# Patient Record
Sex: Female | Born: 1935 | Race: White | Hispanic: No | Smoking: Never smoker
Health system: Southern US, Community
[De-identification: ages and names within clinical notes are randomized; demographics above are authoritative.]

## PROBLEM LIST (undated history)

## (undated) DIAGNOSIS — M1990 Unspecified osteoarthritis, unspecified site: Secondary | ICD-10-CM

## (undated) DIAGNOSIS — I10 Essential (primary) hypertension: Secondary | ICD-10-CM

## (undated) HISTORY — PX: ABDOMINAL HYSTERECTOMY: SHX81

---

## 2006-12-25 ENCOUNTER — Inpatient Hospital Stay (HOSPITAL_COMMUNITY): Admission: EM | Admit: 2006-12-25 | Discharge: 2006-12-29 | Payer: Self-pay | Admitting: Emergency Medicine

## 2006-12-25 ENCOUNTER — Encounter: Payer: Self-pay | Admitting: Emergency Medicine

## 2009-10-04 ENCOUNTER — Ambulatory Visit (HOSPITAL_COMMUNITY): Admission: RE | Admit: 2009-10-04 | Discharge: 2009-10-04 | Payer: Self-pay | Admitting: Family Medicine
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
## 2010-12-04 ENCOUNTER — Other Ambulatory Visit (HOSPITAL_COMMUNITY): Payer: Self-pay | Admitting: Internal Medicine

## 2010-12-04 DIAGNOSIS — Z139 Encounter for screening, unspecified: Secondary | ICD-10-CM

## 2010-12-10 ENCOUNTER — Ambulatory Visit (HOSPITAL_COMMUNITY): Payer: Self-pay

## 2011-04-09 LAB — CBC
HCT: 36.3
Hemoglobin: 12.7
MCHC: 33.8
MCV: 91.4
MCV: 91.8
Platelets: 251
Platelets: 290
RBC: 3.97
RBC: 4.07
RBC: 4.16
RDW: 12.4
RDW: 12.6
RDW: 12.8
WBC: 12.1 — ABNORMAL HIGH
WBC: 17 — ABNORMAL HIGH

## 2011-04-09 LAB — BASIC METABOLIC PANEL
BUN: 14
CO2: 22
CO2: 24
Calcium: 8.8
Calcium: 9
Chloride: 104
Chloride: 109
Creatinine, Ser: 0.79
GFR calc Af Amer: >60
GFR calc Af Amer: >60
GFR calc non Af Amer: >60
GFR calc non Af Amer: >60
Glucose, Bld: 283 — ABNORMAL HIGH
Glucose, Bld: 353 — ABNORMAL HIGH
Potassium: 4.5
Potassium: 4.9
Sodium: 134 — ABNORMAL LOW
Sodium: 136

## 2011-04-09 LAB — DIFFERENTIAL
Basophils Absolute: 0.2 — ABNORMAL HIGH
Basophils Relative: 1
Eosinophils Absolute: 0.6
Eosinophils Relative: 0
Eosinophils Relative: 5
Lymphocytes Relative: 17
Lymphs Abs: 0.7
Lymphs Abs: 2
Lymphs Abs: 2.3
Monocytes Absolute: 0.7
Monocytes Relative: 6
Neutrophils Relative %: 74

## 2011-08-15 ENCOUNTER — Encounter (HOSPITAL_COMMUNITY): Payer: Self-pay | Admitting: *Deleted

## 2011-08-15 ENCOUNTER — Emergency Department (HOSPITAL_COMMUNITY): Payer: Medicare Other

## 2011-08-15 ENCOUNTER — Other Ambulatory Visit: Payer: Self-pay

## 2011-08-15 ENCOUNTER — Emergency Department (HOSPITAL_COMMUNITY)
Admission: EM | Admit: 2011-08-15 | Discharge: 2011-08-15 | Disposition: A | Discharge status: Home / Self Care | Payer: Medicare Other | Attending: Emergency Medicine | Admitting: Emergency Medicine

## 2011-08-15 DIAGNOSIS — S20219A Contusion of unspecified front wall of thorax, initial encounter: Secondary | ICD-10-CM | POA: Insufficient documentation

## 2011-08-15 DIAGNOSIS — I10 Essential (primary) hypertension: Secondary | ICD-10-CM | POA: Insufficient documentation

## 2011-08-15 DIAGNOSIS — M129 Arthropathy, unspecified: Secondary | ICD-10-CM | POA: Insufficient documentation

## 2011-08-15 DIAGNOSIS — M542 Cervicalgia: Secondary | ICD-10-CM | POA: Insufficient documentation

## 2011-08-15 DIAGNOSIS — R51 Headache: Secondary | ICD-10-CM | POA: Insufficient documentation

## 2011-08-15 DIAGNOSIS — E119 Type 2 diabetes mellitus without complications: Secondary | ICD-10-CM | POA: Insufficient documentation

## 2011-08-15 HISTORY — DX: Unspecified osteoarthritis, unspecified site: M19.90

## 2011-08-15 HISTORY — DX: Essential (primary) hypertension: I10

## 2011-08-15 LAB — DIFFERENTIAL
Basophils Absolute: 0.1 10*3/uL (ref 0.0–0.1)
Basophils Relative: 0 % (ref 0–1)
Eosinophils Relative: 3 % (ref 0–5)
Lymphocytes Relative: 9 % — ABNORMAL LOW (ref 12–46)
Neutro Abs: 13.2 10*3/uL — ABNORMAL HIGH (ref 1.7–7.7)
Neutrophils Relative %: 82 % — ABNORMAL HIGH (ref 43–77)

## 2011-08-15 LAB — COMPREHENSIVE METABOLIC PANEL
ALT: 20 U/L (ref 0–35)
Albumin: 3.8 g/dL (ref 3.5–5.2)
Alkaline Phosphatase: 75 U/L (ref 39–117)
CO2: 26 mEq/L (ref 19–32)
Creatinine, Ser: 0.75 mg/dL (ref 0.50–1.10)
GFR calc Af Amer: >90 mL/min (ref >90)
GFR calc non Af Amer: 81 mL/min — ABNORMAL LOW (ref >90)
Glucose, Bld: 192 mg/dL — ABNORMAL HIGH (ref 70–99)
Potassium: 3.9 mEq/L (ref 3.5–5.1)
Sodium: 138 mEq/L (ref 135–145)
Total Bilirubin: 0.4 mg/dL (ref 0.3–1.2)

## 2011-08-15 LAB — TROPONIN I: Troponin I: <0.3 ng/mL (ref <0.30)

## 2011-08-15 LAB — CBC
HCT: 36.8 % (ref 36.0–46.0)
Hemoglobin: 12.2 g/dL (ref 12.0–15.0)
Platelets: 256 10*3/uL (ref 150–400)
WBC: 16.2 10*3/uL — ABNORMAL HIGH (ref 4.0–10.5)

## 2011-08-15 MED ORDER — HYDROCODONE-ACETAMINOPHEN 5-325 MG PO TABS: 1.0000 | ORAL_TABLET | Freq: Four times a day (QID) | ORAL | Status: AC | PRN

## 2011-08-15 MED ORDER — MORPHINE SULFATE 2 MG/ML IJ SOLN
2.0000 mg | Freq: Once | INTRAMUSCULAR | Status: AC
  Administered 2011-08-15: 2 mg via INTRAVENOUS
  Filled 2011-08-15: qty 1

## 2011-08-15 MED ORDER — IOHEXOL 300 MG/ML  SOLN
80.0000 mL | Freq: Once | INTRAMUSCULAR | Status: AC | PRN
  Administered 2011-08-15: 80 mL via INTRAVENOUS

## 2011-08-15 NOTE — ED Notes (Signed)
Pt over to x ray.

## 2011-08-15 NOTE — Discharge Instructions (Signed)
Follow up with your md next week for recheck.  Have him follow up on your thyroid gland
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
## 2011-08-15 NOTE — ED Notes (Signed)
Pt stable at discharge.  

## 2011-08-15 NOTE — ED Notes (Signed)
Received report Agree with previous assessment; Pt c/o pain across chest area rating at 10 will inform MD
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
## 2011-08-15 NOTE — ED Notes (Signed)
Family member stated they found the patient's watch stuck under the mattress in the bed. Patient was wearing gold watch upon discharge from ER.

## 2011-08-15 NOTE — ED Notes (Signed)
Patient family member stated they thought she had left her watch in X-Ray. Called xray and they stated they left the watch laying on top of the patient during transport back to room. Searched bed and room with family member, unable to locate watch. Retraced steps back to xray but did not see watch in floor. Advised family member.

## 2011-09-02 ENCOUNTER — Other Ambulatory Visit (HOSPITAL_COMMUNITY): Payer: Self-pay | Admitting: Family Medicine

## 2011-09-02 DIAGNOSIS — E079 Disorder of thyroid, unspecified: Secondary | ICD-10-CM

## 2011-09-10 ENCOUNTER — Ambulatory Visit (HOSPITAL_COMMUNITY)
Admission: RE | Admit: 2011-09-10 | Discharge: 2011-09-10 | Disposition: A | Discharge status: Home / Self Care | Payer: Medicare Other | Source: Ambulatory Visit | Attending: Family Medicine | Admitting: Family Medicine

## 2011-09-10 DIAGNOSIS — E049 Nontoxic goiter, unspecified: Secondary | ICD-10-CM | POA: Insufficient documentation

## 2011-09-10 DIAGNOSIS — E079 Disorder of thyroid, unspecified: Secondary | ICD-10-CM | POA: Insufficient documentation

## 2011-09-26 ENCOUNTER — Other Ambulatory Visit (HOSPITAL_COMMUNITY): Payer: Self-pay | Admitting: "Endocrinology

## 2011-09-26 DIAGNOSIS — E042 Nontoxic multinodular goiter: Secondary | ICD-10-CM

## 2011-09-30 ENCOUNTER — Encounter (HOSPITAL_COMMUNITY): Admission: RE | Admit: 2011-09-30 | Payer: Medicare Other | Source: Ambulatory Visit

## 2011-10-01 ENCOUNTER — Encounter (HOSPITAL_COMMUNITY): Payer: Medicare Other

## 2011-10-03 ENCOUNTER — Encounter (HOSPITAL_COMMUNITY)
Admission: RE | Admit: 2011-10-03 | Discharge: 2011-10-03 | Disposition: A | Discharge status: Home / Self Care | Payer: Medicare Other | Source: Ambulatory Visit | Attending: "Endocrinology | Admitting: "Endocrinology

## 2011-10-03 ENCOUNTER — Encounter (HOSPITAL_COMMUNITY): Payer: Self-pay

## 2011-10-03 DIAGNOSIS — E042 Nontoxic multinodular goiter: Secondary | ICD-10-CM

## 2011-10-03 MED ORDER — SODIUM IODIDE I 131 CAPSULE
17.0000 | Freq: Once | INTRAVENOUS | Status: AC | PRN
  Administered 2011-10-03: 17 via ORAL

## 2011-10-04 ENCOUNTER — Encounter (HOSPITAL_COMMUNITY)
Admission: RE | Admit: 2011-10-04 | Discharge: 2011-10-04 | Disposition: A | Discharge status: Home / Self Care | Payer: Medicare Other | Source: Ambulatory Visit | Attending: "Endocrinology | Admitting: "Endocrinology

## 2011-10-04 MED ORDER — SODIUM PERTECHNETATE TC 99M INJECTION
10.0000 | Freq: Once | INTRAVENOUS | Status: AC | PRN
  Administered 2011-10-04: 10 via INTRAVENOUS

## 2011-10-08 ENCOUNTER — Other Ambulatory Visit (HOSPITAL_COMMUNITY): Payer: Self-pay | Admitting: "Endocrinology

## 2011-10-08 DIAGNOSIS — C540 Malignant neoplasm of isthmus uteri: Secondary | ICD-10-CM

## 2011-10-15 ENCOUNTER — Ambulatory Visit (HOSPITAL_COMMUNITY): Admission: RE | Admit: 2011-10-15 | Payer: Medicare Other | Source: Ambulatory Visit

## 2011-10-18 ENCOUNTER — Other Ambulatory Visit (HOSPITAL_COMMUNITY): Payer: Self-pay | Admitting: "Endocrinology

## 2011-10-18 ENCOUNTER — Ambulatory Visit (HOSPITAL_COMMUNITY)
Admission: RE | Admit: 2011-10-18 | Discharge: 2011-10-18 | Disposition: A | Discharge status: Home / Self Care | Payer: Medicare Other | Source: Ambulatory Visit | Attending: "Endocrinology | Admitting: "Endocrinology

## 2011-10-18 VITALS — BP 190/91 | HR 84 | Temp 97.5°F | Resp 16

## 2011-10-18 DIAGNOSIS — C540 Malignant neoplasm of isthmus uteri: Secondary | ICD-10-CM

## 2011-10-18 DIAGNOSIS — E049 Nontoxic goiter, unspecified: Secondary | ICD-10-CM | POA: Insufficient documentation

## 2011-10-18 NOTE — Progress Notes (Signed)
Lidocaine 2%       3mL injected 

## 2013-03-14 ENCOUNTER — Emergency Department (HOSPITAL_COMMUNITY)
Admission: EM | Admit: 2013-03-14 | Discharge: 2013-03-14 | Disposition: A | Discharge status: Home / Self Care | Payer: Medicare Other | Attending: Emergency Medicine | Admitting: Emergency Medicine

## 2013-03-14 ENCOUNTER — Encounter (HOSPITAL_COMMUNITY): Payer: Self-pay | Admitting: *Deleted

## 2013-03-14 ENCOUNTER — Emergency Department (HOSPITAL_COMMUNITY): Payer: Medicare Other

## 2013-03-14 DIAGNOSIS — Y929 Unspecified place or not applicable: Secondary | ICD-10-CM | POA: Insufficient documentation

## 2013-03-14 DIAGNOSIS — I10 Essential (primary) hypertension: Secondary | ICD-10-CM | POA: Insufficient documentation

## 2013-03-14 DIAGNOSIS — M129 Arthropathy, unspecified: Secondary | ICD-10-CM | POA: Insufficient documentation

## 2013-03-14 DIAGNOSIS — E119 Type 2 diabetes mellitus without complications: Secondary | ICD-10-CM | POA: Insufficient documentation

## 2013-03-14 DIAGNOSIS — Y9301 Activity, walking, marching and hiking: Secondary | ICD-10-CM | POA: Insufficient documentation

## 2013-03-14 DIAGNOSIS — Z79899 Other long term (current) drug therapy: Secondary | ICD-10-CM | POA: Insufficient documentation

## 2013-03-14 DIAGNOSIS — S8002XA Contusion of left knee, initial encounter: Secondary | ICD-10-CM

## 2013-03-14 DIAGNOSIS — S8000XA Contusion of unspecified knee, initial encounter: Secondary | ICD-10-CM | POA: Insufficient documentation

## 2013-03-14 DIAGNOSIS — W010XXA Fall on same level from slipping, tripping and stumbling without subsequent striking against object, initial encounter: Secondary | ICD-10-CM | POA: Insufficient documentation

## 2013-03-14 NOTE — ED Notes (Signed)
Detailed discharge instructions given to the pt and son. Verbalized understanding of all, on scripts given. To car in a wheelchair.

## 2013-03-14 NOTE — ED Notes (Signed)
Pt states that she was walking to the bathroom yesterday evening and fell, pt unsure of what caused her to fall, she had notified family about the fall this am, pt c/o pain to left knee area.

## 2013-03-14 NOTE — ED Provider Notes (Signed)
CSN: [id number]     Arrival date & time 03/14/13  1411 History   This chart was scribed for Hurman Horn, MD by Caryn Bee, ED Scribe. This patient was seen in room APA04/APA04 and the patient's care was started 3:30 PM.    Chief Complaint  Patient presents with  . Fall  . Knee Pain   The history is provided by the patient. No language interpreter was used.   HPI Comments: Yolanda Solis is a 79 y.o. female who presents to the Emergency Department complaining of constant, moderate left knee pain that occurred after a fall yesterday. Pt states that she either tripped or lost her balance while walking to the bathroom, causing the fall. She denies LOC, lightheadedness, dizziness, HA, chest pain, SOB, amnesia, vertigo, focal or lateralizing weakness or numbness pertaining to the fall. Pt lives alone at baseline. She uses a cane at baseline when walking outside of her home, and uses a walker as needed. Pt denies hip pain, back pain, head injury, or neck pain. Pt states she is walking with a limp today. She takes hydrocodone as needed for sciatica pain, but has not taken any medication for pain today.    Past Medical History  Diagnosis Date  . Diabetes mellitus   . Hypertension   . Arthritis    Past Surgical History  Procedure Laterality Date  . Abdominal hysterectomy     No family history on file. History  Substance Use Topics  . Smoking status: Never Smoker   . Smokeless tobacco: Not on file  . Alcohol Use: No   OB History   Grav Para Term Preterm Abortions TAB SAB Ect Mult Living                 Review of Systems 10 Systems reviewed and are negative for acute change except as noted in the HPI.  Allergies  Review of patient's allergies indicates no known allergies.  Home Medications   Current Outpatient Rx  Name  Route  Sig  Dispense  Refill  . glipiZIDE (GLUCOTROL) 10 MG tablet   Oral   Take 10 mg by mouth every morning.         Marland Kitchen HYDROcodone-acetaminophen  (NORCO/VICODIN) 5-325 MG per tablet   Oral   Take 1 tablet by mouth 2 (two) times daily.         Marland Kitchen lisinopril (PRINIVIL,ZESTRIL) 5 MG tablet   Oral   Take 5 mg by mouth daily.         Marland Kitchen loratadine (ALLERGY RELIEF) 10 MG tablet   Oral   Take 10 mg by mouth daily.         . metFORMIN (GLUCOPHAGE) 500 MG tablet   Oral   Take 500 mg by mouth 3 (three) times daily.          Marland Kitchen ibuprofen (ADVIL,MOTRIN) 200 MG tablet   Oral   Take 200 mg by mouth as needed. For pain          Triage Vitals: BP 182/71  Pulse 87  Temp(Src) 98.6 F (37 C) (Oral)  Resp 16  SpO2 100%  Physical Exam  Nursing note and vitals reviewed. Constitutional:  Awake, alert, nontoxic appearance with baseline speech for patient.  HENT:  Head: Atraumatic.  Mouth/Throat: No oropharyngeal exudate.  Eyes: EOM are normal. Pupils are equal, round, and reactive to light. Right eye exhibits no discharge. Left eye exhibits no discharge.  Neck: Neck supple.   Cervical spine non  tender.   Cardiovascular: Normal rate and regular rhythm.   No murmur heard.  Left foot DP pulse intact. CR less than 2 seconds in left toes.   Pulmonary/Chest: Effort normal and breath sounds normal. No stridor. No respiratory distress. She has no wheezes. She has no rales. She exhibits no tenderness.  Abdominal: Soft. Bowel sounds are normal. She exhibits no mass. There is no tenderness. There is no rebound.  Musculoskeletal: She exhibits no tenderness.  Baseline ROM, moves extremities with no obvious new focal weakness.Back non tender. Pelvis and hips non tender. Bilateral arms and right leg non tender. Left hip is non tender with baseline ROM. Left ankle and foot non tender. Left foot normal light touch. Good movement in left foot. Left thigh, left calf non tender. Left knee full extension, thigh and calf non tender. Left knee has bruising, swelling and tenderness anteriorly just inferior to the patella. Decreased flexion due to anterior  knee pain. Left posterior, medial and lateral knee non tender. Left knee stable to varus and valgus stress. Left knee negative Lachman's. Negative McMurray's.   Lymphadenopathy:    She has no cervical adenopathy.  Neurological:  Awake, alert, cooperative and aware of situation; motor strength bilaterally; sensation normal to light touch bilaterally; peripheral visual fields full to confrontation; no facial asymmetry; tongue midline; major cranial nerves appear intact; no pronator drift, normal finger to nose bilaterally, Pt's family reports today has baseline gait without new ataxia.  Skin: No rash noted.  Psychiatric: She has a normal mood and affect.    ED Course  Procedures (including critical care time) DIAGNOSTIC STUDIES: Oxygen Saturation is 100% on room air, normal by my interpretation.    COORDINATION OF CARE: 3:35 PM- Pt advised of plan to receive an X-ray of her left knee. Will apply a knee immobilizer. Pt agrees.  4:25 PM- Pt advised of radiology findings indicating no fractures. Patient / Family / Caregiver informed of clinical course, understand medical decision-making process, and agree with plan. Labs Review Labs Reviewed - No data to display Imaging Review Dg Knee Complete 4 Views Left  03/14/2013   CLINICAL DATA:  Knee pain. Fall  EXAM: LEFT KNEE - COMPLETE 4+ VIEW  COMPARISON:  None  FINDINGS: Prepatellar soft tissue swelling noted.  There is no evidence for acute fracture or subluxation. Tricompartmental osteoarthritis is noted. There is joint space narrowing subchondral sclerosis and marginal spur formation. Vascular calcifications are identified.  IMPRESSION: 1. Osteoarthritis.   Electronically Signed   By: Signa Kell M.D.   On: 03/14/2013 16:10    MDM   1. Knee contusion, left, initial encounter    I doubt any other EMC precluding discharge at this time including, but not necessarily limited to the following:hip injury.  I personally performed the services  described in this documentation, which was scribed in my presence. The recorded information has been reviewed and is accurate.    Hurman Horn, MD 03/15/13 [phone]

## 2013-12-08 IMAGING — US US SOFT TISSUE HEAD/NECK
1 series · 13 of 25 positions shown · non-contrast
Comparison: 08/15/2011 c t neck.  No comparison thyroid
ultrasound.

CLINICAL DATA: Unspecified thyroid disorder.

THYROID ULTRASOUND
TECHNIQUE: Ultrasound examination of the thyroid gland and adjacent
soft tissues was performed.

[Series 1: us soft tissue head/neck · 0.07mm/px · 13 of 49 slices shown]
[im 1/49]
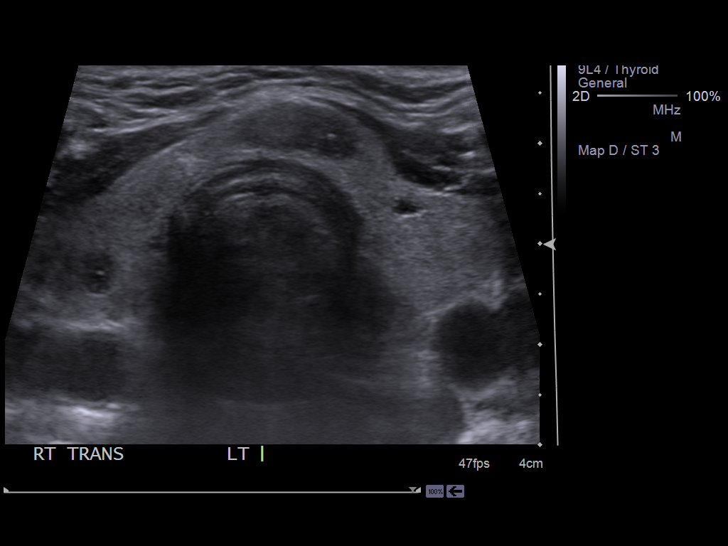
[im 5/49]
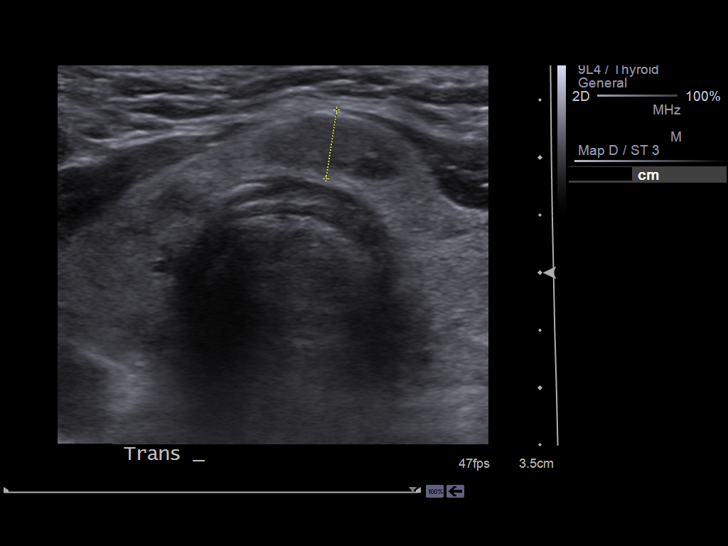
[im 9/49]
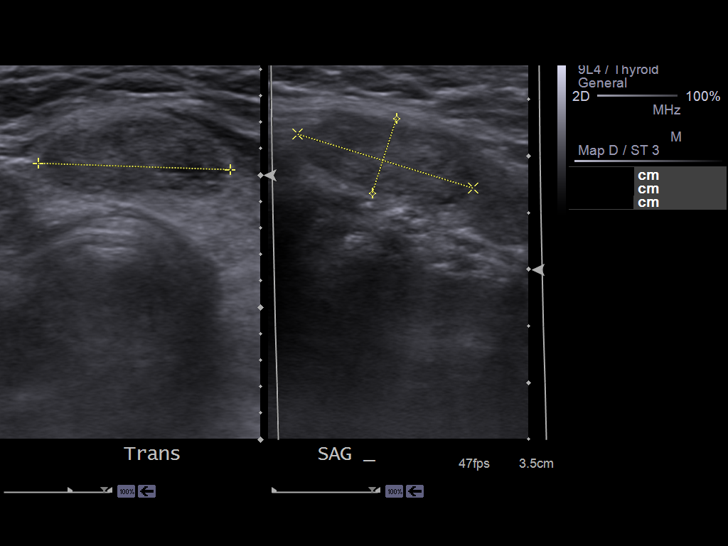
[im 13/49]
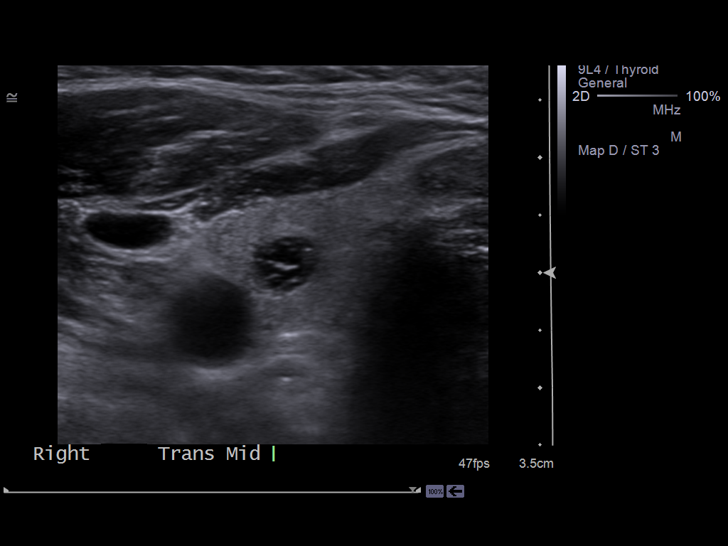
[im 17/49]
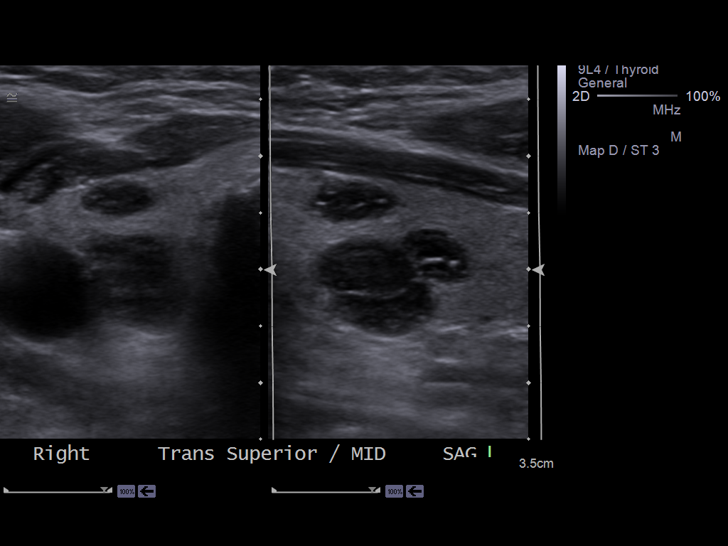
[im 21/49]
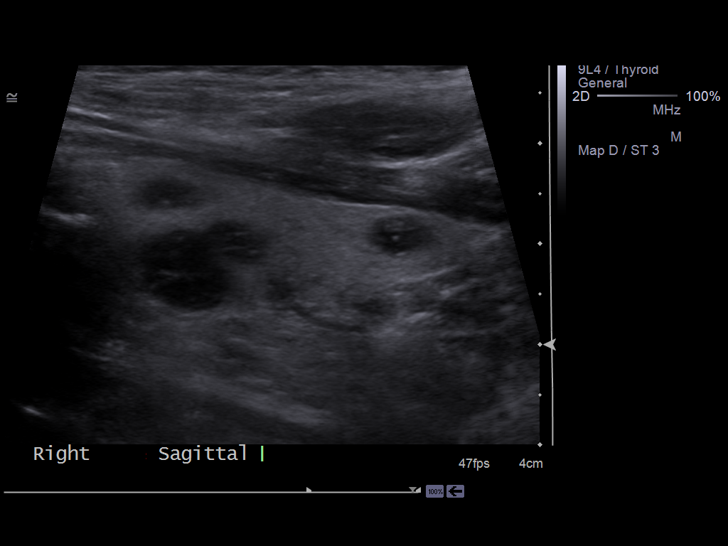
[im 25/49]
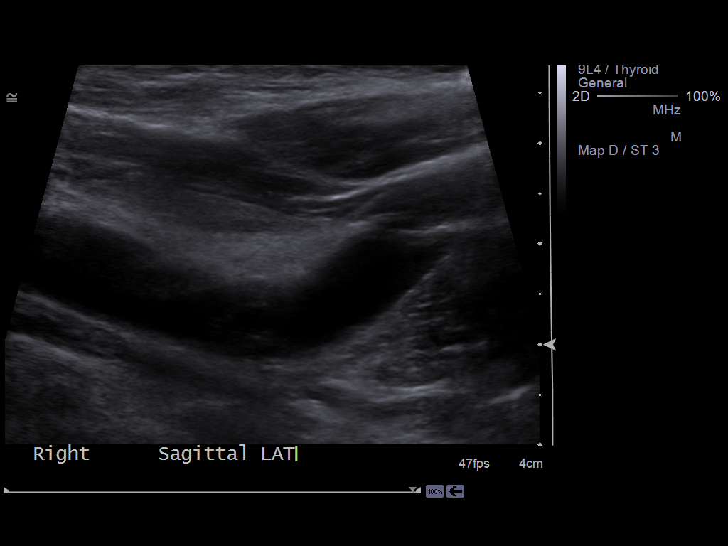
[im 29/49]
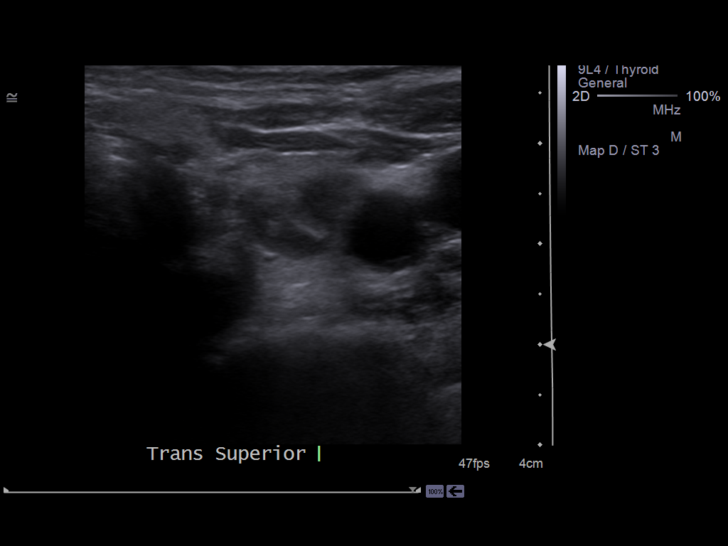
[im 33/49]
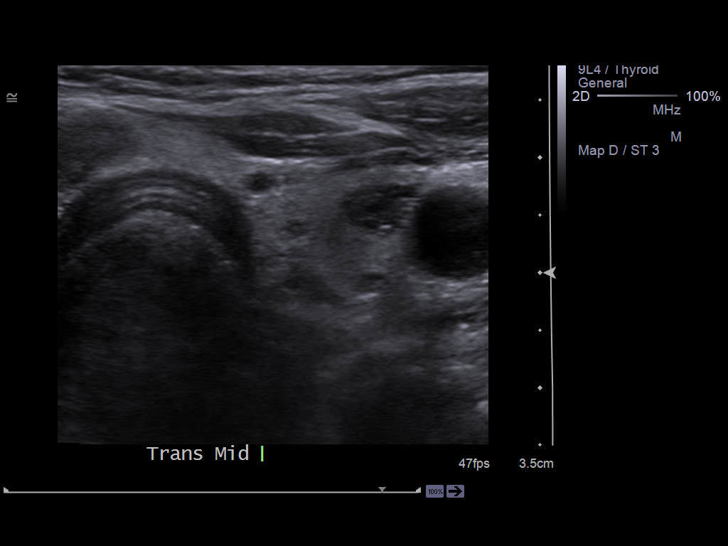
[im 37/49]
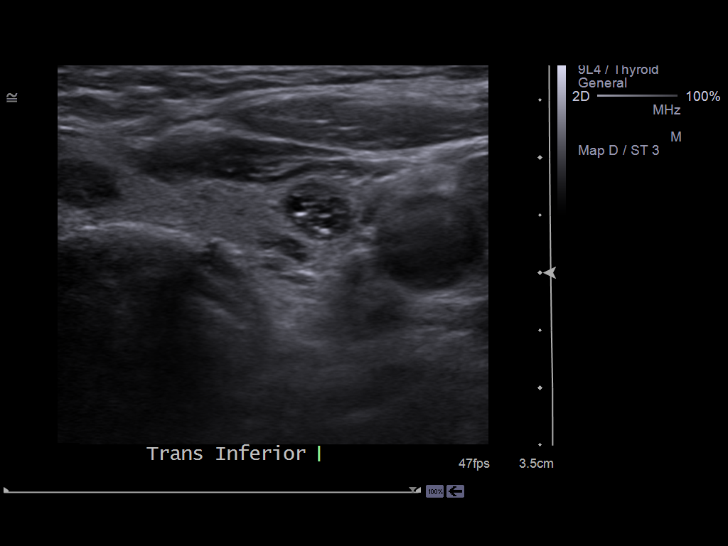
[im 41/49]
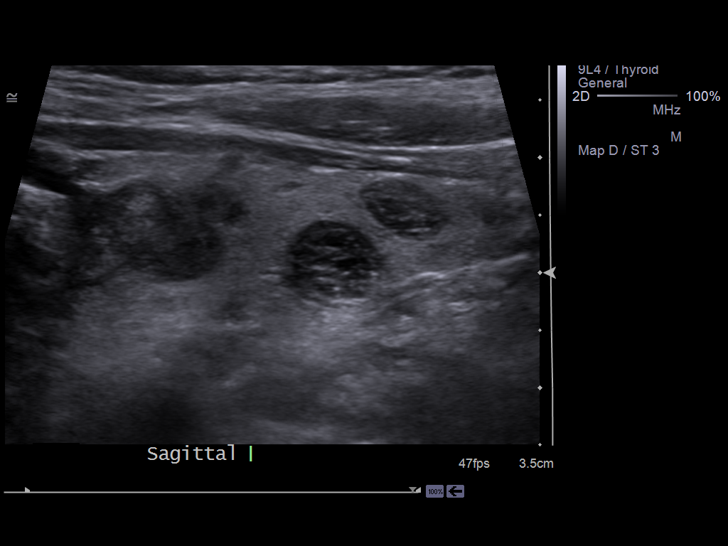
[im 45/49]
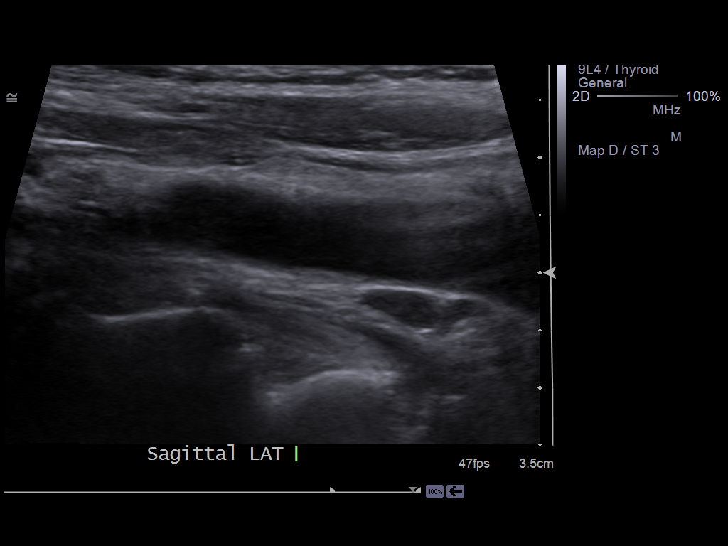
[im 49/49]
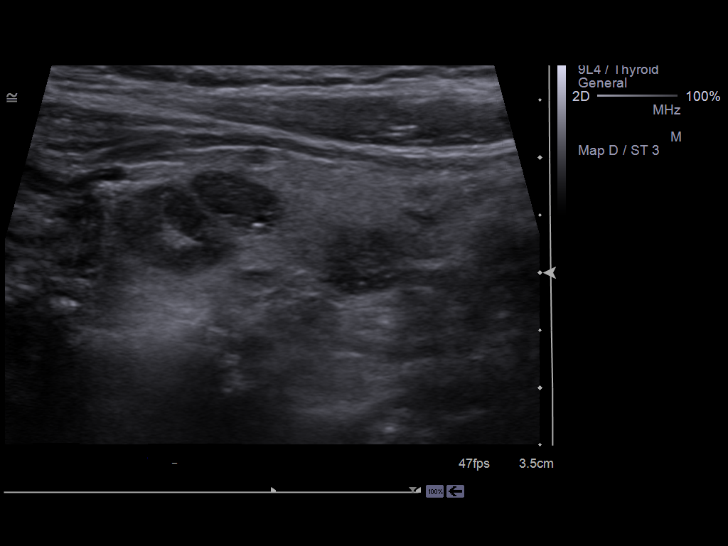

[13 of 25 positions shown; findings below may reference images not displayed]

FINDINGS: Right thyroid lobe:  4.3 x 1.4 x 1.6 cm.
Left thyroid lobe:  3.7 x 1.4 x 1.6 cm.
Isthmus:  0.61 cm.

Focal nodules:  Within the right lobe of the thyroid gland, there
are two lesions.  Upper pole complex partially cystic 0.8 x 0.4 x
0.6 cm lesion.  Below this level is a complex partially septated
and partially cystic appearing 1.1 x 0.9 x 0.9 cm lesion.  Adjacent
to this nodule is a sub centimeter partially cystic complex
structure possibly part of the adjacent nodule.

Within the isthmus, solid masses possibly containing tiny
calcifications.  To the right of midline nodule measures 1.5 x
x 1.4 cm.  To the left of midline, nodule measures 1.6 x 8 and
x 1.5 cm.

Within the left lobe, upper pole complex partially cystic/solid
lesion which may represent two lesions side-by-side versus one
dominant lesion containing small calcifications measuring 1.1 x
x 1.2 cm.  Within the lower pole of the left thyroid complex
cystic/solid structure possibly containing small calcifications
measures 0.9 x 0.7 x 0.6 cm.

Smaller nodules not measured.

Lymphadenopathy:  None visualized.
IMPRESSION: Multiple nodules throughout the thyroid gland with the largest
solid-appearing nodules within the isthmus.  Fine-needle aspirate
may be considered to help exclude malignancy.

## 2014-02-16 ENCOUNTER — Emergency Department (HOSPITAL_COMMUNITY): Payer: Medicare Other

## 2014-02-16 ENCOUNTER — Encounter (HOSPITAL_COMMUNITY): Payer: Self-pay | Admitting: Emergency Medicine

## 2014-02-16 ENCOUNTER — Emergency Department (HOSPITAL_COMMUNITY)
Admission: EM | Admit: 2014-02-16 | Discharge: 2014-02-16 | Disposition: A | Discharge status: Home / Self Care | Payer: Medicare Other | Source: Home / Self Care | Attending: Emergency Medicine | Admitting: Emergency Medicine

## 2014-02-16 DIAGNOSIS — Z791 Long term (current) use of non-steroidal anti-inflammatories (NSAID): Secondary | ICD-10-CM | POA: Insufficient documentation

## 2014-02-16 DIAGNOSIS — S20229A Contusion of unspecified back wall of thorax, initial encounter: Secondary | ICD-10-CM | POA: Insufficient documentation

## 2014-02-16 DIAGNOSIS — Y92009 Unspecified place in unspecified non-institutional (private) residence as the place of occurrence of the external cause: Secondary | ICD-10-CM | POA: Insufficient documentation

## 2014-02-16 DIAGNOSIS — Z79899 Other long term (current) drug therapy: Secondary | ICD-10-CM | POA: Insufficient documentation

## 2014-02-16 DIAGNOSIS — E119 Type 2 diabetes mellitus without complications: Secondary | ICD-10-CM

## 2014-02-16 DIAGNOSIS — W1809XA Striking against other object with subsequent fall, initial encounter: Secondary | ICD-10-CM | POA: Insufficient documentation

## 2014-02-16 DIAGNOSIS — M129 Arthropathy, unspecified: Secondary | ICD-10-CM | POA: Insufficient documentation

## 2014-02-16 DIAGNOSIS — IMO0002 Reserved for concepts with insufficient information to code with codable children: Secondary | ICD-10-CM | POA: Insufficient documentation

## 2014-02-16 DIAGNOSIS — Y9389 Activity, other specified: Secondary | ICD-10-CM | POA: Insufficient documentation

## 2014-02-16 DIAGNOSIS — I10 Essential (primary) hypertension: Secondary | ICD-10-CM

## 2014-02-16 DIAGNOSIS — S300XXA Contusion of lower back and pelvis, initial encounter: Secondary | ICD-10-CM

## 2014-02-16 MED ORDER — FENTANYL CITRATE 0.05 MG/ML IJ SOLN
75.0000 ug | Freq: Once | INTRAMUSCULAR | Status: AC
  Administered 2014-02-16: 75 ug via INTRAMUSCULAR
  Filled 2014-02-16: qty 2

## 2014-02-16 MED ORDER — HYDROMORPHONE HCL PF 1 MG/ML IJ SOLN
1.0000 mg | Freq: Once | INTRAMUSCULAR | Status: AC
  Administered 2014-02-16: 1 mg via INTRAMUSCULAR
  Filled 2014-02-16: qty 1

## 2014-02-16 MED ORDER — OXYCODONE-ACETAMINOPHEN 5-325 MG PO TABS: 1.0000 | ORAL_TABLET | ORAL | Status: DC | PRN

## 2014-02-16 MED ORDER — IBUPROFEN 400 MG PO TABS
400.0000 mg | ORAL_TABLET | Freq: Once | ORAL | Status: AC
  Administered 2014-02-16: 400 mg via ORAL
  Filled 2014-02-16: qty 1

## 2014-02-16 MED ORDER — DIAZEPAM 5 MG PO TABS: 2.5000 mg | ORAL_TABLET | Freq: Three times a day (TID) | ORAL | Status: DC | PRN

## 2014-02-16 MED ORDER — DIAZEPAM 5 MG PO TABS
2.5000 mg | ORAL_TABLET | Freq: Once | ORAL | Status: AC
  Administered 2014-02-16: 2.5 mg via ORAL
  Filled 2014-02-16: qty 1

## 2014-02-16 MED ORDER — DOCUSATE SODIUM 100 MG PO CAPS: 100.0000 mg | ORAL_CAPSULE | Freq: Two times a day (BID) | ORAL | Status: AC
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
## 2014-02-16 NOTE — ED Notes (Signed)
Patient given discharge instruction, verbalized understand.  Patient in wheelchair out of the department.  

## 2014-02-16 NOTE — ED Notes (Signed)
unwitnessed fall at home; a & o x 4 per ems. Pt states lower back hit object in home, no head injury. Pt states pain level "really hurts" NAD noted.

## 2014-02-17 ENCOUNTER — Encounter (HOSPITAL_COMMUNITY)
Admission: EM | Disposition: A | Discharge status: Rehabilitation Facility | Payer: Self-pay | Source: Home / Self Care | Attending: Neurosurgery

## 2014-02-17 ENCOUNTER — Inpatient Hospital Stay (HOSPITAL_COMMUNITY)
Admission: EM | Admit: 2014-02-17 | Discharge: 2014-02-23 | DRG: 028 | Disposition: A | Discharge status: Rehabilitation Facility | Payer: Medicare Other | Attending: Neurosurgery | Admitting: Neurosurgery

## 2014-02-17 ENCOUNTER — Emergency Department (HOSPITAL_COMMUNITY): Payer: Medicare Other

## 2014-02-17 ENCOUNTER — Encounter (HOSPITAL_COMMUNITY): Payer: Self-pay | Admitting: Emergency Medicine

## 2014-02-17 DIAGNOSIS — W19XXXA Unspecified fall, initial encounter: Secondary | ICD-10-CM | POA: Diagnosis present

## 2014-02-17 DIAGNOSIS — G9340 Encephalopathy, unspecified: Secondary | ICD-10-CM | POA: Diagnosis not present

## 2014-02-17 DIAGNOSIS — K592 Neurogenic bowel, not elsewhere classified: Secondary | ICD-10-CM | POA: Diagnosis present

## 2014-02-17 DIAGNOSIS — IMO0002 Reserved for concepts with insufficient information to code with codable children: Secondary | ICD-10-CM | POA: Diagnosis present

## 2014-02-17 DIAGNOSIS — I10 Essential (primary) hypertension: Secondary | ICD-10-CM | POA: Diagnosis present

## 2014-02-17 DIAGNOSIS — Z90710 Acquired absence of both cervix and uterus: Secondary | ICD-10-CM | POA: Diagnosis not present

## 2014-02-17 DIAGNOSIS — D72829 Elevated white blood cell count, unspecified: Secondary | ICD-10-CM | POA: Diagnosis present

## 2014-02-17 DIAGNOSIS — Y92009 Unspecified place in unspecified non-institutional (private) residence as the place of occurrence of the external cause: Secondary | ICD-10-CM

## 2014-02-17 DIAGNOSIS — Z79899 Other long term (current) drug therapy: Secondary | ICD-10-CM | POA: Diagnosis not present

## 2014-02-17 DIAGNOSIS — S22009A Unspecified fracture of unspecified thoracic vertebra, initial encounter for closed fracture: Secondary | ICD-10-CM | POA: Diagnosis present

## 2014-02-17 DIAGNOSIS — M129 Arthropathy, unspecified: Secondary | ICD-10-CM | POA: Diagnosis present

## 2014-02-17 DIAGNOSIS — IMO0001 Reserved for inherently not codable concepts without codable children: Secondary | ICD-10-CM

## 2014-02-17 DIAGNOSIS — R6510 Systemic inflammatory response syndrome (SIRS) of non-infectious origin without acute organ dysfunction: Secondary | ICD-10-CM | POA: Diagnosis present

## 2014-02-17 DIAGNOSIS — N319 Neuromuscular dysfunction of bladder, unspecified: Secondary | ICD-10-CM | POA: Diagnosis present

## 2014-02-17 DIAGNOSIS — E1149 Type 2 diabetes mellitus with other diabetic neurological complication: Secondary | ICD-10-CM | POA: Diagnosis present

## 2014-02-17 DIAGNOSIS — E119 Type 2 diabetes mellitus without complications: Secondary | ICD-10-CM

## 2014-02-17 DIAGNOSIS — S24109A Unspecified injury at unspecified level of thoracic spinal cord, initial encounter: Secondary | ICD-10-CM

## 2014-02-17 DIAGNOSIS — I4891 Unspecified atrial fibrillation: Secondary | ICD-10-CM | POA: Diagnosis present

## 2014-02-17 DIAGNOSIS — E1142 Type 2 diabetes mellitus with diabetic polyneuropathy: Secondary | ICD-10-CM | POA: Diagnosis present

## 2014-02-17 DIAGNOSIS — S22009K Unspecified fracture of unspecified thoracic vertebra, subsequent encounter for fracture with nonunion: Secondary | ICD-10-CM

## 2014-02-17 DIAGNOSIS — E1165 Type 2 diabetes mellitus with hyperglycemia: Secondary | ICD-10-CM

## 2014-02-17 HISTORY — PX: POSTERIOR LUMBAR FUSION 4 LEVEL: SHX6037

## 2014-02-17 LAB — BASIC METABOLIC PANEL
Anion gap: 18 — ABNORMAL HIGH (ref 5–15)
BUN: 19 mg/dL (ref 6–23)
CO2: 24 meq/L (ref 19–32)
CREATININE: 0.82 mg/dL (ref 0.50–1.10)
Calcium: 9.3 mg/dL (ref 8.4–10.5)
Chloride: 95 mEq/L — ABNORMAL LOW (ref 96–112)
GFR calc Af Amer: 78 mL/min — ABNORMAL LOW (ref >90)
GFR calc non Af Amer: 67 mL/min — ABNORMAL LOW (ref >90)
GLUCOSE: 275 mg/dL — AB (ref 70–99)
Potassium: 3.9 mEq/L (ref 3.7–5.3)
Sodium: 137 mEq/L (ref 137–147)

## 2014-02-17 LAB — CBC WITH DIFFERENTIAL/PLATELET
BASOS ABS: 0 10*3/uL (ref 0.0–0.1)
BASOS PCT: 0 % (ref 0–1)
EOS ABS: 0 10*3/uL (ref 0.0–0.7)
Eosinophils Relative: 0 % (ref 0–5)
HEMATOCRIT: 40.1 % (ref 36.0–46.0)
HEMOGLOBIN: 13.4 g/dL (ref 12.0–15.0)
LYMPHS ABS: 1.4 10*3/uL (ref 0.7–4.0)
Lymphocytes Relative: 7 % — ABNORMAL LOW (ref 12–46)
MCH: 30.6 pg (ref 26.0–34.0)
MCHC: 33.4 g/dL (ref 30.0–36.0)
MCV: 91.6 fL (ref 78.0–100.0)
MONOS PCT: 4 % (ref 3–12)
Monocytes Absolute: 0.8 10*3/uL (ref 0.1–1.0)
NEUTROS ABS: 18 10*3/uL — AB (ref 1.7–7.7)
Neutrophils Relative %: 89 % — ABNORMAL HIGH (ref 43–77)
Platelets: 223 10*3/uL (ref 150–400)
RBC: 4.38 MIL/uL (ref 3.87–5.11)
RDW: 13.4 % (ref 11.5–15.5)
WBC: 20.2 10*3/uL — ABNORMAL HIGH (ref 4.0–10.5)

## 2014-02-17 LAB — URINE MICROSCOPIC-ADD ON

## 2014-02-17 LAB — URINALYSIS, ROUTINE W REFLEX MICROSCOPIC
Bilirubin Urine: NEGATIVE
GLUCOSE, UA: 500 mg/dL — AB
Ketones, ur: 15 mg/dL — AB
Leukocytes, UA: NEGATIVE
Nitrite: NEGATIVE
PROTEIN: 100 mg/dL — AB
Specific Gravity, Urine: >1.03 — ABNORMAL HIGH (ref 1.005–1.030)
Urobilinogen, UA: 0.2 mg/dL (ref 0.0–1.0)
pH: 5 (ref 5.0–8.0)

## 2014-02-17 SURGERY — POSTERIOR LUMBAR FUSION 4 LEVEL
Anesthesia: General | Site: Back

## 2014-02-17 MED ORDER — SODIUM CHLORIDE 0.9 % IV SOLN: INTRAVENOUS | Status: DC | PRN

## 2014-02-17 MED ORDER — ONDANSETRON HCL 4 MG/2ML IJ SOLN
INTRAMUSCULAR | Status: AC
  Filled 2014-02-17: qty 2

## 2014-02-17 MED ORDER — HYDROMORPHONE HCL PF 1 MG/ML IJ SOLN
0.5000 mg | Freq: Once | INTRAMUSCULAR | Status: AC
  Administered 2014-02-17: 0.5 mg via INTRAVENOUS
  Filled 2014-02-17: qty 1

## 2014-02-17 MED ORDER — CETYLPYRIDINIUM CHLORIDE 0.05 % MT LIQD
7.0000 mL | Freq: Two times a day (BID) | OROMUCOSAL | Status: DC
  Administered 2014-02-17 – 2014-02-23 (×12): 7 mL via OROMUCOSAL

## 2014-02-17 MED ORDER — ONDANSETRON HCL 4 MG/2ML IJ SOLN
4.0000 mg | Freq: Once | INTRAMUSCULAR | Status: AC
  Administered 2014-02-17: 4 mg via INTRAVENOUS

## 2014-02-17 MED ORDER — DILTIAZEM HCL 100 MG IV SOLR
5.0000 mg/h | INTRAVENOUS | Status: DC
  Administered 2014-02-17: 5 mg/h via INTRAVENOUS
  Filled 2014-02-17: qty 100

## 2014-02-17 MED ORDER — HYDROMORPHONE HCL PF 1 MG/ML IJ SOLN
1.0000 mg | Freq: Once | INTRAMUSCULAR | Status: AC
  Administered 2014-02-17: 1 mg via INTRAVENOUS
  Filled 2014-02-17: qty 1

## 2014-02-17 SURGICAL SUPPLY — 82 items
BAG DECANTER FOR FLEXI CONT (MISCELLANEOUS) ×3 IMPLANT
BENZOIN TINCTURE PRP APPL 2/3 (GAUZE/BANDAGES/DRESSINGS) IMPLANT
BLADE SURG ROTATE 9660 (MISCELLANEOUS) IMPLANT
BUR MATCHSTICK NEURO 3.0 LAGG (BURR) ×3 IMPLANT
BUR PRECISION FLUTE 5.0 (BURR) ×3 IMPLANT
CANISTER SUCT 3000ML (MISCELLANEOUS) ×3 IMPLANT
CLOSURE WOUND 1/2 X4 (GAUZE/BANDAGES/DRESSINGS) ×1
CONT SPEC 4OZ CLIKSEAL STRL BL (MISCELLANEOUS) ×6 IMPLANT
COVER BACK TABLE 24X17X13 BIG (DRAPES) IMPLANT
COVER TABLE BACK 60X90 (DRAPES) ×3 IMPLANT
DERMABOND ADHESIVE PROPEN (GAUZE/BANDAGES/DRESSINGS) ×4
DERMABOND ADVANCED (GAUZE/BANDAGES/DRESSINGS) ×2
DERMABOND ADVANCED .7 DNX12 (GAUZE/BANDAGES/DRESSINGS) ×1 IMPLANT
DERMABOND ADVANCED .7 DNX6 (GAUZE/BANDAGES/DRESSINGS) ×2 IMPLANT
DRAPE C-ARM 42X72 X-RAY (DRAPES) ×6 IMPLANT
DRAPE LAPAROTOMY 100X72X124 (DRAPES) ×3 IMPLANT
DRAPE POUCH INSTRU U-SHP 10X18 (DRAPES) ×3 IMPLANT
DRAPE SURG 17X23 STRL (DRAPES) ×3 IMPLANT
DRSG OPSITE POSTOP 4X8 (GAUZE/BANDAGES/DRESSINGS) ×3 IMPLANT
DRSG TELFA 3X8 NADH (GAUZE/BANDAGES/DRESSINGS) IMPLANT
DURAPREP 26ML APPLICATOR (WOUND CARE) ×3 IMPLANT
ELECT REM PT RETURN 9FT ADLT (ELECTROSURGICAL) ×3
ELECTRODE REM PT RTRN 9FT ADLT (ELECTROSURGICAL) ×1 IMPLANT
EVACUATOR 1/8 PVC DRAIN (DRAIN) IMPLANT
GAUZE SPONGE 4X4 12PLY STRL (GAUZE/BANDAGES/DRESSINGS) ×3 IMPLANT
GAUZE SPONGE 4X4 16PLY XRAY LF (GAUZE/BANDAGES/DRESSINGS) IMPLANT
GLOVE BIO SURGEON STRL SZ8 (GLOVE) ×6 IMPLANT
GLOVE BIOGEL PI IND STRL 7.5 (GLOVE) ×1 IMPLANT
GLOVE BIOGEL PI IND STRL 8 (GLOVE) IMPLANT
GLOVE BIOGEL PI IND STRL 8.5 (GLOVE) ×2 IMPLANT
GLOVE BIOGEL PI INDICATOR 7.5 (GLOVE) ×2
GLOVE BIOGEL PI INDICATOR 8 (GLOVE)
GLOVE BIOGEL PI INDICATOR 8.5 (GLOVE) ×4
GLOVE ECLIPSE 8.0 STRL XLNG CF (GLOVE) IMPLANT
GLOVE EXAM NITRILE LRG STRL (GLOVE) IMPLANT
GLOVE EXAM NITRILE MD LF STRL (GLOVE) IMPLANT
GLOVE EXAM NITRILE XL STR (GLOVE) IMPLANT
GLOVE EXAM NITRILE XS STR PU (GLOVE) IMPLANT
GLOVE SS N UNI LF 7.5 STRL (GLOVE) ×9 IMPLANT
GOWN STRL REUS W/ TWL LRG LVL3 (GOWN DISPOSABLE) IMPLANT
GOWN STRL REUS W/ TWL XL LVL3 (GOWN DISPOSABLE) ×2 IMPLANT
GOWN STRL REUS W/TWL 2XL LVL3 (GOWN DISPOSABLE) IMPLANT
GOWN STRL REUS W/TWL LRG LVL3 (GOWN DISPOSABLE)
GOWN STRL REUS W/TWL XL LVL3 (GOWN DISPOSABLE) ×4
KIT BASIN OR (CUSTOM PROCEDURE TRAY) ×3 IMPLANT
KIT INFUSE MEDIUM (Orthopedic Implant) ×3 IMPLANT
KIT POSITION SURG JACKSON T1 (MISCELLANEOUS) ×3 IMPLANT
KIT ROOM TURNOVER OR (KITS) ×3 IMPLANT
MILL MEDIUM DISP (BLADE) ×3 IMPLANT
NEEDLE HYPO 25X1 1.5 SAFETY (NEEDLE) ×3 IMPLANT
NEEDLE SPNL 18GX3.5 QUINCKE PK (NEEDLE) IMPLANT
NS IRRIG 1000ML POUR BTL (IV SOLUTION) ×3 IMPLANT
PACK FOAM VITOSS 10CC (Orthopedic Implant) ×6 IMPLANT
PACK LAMINECTOMY NEURO (CUSTOM PROCEDURE TRAY) ×3 IMPLANT
PAD ARMBOARD 7.5X6 YLW CONV (MISCELLANEOUS) ×9 IMPLANT
PATTIES SURGICAL .5 X.5 (GAUZE/BANDAGES/DRESSINGS) IMPLANT
PATTIES SURGICAL .5 X1 (DISPOSABLE) IMPLANT
PATTIES SURGICAL 1X1 (DISPOSABLE) IMPLANT
ROD 200MM (Rod) ×6 IMPLANT
ROD SPNL 200X5.5XNS LF TI (Rod) ×3 IMPLANT
SCREW LOCK (Screw) ×16 IMPLANT
SCREW LOCK 100X5.5X OPN (Screw) ×8 IMPLANT
SCREW POLY 45X6.5 (Screw) ×4 IMPLANT
SCREW POLY 5.5X40MM (Screw) ×6 IMPLANT
SCREW POLY 5.5X45MM (Screw) ×6 IMPLANT
SCREW POLY 6.5X45MM (Screw) ×8 IMPLANT
SPONGE LAP 4X18 X RAY DECT (DISPOSABLE) IMPLANT
SPONGE SURGIFOAM ABS GEL 100 (HEMOSTASIS) ×3 IMPLANT
STAPLER SKIN PROX WIDE 3.9 (STAPLE) IMPLANT
STRIP CLOSURE SKIN 1/2X4 (GAUZE/BANDAGES/DRESSINGS) ×2 IMPLANT
SUT VIC AB 1 CT1 18XBRD ANBCTR (SUTURE) ×2 IMPLANT
SUT VIC AB 1 CT1 8-18 (SUTURE) ×4
SUT VIC AB 2-0 CT1 18 (SUTURE) ×6 IMPLANT
SUT VIC AB 3-0 SH 8-18 (SUTURE) ×6 IMPLANT
SYR 20CC LL (SYRINGE) ×3 IMPLANT
SYR 3ML LL SCALE MARK (SYRINGE) ×12 IMPLANT
SYR 5ML LL (SYRINGE) IMPLANT
TOWEL OR 17X24 6PK STRL BLUE (TOWEL DISPOSABLE) ×3 IMPLANT
TOWEL OR 17X26 10 PK STRL BLUE (TOWEL DISPOSABLE) ×3 IMPLANT
TRAP SPECIMEN MUCOUS 40CC (MISCELLANEOUS) ×3 IMPLANT
TRAY FOLEY CATH 14FRSI W/METER (CATHETERS) ×3 IMPLANT
WATER STERILE IRR 1000ML POUR (IV SOLUTION) ×3 IMPLANT

## 2014-02-17 NOTE — ED Notes (Signed)
ems reports pt was evaluated yesterday for a fall.  Today here for c/o pain and numbness in legs.

## 2014-02-17 NOTE — ED Provider Notes (Signed)
CSN: [id number]     Arrival date & time 02/17/14  1827 History   First MD Initiated Contact with Patient 02/17/14 1906     Chief Complaint  Patient presents with  . Numbness  . Leg Pain     Patient is a 80 y.o. female presenting with back pain. The history is provided by the patient and a relative.  Back Pain Location:  Lumbar spine Quality:  Aching Pain severity:  Severe Onset quality:  Sudden Duration:  1 day Timing:  Constant Progression:  Worsening Chronicity:  Recurrent Context: falling   Relieved by:  Bed rest Worsened by:  Movement Associated symptoms: numbness and weakness   Associated symptoms: no bladder incontinence, no bowel incontinence, no chest pain and no fever   Pt was seen in ED yesterday after mechanical fall.  She reports losing balance and fell and landed on her low back (no head or neck injury, no LOC) She was seen in ED, had negative xray and felt well and was ambulatory Today she noted worsening pain in her low back, and pain was so severe she had difficulty walking or even getting up due to pain EMS was called for transport and she then noticed numbness/weakness in her legs. She also reports brief episode of abdominal pain that has since improved No neck pain. No cp.  She reports at rest her only pain is in her low back   Past Medical History  Diagnosis Date  . Diabetes mellitus   . Hypertension   . Arthritis    Past Surgical History  Procedure Laterality Date  . Abdominal hysterectomy     No family history on file. History  Substance Use Topics  . Smoking status: Never Smoker   . Smokeless tobacco: Not on file  . Alcohol Use: No   OB History   Grav Para Term Preterm Abortions TAB SAB Ect Mult Living                 Review of Systems  Constitutional: Negative for fever.  Cardiovascular: Negative for chest pain.  Gastrointestinal: Negative for vomiting and bowel incontinence.  Genitourinary: Negative for bladder incontinence.   Musculoskeletal: Positive for back pain. Negative for neck pain.  Neurological: Positive for weakness and numbness.  All other systems reviewed and are negative.     Allergies  Review of patient's allergies indicates no known allergies.  Home Medications   Prior to Admission medications   Medication Sig Start Date End Date Taking? Authorizing Provider  diazepam (VALIUM) 5 MG tablet Take 0.5 tablets (2.5 mg total) by mouth every 8 (eight) hours as needed for anxiety. 02/16/14  Yes Virgel Manifold, MD  lisinopril (PRINIVIL,ZESTRIL) 5 MG tablet Take 5 mg by mouth daily.   Yes Historical Provider, MD  loratadine (ALLERGY RELIEF) 10 MG tablet Take 10 mg by mouth daily.   Yes Historical Provider, MD  metFORMIN (GLUCOPHAGE) 500 MG tablet Take 500 mg by mouth 2 (two) times daily with a meal.    Yes Historical Provider, MD  oxyCODONE-acetaminophen (PERCOCET/ROXICET) 5-325 MG per tablet Take 1-2 tablets by mouth every 4 (four) hours as needed for severe pain. 02/16/14  Yes Virgel Manifold, MD  docusate sodium (COLACE) 100 MG capsule Take 1 capsule (100 mg total) by mouth every 12 (twelve) hours. 02/16/14   Virgel Manifold, MD  ibuprofen (ADVIL,MOTRIN) 200 MG tablet Take 400 mg by mouth at bedtime.     Historical Provider, MD   BP 156/102  Pulse 118  Temp(Src) 98.2 F (36.8 C) (Oral)  Resp 20  Ht 5\' 4"  (1.626 m)  Wt 200 lb (90.719 kg)  BMI 34.31 kg/m2  SpO2 88% Physical Exam CONSTITUTIONAL: elderly HEAD: Normocephalic/atraumatic EYES: EOMI/PERRL ENMT: Mucous membranes moist NECK: supple no meningeal signs SPINE: no cervical or thoracic tenderness. Lumbar tenderness noted Patient maintained in spinal precautions/logroll utilized Exam limited due to body habitus CV: irregular, tachycardic LUNGS: Lungs are clear to auscultation bilaterally, no apparent distress ABDOMEN: soft, obese, no focal tenderness or bruising noted NEURO: Pt is awake/alert, no weakness is noted in either upper  extremity She has paralysis of bilateral LE (She is unable to resist gravity when leg is lifted on either side) She has complete sensory deficit to painful stimuli in lower extremities which terminates at suprapubic region She has no saddle anesthesia and +rectal tone EXTREMITIES: pulses normal/equal in femoral/DP pulses no deformity to lower extremities SKIN: warm, color normal PSYCH: anxious  ED Course  Procedures  CRITICAL CARE Performed by: Sharyon Cable Total critical care time: 33 Critical care time was exclusive of separately billable procedures and treating other patients. Critical care was necessary to treat or prevent imminent or life-threatening deterioration. Critical care was time spent personally by me on the following activities: development of treatment plan with patient and/or surrogate as well as nursing, discussions with consultants, evaluation of patient's response to treatment, examination of patient, obtaining history from patient or surrogate, ordering and performing treatments and interventions, ordering and review of laboratory studies, ordering and review of radiographic studies, pulse oximetry and re-evaluation of patient's condition.   9:02 PM Concern for spinal injury since pt with back pain and paralysis of both legs She has gone for emergent MRI of thoracic spine (lumbar spine negative yesterday) 9:48 PM D/w dr Vertell Limber with neurosurgery He has reviewed MRI He will accept in [facility] Requests CT imaging of T/L spine if possible prior to transport No meds requested prior to transport Will keep in TL precautions Pt denies CP.  She denies neck/cervical pain She has no deformity to legs (she had been ambulatory) No other signs of traumatic injury  9:50 PM Pt/family updated on plan Will start cardizem due to afib with RVR (pt was unaware she had this condition) BP 162/98  Pulse 125  Temp(Src) 98.2 F (36.8 C) (Oral)  Resp 18  Ht 5'  4" (1.626 m)  Wt 200 lb (90.719 kg)  BMI 34.31 kg/m2  SpO2 95%   Labs Review Labs Reviewed  BASIC METABOLIC PANEL - Abnormal; Notable for the following:    Chloride 95 (*)    Glucose, Bld 275 (*)    GFR calc non Af Amer 67 (*)    GFR calc Af Amer 78 (*)    Anion gap 18 (*)    All other components within normal limits  CBC WITH DIFFERENTIAL - Abnormal; Notable for the following:    WBC 20.2 (*)    Neutrophils Relative % 89 (*)    Lymphocytes Relative 7 (*)    Neutro Abs 18.0 (*)    All other components within normal limits  URINE CULTURE  URINALYSIS, ROUTINE W REFLEX MICROSCOPIC    Imaging Review Dg Lumbar Spine Complete  02/16/2014   CLINICAL DATA:  80 year old female with low back pain after a fall. Initial encounter.  EXAM: LUMBAR SPINE - COMPLETE 4+ VIEW  COMPARISON:  CT Abdomen and Pelvis 12/25/2006.  FINDINGS: Progressed and bulky diffuse endplate spurring throughout the lumbar spine. Normal  lumbar segmentation. Chronic vacuum disc phenomena at L4-L5. No lumbar compression fracture identified. Grossly stable and intact visualized lower thoracic levels. Moderate lower lumbar facet hypertrophy. No pars fracture identified. Extensive Aortoiliac calcified atherosclerosis noted. Grossly intact sacral ala and SI joints.  IMPRESSION: 1.  No acute fracture or listhesis identified in the lumbar spine. 2. Diffuse idiopathic skeletal hyperostosis 3. Chronically advanced L4-L5 disc degeneration.   Electronically Signed   By: Lars Pinks M.D.   On: 02/16/2014 14:46     EKG Interpretation   Date/Time:  Thursday February 17 2014 21:26:17 EDT Ventricular Rate:  111 PR Interval:    QRS Duration: 80 QT Interval:  366 QTC Calculation: 497 R Axis:   55 Text Interpretation:  Atrial fibrillation Borderline T abnormalities,  inferior leads Borderline prolonged QT interval pvc noted changed from  prior Confirmed by [place] ([id number]) on 02/17/2014 9:47:19 PM      MDM   Final  diagnoses:  Fracture of thoracic vertebra with spinal cord injury, initial encounter  Atrial fibrillation, unspecified    Nursing notes including past medical history and social history reviewed and considered in documentation Labs/vital reviewed and considered Previous records reviewed and considered     Sharyon Cable, MD 02/17/14 2152

## 2014-02-17 NOTE — Anesthesia Preprocedure Evaluation (Addendum)
Anesthesia Evaluation  Patient identified by MRN, date of birth, ID band Patient awake    Reviewed: Allergy & Precautions, H&P , NPO status , Patient's Chart, lab work & pertinent test results  History of Anesthesia Complications Negative for: history of anesthetic complications  Airway Mallampati: I TM Distance: >3 FB Neck ROM: Full    Dental  (+) Edentulous Upper, Edentulous Lower   Pulmonary neg pulmonary ROS,    Pulmonary exam normal       Cardiovascular hypertension, Pt. on medications + dysrhythmias Atrial Fibrillation Rhythm:Irregular Rate:Tachycardia     Neuro/Psych negative psych ROS   GI/Hepatic negative GI ROS, Neg liver ROS,   Endo/Other  diabetes  Renal/GU negative Renal ROS     Musculoskeletal   Abdominal   Peds  Hematology   Anesthesia Other Findings   Reproductive/Obstetrics                          Anesthesia Physical Anesthesia Plan  ASA: III  Anesthesia Plan: General   Post-op Pain Management:    Induction: Intravenous  Airway Management Planned: Oral ETT  Additional Equipment: Arterial line  Intra-op Plan:   Post-operative Plan: Possible Post-op intubation/ventilation  Informed Consent: I have reviewed the patients History and Physical, chart, labs and discussed the procedure including the risks, benefits and alternatives for the proposed anesthesia with the patient or authorized representative who has indicated his/her understanding and acceptance.   Dental advisory given  Plan Discussed with: CRNA and Anesthesiologist  Anesthesia Plan Comments:        Anesthesia Quick Evaluation

## 2014-02-18 ENCOUNTER — Encounter (HOSPITAL_COMMUNITY): Payer: Medicare Other | Admitting: Anesthesiology

## 2014-02-18 ENCOUNTER — Encounter (HOSPITAL_COMMUNITY): Payer: Self-pay | Admitting: Certified Registered"

## 2014-02-18 ENCOUNTER — Inpatient Hospital Stay (HOSPITAL_COMMUNITY): Payer: Medicare Other

## 2014-02-18 ENCOUNTER — Inpatient Hospital Stay (HOSPITAL_COMMUNITY): Payer: Medicare Other | Admitting: Anesthesiology

## 2014-02-18 DIAGNOSIS — IMO0001 Reserved for inherently not codable concepts without codable children: Secondary | ICD-10-CM

## 2014-02-18 DIAGNOSIS — I4891 Unspecified atrial fibrillation: Secondary | ICD-10-CM

## 2014-02-18 DIAGNOSIS — E1165 Type 2 diabetes mellitus with hyperglycemia: Secondary | ICD-10-CM

## 2014-02-18 DIAGNOSIS — I10 Essential (primary) hypertension: Secondary | ICD-10-CM

## 2014-02-18 DIAGNOSIS — IMO0002 Reserved for concepts with insufficient information to code with codable children: Principal | ICD-10-CM

## 2014-02-18 DIAGNOSIS — G8220 Paraplegia, unspecified: Secondary | ICD-10-CM

## 2014-02-18 DIAGNOSIS — E119 Type 2 diabetes mellitus without complications: Secondary | ICD-10-CM

## 2014-02-18 DIAGNOSIS — W19XXXA Unspecified fall, initial encounter: Secondary | ICD-10-CM

## 2014-02-18 DIAGNOSIS — S22009A Unspecified fracture of unspecified thoracic vertebra, initial encounter for closed fracture: Secondary | ICD-10-CM | POA: Diagnosis present

## 2014-02-18 LAB — GLUCOSE, CAPILLARY
GLUCOSE-CAPILLARY: 290 mg/dL — AB (ref 70–99)
GLUCOSE-CAPILLARY: 250 mg/dL — AB (ref 70–99)
GLUCOSE-CAPILLARY: 165 mg/dL — AB (ref 70–99)
GLUCOSE-CAPILLARY: 142 mg/dL — AB (ref 70–99)
GLUCOSE-CAPILLARY: 121 mg/dL — AB (ref 70–99)
Glucose-Capillary: 263 mg/dL — ABNORMAL HIGH (ref 70–99)
Glucose-Capillary: 293 mg/dL — ABNORMAL HIGH (ref 70–99)
Glucose-Capillary: 300 mg/dL — ABNORMAL HIGH (ref 70–99)
Glucose-Capillary: 292 mg/dL — ABNORMAL HIGH (ref 70–99)
Glucose-Capillary: 286 mg/dL — ABNORMAL HIGH (ref 70–99)
Glucose-Capillary: 152 mg/dL — ABNORMAL HIGH (ref 70–99)
Glucose-Capillary: 154 mg/dL — ABNORMAL HIGH (ref 70–99)
Glucose-Capillary: 173 mg/dL — ABNORMAL HIGH (ref 70–99)
Glucose-Capillary: 147 mg/dL — ABNORMAL HIGH (ref 70–99)

## 2014-02-18 LAB — CBC
HCT: 35.8 % — ABNORMAL LOW (ref 36.0–46.0)
Hemoglobin: 11.5 g/dL — ABNORMAL LOW (ref 12.0–15.0)
MCH: 29.8 pg (ref 26.0–34.0)
MCHC: 32.1 g/dL (ref 30.0–36.0)
MCV: 92.7 fL (ref 78.0–100.0)
PLATELETS: 191 10*3/uL (ref 150–400)
RBC: 3.86 MIL/uL — AB (ref 3.87–5.11)
RDW: 13.5 % (ref 11.5–15.5)
WBC: 16.2 10*3/uL — ABNORMAL HIGH (ref 4.0–10.5)

## 2014-02-18 LAB — PROCALCITONIN: Procalcitonin: 0.51 ng/mL

## 2014-02-18 LAB — BASIC METABOLIC PANEL
ANION GAP: 15 (ref 5–15)
BUN: 21 mg/dL (ref 6–23)
CALCIUM: 8.2 mg/dL — AB (ref 8.4–10.5)
CO2: 23 mEq/L (ref 19–32)
CREATININE: 0.79 mg/dL (ref 0.50–1.10)
Chloride: 100 mEq/L (ref 96–112)
GFR calc Af Amer: >90 mL/min (ref >90)
GFR calc non Af Amer: 78 mL/min — ABNORMAL LOW (ref >90)
Glucose, Bld: 334 mg/dL — ABNORMAL HIGH (ref 70–99)
Potassium: 4.3 mEq/L (ref 3.7–5.3)
SODIUM: 138 meq/L (ref 137–147)

## 2014-02-18 LAB — MRSA PCR SCREENING: MRSA by PCR: NEGATIVE

## 2014-02-18 MED ORDER — LISINOPRIL 5 MG PO TABS
5.0000 mg | ORAL_TABLET | Freq: Every day | ORAL | Status: DC
  Administered 2014-02-18 – 2014-02-21 (×4): 5 mg via ORAL
  Filled 2014-02-18 (×4): qty 1

## 2014-02-18 MED ORDER — SUFENTANIL CITRATE 50 MCG/ML IV SOLN
INTRAVENOUS | Status: DC | PRN
  Administered 2014-02-18 (×2): 10 ug via INTRAVENOUS

## 2014-02-18 MED ORDER — SODIUM CHLORIDE 0.9 % IV SOLN
INTRAVENOUS | Status: DC | PRN
Start: 2014-02-18 — End: 2014-02-18
  Administered 2014-02-18: 02:00:00 via INTRAVENOUS

## 2014-02-18 MED ORDER — FLEET ENEMA 7-19 GM/118ML RE ENEM
1.0000 | ENEMA | Freq: Once | RECTAL | Status: AC | PRN
  Filled 2014-02-18: qty 1

## 2014-02-18 MED ORDER — ALUM & MAG HYDROXIDE-SIMETH 200-200-20 MG/5ML PO SUSP: 30.0000 mL | Freq: Four times a day (QID) | ORAL | Status: DC | PRN

## 2014-02-18 MED ORDER — KCL IN DEXTROSE-NACL 20-5-0.45 MEQ/L-%-% IV SOLN
INTRAVENOUS | Status: DC
  Administered 2014-02-18: 75 mL/h via INTRAVENOUS
  Filled 2014-02-18 (×2): qty 1000

## 2014-02-18 MED ORDER — BUPIVACAINE HCL (PF) 0.5 % IJ SOLN
INTRAMUSCULAR | Status: DC | PRN
  Administered 2014-02-18: 10 mL

## 2014-02-18 MED ORDER — PHENOL 1.4 % MT LIQD: 1.0000 | OROMUCOSAL | Status: DC | PRN

## 2014-02-18 MED ORDER — MORPHINE SULFATE 2 MG/ML IJ SOLN
1.0000 mg | INTRAMUSCULAR | Status: DC | PRN
  Administered 2014-02-18: 2 mg via INTRAVENOUS
  Filled 2014-02-18: qty 1

## 2014-02-18 MED ORDER — INSULIN ASPART 100 UNIT/ML ~~LOC~~ SOLN: 2.0000 [IU] | SUBCUTANEOUS | Status: DC

## 2014-02-18 MED ORDER — INSULIN ASPART 100 UNIT/ML ~~LOC~~ SOLN
0.0000 [IU] | SUBCUTANEOUS | Status: DC
  Administered 2014-02-18: 8 [IU] via SUBCUTANEOUS

## 2014-02-18 MED ORDER — DIAZEPAM 5 MG PO TABS: 2.5000 mg | ORAL_TABLET | Freq: Three times a day (TID) | ORAL | Status: DC | PRN

## 2014-02-18 MED ORDER — ACETAMINOPHEN 325 MG PO TABS: 650.0000 mg | ORAL_TABLET | ORAL | Status: DC | PRN

## 2014-02-18 MED ORDER — OXYCODONE-ACETAMINOPHEN 5-325 MG PO TABS
1.0000 | ORAL_TABLET | ORAL | Status: DC | PRN
  Administered 2014-02-21 – 2014-02-22 (×2): 2 via ORAL
  Administered 2014-02-22: 1 via ORAL
  Administered 2014-02-22: 2 via ORAL
  Administered 2014-02-22: 1 via ORAL
  Administered 2014-02-23 (×2): 2 via ORAL
  Filled 2014-02-18 (×4): qty 2

## 2014-02-18 MED ORDER — PROMETHAZINE HCL 25 MG/ML IJ SOLN: 6.2500 mg | INTRAMUSCULAR | Status: DC | PRN

## 2014-02-18 MED ORDER — BISACODYL 10 MG RE SUPP
10.0000 mg | Freq: Every day | RECTAL | Status: DC | PRN
  Administered 2014-02-23: 10 mg via RECTAL
  Filled 2014-02-18 (×2): qty 1

## 2014-02-18 MED ORDER — DEXAMETHASONE SODIUM PHOSPHATE 4 MG/ML IJ SOLN
INTRAMUSCULAR | Status: DC | PRN
  Administered 2014-02-18: 10 mg via INTRAVENOUS

## 2014-02-18 MED ORDER — METFORMIN HCL 500 MG PO TABS
500.0000 mg | ORAL_TABLET | Freq: Two times a day (BID) | ORAL | Status: DC
  Filled 2014-02-18 (×3): qty 1

## 2014-02-18 MED ORDER — PROPOFOL 10 MG/ML IV BOLUS
INTRAVENOUS | Status: DC | PRN
  Administered 2014-02-18: 200 mg via INTRAVENOUS

## 2014-02-18 MED ORDER — PANTOPRAZOLE SODIUM 40 MG IV SOLR
40.0000 mg | Freq: Every day | INTRAVENOUS | Status: DC
  Administered 2014-02-18: 40 mg via INTRAVENOUS
  Filled 2014-02-18 (×2): qty 40

## 2014-02-18 MED ORDER — GLYCOPYRROLATE 0.2 MG/ML IJ SOLN
INTRAMUSCULAR | Status: DC | PRN
  Administered 2014-02-18: 0.4 mg via INTRAVENOUS

## 2014-02-18 MED ORDER — MENTHOL 3 MG MT LOZG: 1.0000 | LOZENGE | OROMUCOSAL | Status: DC | PRN

## 2014-02-18 MED ORDER — THROMBIN 20000 UNITS EX SOLR
CUTANEOUS | Status: DC | PRN
  Administered 2014-02-18: 01:00:00 via TOPICAL

## 2014-02-18 MED ORDER — ALBUMIN HUMAN 5 % IV SOLN
INTRAVENOUS | Status: DC | PRN
  Administered 2014-02-18: 01:00:00 via INTRAVENOUS

## 2014-02-18 MED ORDER — SODIUM CHLORIDE 0.9 % IJ SOLN
3.0000 mL | Freq: Two times a day (BID) | INTRAMUSCULAR | Status: DC
  Administered 2014-02-18 – 2014-02-23 (×8): 3 mL via INTRAVENOUS

## 2014-02-18 MED ORDER — CEFAZOLIN SODIUM-DEXTROSE 2-3 GM-% IV SOLR
INTRAVENOUS | Status: DC | PRN
  Administered 2014-02-18: 2 g via INTRAVENOUS

## 2014-02-18 MED ORDER — PHENYLEPHRINE HCL 10 MG/ML IJ SOLN
INTRAMUSCULAR | Status: DC | PRN
  Administered 2014-02-18: 80 ug via INTRAVENOUS

## 2014-02-18 MED ORDER — DOCUSATE SODIUM 100 MG PO CAPS
100.0000 mg | ORAL_CAPSULE | Freq: Two times a day (BID) | ORAL | Status: DC
  Administered 2014-02-18 – 2014-02-23 (×11): 100 mg via ORAL
  Filled 2014-02-18 (×12): qty 1

## 2014-02-18 MED ORDER — SODIUM CHLORIDE 0.9 % IV SOLN
INTRAVENOUS | Status: DC
  Administered 2014-02-18: 2.3 [IU]/h via INTRAVENOUS
  Filled 2014-02-18: qty 2.5

## 2014-02-18 MED ORDER — DIAZEPAM 5 MG PO TABS: 5.0000 mg | ORAL_TABLET | Freq: Four times a day (QID) | ORAL | Status: DC | PRN

## 2014-02-18 MED ORDER — CEFAZOLIN SODIUM-DEXTROSE 2-3 GM-% IV SOLR
2.0000 g | Freq: Three times a day (TID) | INTRAVENOUS | Status: AC
  Administered 2014-02-18 (×2): 2 g via INTRAVENOUS
  Filled 2014-02-18 (×2): qty 50

## 2014-02-18 MED ORDER — ROCURONIUM BROMIDE 100 MG/10ML IV SOLN
INTRAVENOUS | Status: DC | PRN
  Administered 2014-02-18: 50 mg via INTRAVENOUS

## 2014-02-18 MED ORDER — ONDANSETRON HCL 4 MG/2ML IJ SOLN
INTRAMUSCULAR | Status: DC | PRN
  Administered 2014-02-18: 4 mg via INTRAVENOUS

## 2014-02-18 MED ORDER — INSULIN GLARGINE 100 UNIT/ML ~~LOC~~ SOLN
15.0000 [IU] | Freq: Every day | SUBCUTANEOUS | Status: AC
  Administered 2014-02-18: 15 [IU] via SUBCUTANEOUS
  Filled 2014-02-18: qty 0.15

## 2014-02-18 MED ORDER — DIAZEPAM 2 MG PO TABS: 1.0000 mg | ORAL_TABLET | Freq: Three times a day (TID) | ORAL | Status: DC | PRN

## 2014-02-18 MED ORDER — LACTATED RINGERS IV SOLN
INTRAVENOUS | Status: DC | PRN
  Administered 2014-02-18: via INTRAVENOUS

## 2014-02-18 MED ORDER — 0.9 % SODIUM CHLORIDE (POUR BTL) OPTIME
TOPICAL | Status: DC | PRN
  Administered 2014-02-18: 1000 mL

## 2014-02-18 MED ORDER — POLYETHYLENE GLYCOL 3350 17 G PO PACK
17.0000 g | PACK | Freq: Every day | ORAL | Status: DC | PRN
  Administered 2014-02-22 – 2014-02-23 (×3): 17 g via ORAL
  Filled 2014-02-18 (×4): qty 1

## 2014-02-18 MED ORDER — ACETAMINOPHEN 650 MG RE SUPP: 650.0000 mg | RECTAL | Status: DC | PRN

## 2014-02-18 MED ORDER — ONDANSETRON HCL 4 MG/2ML IJ SOLN: 4.0000 mg | INTRAMUSCULAR | Status: DC | PRN

## 2014-02-18 MED ORDER — LORATADINE 10 MG PO TABS
10.0000 mg | ORAL_TABLET | Freq: Every day | ORAL | Status: DC
  Administered 2014-02-18 – 2014-02-23 (×6): 10 mg via ORAL
  Filled 2014-02-18 (×6): qty 1

## 2014-02-18 MED ORDER — SODIUM CHLORIDE 0.9 % IV SOLN: 250.0000 mL | INTRAVENOUS | Status: DC

## 2014-02-18 MED ORDER — DILTIAZEM HCL 30 MG PO TABS
30.0000 mg | ORAL_TABLET | Freq: Four times a day (QID) | ORAL | Status: DC
  Administered 2014-02-18 – 2014-02-19 (×4): 30 mg via ORAL
  Filled 2014-02-18 (×8): qty 1

## 2014-02-18 MED ORDER — HYDROCODONE-ACETAMINOPHEN 5-325 MG PO TABS
1.0000 | ORAL_TABLET | ORAL | Status: DC | PRN
  Administered 2014-02-18 – 2014-02-19 (×3): 1 via ORAL
  Administered 2014-02-19 – 2014-02-20 (×3): 2 via ORAL
  Administered 2014-02-21: 1 via ORAL
  Administered 2014-02-21 (×3): 2 via ORAL
  Administered 2014-02-22: 1 via ORAL
  Filled 2014-02-18: qty 1
  Filled 2014-02-18: qty 2
  Filled 2014-02-18: qty 1
  Filled 2014-02-18 (×4): qty 2
  Filled 2014-02-18: qty 1
  Filled 2014-02-18: qty 2
  Filled 2014-02-18 (×2): qty 1

## 2014-02-18 MED ORDER — HYDROMORPHONE HCL PF 1 MG/ML IJ SOLN
0.2500 mg | INTRAMUSCULAR | Status: DC | PRN
  Administered 2014-02-20: 0.5 mg via INTRAVENOUS
  Filled 2014-02-18: qty 1

## 2014-02-18 MED ORDER — OXYCODONE-ACETAMINOPHEN 5-325 MG PO TABS
1.0000 | ORAL_TABLET | ORAL | Status: DC | PRN
  Filled 2014-02-18: qty 2
  Filled 2014-02-18 (×2): qty 1

## 2014-02-18 MED ORDER — SODIUM CHLORIDE 0.9 % IJ SOLN: 3.0000 mL | INTRAMUSCULAR | Status: DC | PRN

## 2014-02-18 MED ORDER — LIDOCAINE-EPINEPHRINE 1 %-1:100000 IJ SOLN
INTRAMUSCULAR | Status: DC | PRN
Start: 2014-02-18 — End: 2014-02-18
  Administered 2014-02-18: 10 mL

## 2014-02-18 MED ORDER — NEOSTIGMINE METHYLSULFATE 10 MG/10ML IV SOLN
INTRAVENOUS | Status: DC | PRN
  Administered 2014-02-18: 3 mg via INTRAVENOUS

## 2014-02-18 MED ORDER — INSULIN ASPART 100 UNIT/ML ~~LOC~~ SOLN
2.0000 [IU] | SUBCUTANEOUS | Status: DC
  Administered 2014-02-19 (×6): 4 [IU] via SUBCUTANEOUS
  Administered 2014-02-19: 6 [IU] via SUBCUTANEOUS
  Administered 2014-02-20: 2 [IU] via SUBCUTANEOUS
  Administered 2014-02-20 (×3): 4 [IU] via SUBCUTANEOUS
  Administered 2014-02-20: 2 [IU] via SUBCUTANEOUS
  Administered 2014-02-21: 4 [IU] via SUBCUTANEOUS
  Administered 2014-02-21: 2 [IU] via SUBCUTANEOUS
  Administered 2014-02-21: 6 [IU] via SUBCUTANEOUS
  Administered 2014-02-21: 4 [IU] via SUBCUTANEOUS
  Administered 2014-02-21: 6 [IU] via SUBCUTANEOUS
  Administered 2014-02-21 – 2014-02-22 (×2): 4 [IU] via SUBCUTANEOUS
  Administered 2014-02-22: 6 [IU] via SUBCUTANEOUS
  Administered 2014-02-22: 4 [IU] via SUBCUTANEOUS
  Administered 2014-02-22 – 2014-02-23 (×7): 6 [IU] via SUBCUTANEOUS
  Administered 2014-02-23: 4 [IU] via SUBCUTANEOUS

## 2014-02-18 MED ORDER — ZOLPIDEM TARTRATE 5 MG PO TABS: 5.0000 mg | ORAL_TABLET | Freq: Every evening | ORAL | Status: DC | PRN

## 2014-02-18 MED ORDER — DOCUSATE SODIUM 100 MG PO CAPS
100.0000 mg | ORAL_CAPSULE | Freq: Two times a day (BID) | ORAL | Status: DC
  Filled 2014-02-18 (×2): qty 1

## 2014-02-18 MED ORDER — SENNA 8.6 MG PO TABS
1.0000 | ORAL_TABLET | Freq: Two times a day (BID) | ORAL | Status: DC
  Administered 2014-02-18 – 2014-02-23 (×11): 8.6 mg via ORAL
  Filled 2014-02-18 (×12): qty 1

## 2014-02-18 NOTE — Progress Notes (Signed)
I await therapy evaluations to begin discussions with pt and family concerning rehab venue options and insurance authorizations. [phone]

## 2014-02-18 NOTE — Transfer of Care (Signed)
Immediate Anesthesia Transfer of Care Note  Patient: Yolanda Solis  Procedure(s) Performed: Procedure(s) with comments: Thoracolumbar Repair (N/A) - Thoracolumbar Repair T8 - T12  Patient Location: NICU  Anesthesia Type:General  Level of Consciousness: sedated, patient cooperative and responds to stimulation  Airway & Oxygen Therapy: Patient Spontanous Breathing and Patient connected to nasal cannula oxygen  Post-op Assessment: Post -op Vital signs reviewed and stable and Report given to ICU RN  Post vital signs: Reviewed and stable  Complications: No apparent anesthesia complications

## 2014-02-18 NOTE — Progress Notes (Addendum)
Inpatient Diabetes Program Recommendations  AACE/ADA: New Consensus Statement on Inpatient Glycemic Control (2013)  Target Ranges:  Prepandial:   less than 140 mg/dL      Peak postprandial:   less than 180 mg/dL (1-2 hours)      Critically ill patients:  140 - 180 mg/dL   Reason for Assessment:  Referral received.  Note that patient received Decadron 10 mg at 0105 this morning which has likely increased CBG's.  Diabetes history: Type 2 Diabetes Outpatient Diabetes medications: Metformin 500 mg bid Current orders for Inpatient glycemic control:  Patient is currently on insulin drip.  Please check A1C to determine prehospitalization glycemic control.  Will need to have CHO coverage while on the insulin drip to cover CHO intake. Once CBG's are within range for 6 hours on  insulin drip, may consider Lantus 15 units 2 hours prior to D/C of insulin drip.  Unclear whether patient will need insulin long term however A1C would be helpful to determine.    Will follow.  Thanks,  Adah Perl, RN, BC-ADM Inpatient Diabetes Coordinator Pager [phone]

## 2014-02-18 NOTE — Consult Note (Signed)
PULMONARY / CRITICAL CARE MEDICINE   Name: Yolanda Solis MRN: [id number] DOB: [birth year]    ADMISSION DATE:  02/17/2014 CONSULTATION DATE:  02/18/2014  REFERRING MD :  Dr. Vertell Limber  CHIEF COMPLAINT:  BLE weakness  INITIAL PRESENTATION: 80 year old female presented to [facility] 8/27 s/p fall on 8/26. She was complaining of weakness to bilateral lower extremities. In ED found to have unstable T10 fracture. She was transferred to [facility] for emergent stabilization of fracture and decompression of spinal cord. To ICU post-operatively.   STUDIES:  8/27 MRI T-spine > Displaced chance fracture through the T10 vertebral body. There is spinal stenosis and mild cord compression and mild cord edema. This fracture is unstable.  SIGNIFICANT EVENTS: 8/26 fall, to [facility]. Discharged after Xrays negative 8/27 Returned to ED, Fx found. Transfer to [facility] for emergent surgery.   HISTORY OF PRESENT ILLNESS:  80 year old female with PMH as below, which includes DM and HTN. She fell at home 8/26 with resultant back pain. Presented to ED same day. Lumbar spine CXR was negative, she was sent home. 8/27 she developed progressive weakness to BLE and returned to ED. Evaluation included an MRI of t-spine, on which an unstable chance fracture to T10 was found with resultant spinal cord compression. She was transferred to [facility] for neurosurgical evaluation and was taken emergently to OR for repair. She is in the ICU post operatively. PCCM asked to see for medical management.   PAST MEDICAL HISTORY :  Past Medical History  Diagnosis Date  . Diabetes mellitus   . Hypertension   . Arthritis    Past Surgical History  Procedure Laterality Date  . Abdominal hysterectomy     Prior to Admission medications   Medication Sig Start Date End Date Taking? Authorizing Provider  diazepam (VALIUM) 5 MG tablet Take 0.5 tablets (2.5 mg total) by mouth every 8 (eight) hours as needed for anxiety. 02/16/14  Yes Virgel Manifold, MD  lisinopril  (PRINIVIL,ZESTRIL) 5 MG tablet Take 5 mg by mouth daily.   Yes Historical Provider, MD  loratadine (ALLERGY RELIEF) 10 MG tablet Take 10 mg by mouth daily.   Yes Historical Provider, MD  metFORMIN (GLUCOPHAGE) 500 MG tablet Take 500 mg by mouth 2 (two) times daily with a meal.    Yes Historical Provider, MD  oxyCODONE-acetaminophen (PERCOCET/ROXICET) 5-325 MG per tablet Take 1-2 tablets by mouth every 4 (four) hours as needed for severe pain. 02/16/14  Yes Virgel Manifold, MD  docusate sodium (COLACE) 100 MG capsule Take 1 capsule (100 mg total) by mouth every 12 (twelve) hours. 02/16/14   Virgel Manifold, MD  ibuprofen (ADVIL,MOTRIN) 200 MG tablet Take 400 mg by mouth at bedtime.     Historical Provider, MD   No Known Allergies  FAMILY HISTORY:  No family history on file. SOCIAL HISTORY:  reports that she has never smoked. She does not have any smokeless tobacco history on file. She reports that she does not drink alcohol or use illicit drugs.  REVIEW OF SYSTEMS:   Bolds are positive  Constitutional: weight loss, gain, night sweats, Fevers, chills, fatigue .  HEENT: headaches, Sore throat, sneezing, nasal congestion, post nasal drip, Difficulty swallowing, Tooth/dental problems, visual complaints visual changes, ear ache CV:  chest pain, radiates: ,Orthopnea, PND, swelling in lower extremities, dizziness, palpitations, syncope.  GI  heartburn, indigestion, abdominal pain, nausea, vomiting, diarrhea, change in bowel habits, loss of appetite, bloody stools.  Resp: cough, non productive: , hemoptysis, dyspnea, chest pain, pleuritic.  Skin: rash or itching or icterus GU: dysuria, change in color of urine, urgency or frequency. flank pain, hematuria  MS: joint pain or swelling. decreased range of motion  Psych: change in mood or affect. depression or anxiety.  Neuro: difficulty with speech, weakness, numbness, ataxia   SUBJECTIVE: Remains paralyzed post op, no other events overnight.  VITAL  SIGNS: Temp:  [97.3 F (36.3 C)-98.2 F (36.8 C)] 97.7 F (36.5 C) (08/28 0800) Pulse Rate:  [71-125] 98 (08/28 0900) Resp:  [11-22] 22 (08/28 0900) BP: (105-174)/(26-115) 143/67 mmHg (08/28 0900) SpO2:  [88 %-100 %] 99 % (08/28 0900) Arterial Line BP: (145-162)/(63-71) 156/68 mmHg (08/28 0900) Weight:  [200 lb (90.719 kg)-208 lb 12.4 oz (94.7 kg)] 208 lb 12.4 oz (94.7 kg) (08/27 2320) HEMODYNAMICS:   VENTILATOR SETTINGS:   INTAKE / OUTPUT:  Intake/Output Summary (Last 24 hours) at 02/18/14 1024 Last data filed at 02/18/14 7062  Gross per 24 hour  Intake 2003.42 ml  Output    590 ml  Net 1413.42 ml    PHYSICAL EXAMINATION: General:  Elderly female in no acute distress Neuro:  Spontaneously awake, alert, oriented, mild confusion HEENT:  [place]/AT, PERRL, no JVD noted Cardiovascular:  Irreg Irreg, rate controlled.  Lungs:  Clear anteriorly  Abdomen:  Soft, non-tender, non-distended Musculoskeletal:  No acute deformity. No movement of BLE Skin:  Intact, no sensation to BLE  LABS:  CBC  Recent Labs Lab 02/17/14 2008 02/18/14 0620  WBC 20.2* 16.2*  HGB 13.4 11.5*  HCT 40.1 35.8*  PLT 223 191   Coag's No results found for this basename: APTT, INR,  in the last 168 hours  BMET  Recent Labs Lab 02/17/14 2008 02/18/14 0620  NA 137 138  K 3.9 4.3  CL 95* 100  CO2 24 23  BUN 19 21  CREATININE 0.82 0.79  GLUCOSE 275* 334*   Electrolytes  Recent Labs Lab 02/17/14 2008 02/18/14 0620  CALCIUM 9.3 8.2*   Sepsis Markers  Recent Labs Lab 02/18/14 0620  PROCALCITON 0.51   ABG No results found for this basename: PHART, PCO2ART, PO2ART,  in the last 168 hours Liver Enzymes No results found for this basename: AST, ALT, ALKPHOS, BILITOT, ALBUMIN,  in the last 168 hours Cardiac Enzymes No results found for this basename: TROPONINI, PROBNP,  in the last 168 hours Glucose  Recent Labs Lab 02/18/14 0320 02/18/14 0536 02/18/14 0800 02/18/14 0920  GLUCAP  263* 293* 300* 292*    Imaging Mr Thoracic Spine Wo Contrast  02/17/2014   ADDENDUM REPORT: 02/17/2014 21:41  ADDENDUM: Critical Value/emergent results were called by telephone at the time of interpretation on 02/17/2014 at 9:40 pm to Dr. Ripley Fraise , who verbally acknowledged these results.   Electronically Signed   By: Franchot Gallo M.D.   On: 02/17/2014 21:41   02/17/2014   CLINICAL DATA:  [place] yesterday.  Back pain.  Bilateral leg weakness  EXAM: MRI THORACIC SPINE WITHOUT CONTRAST  TECHNIQUE: Multiplanar, multisequence MR imaging of the thoracic spine was performed. No intravenous contrast was administered.  COMPARISON:  None.  FINDINGS: Displaced chance fracture of T10. Horizontal fracture through the T10 vertebral body with hematoma. There is anterior displacement of the upper half of T10 on the lower half by approximately 6 mm. Fracture extends into the pedicles bilaterally. There is probable epidural hematoma. There is cord compression and mild cord signal abnormality. This fracture is unstable.  No other fracture.  No disc protrusion identified.  IMPRESSION: Displaced chance  fracture through the T10 vertebral body. There is spinal stenosis and mild cord compression and mild cord edema. This fracture is unstable.  Electronically Signed: By: Franchot Gallo M.D. On: 02/17/2014 21:31   ASSESSMENT / PLAN:  PULMONARY A: No acute issues  P:   IS per RT protocol Attempt to get off O2 today for sat of 88-92%.  CARDIOVASCULAR A:  Atrial Fibrillation - appears to be new onset, had prior to surgery at [facility]. Currently rate controlled.  H/o HTN  P:  Continuous cardiac monitoring Would need anticoagulation if deemed candidate per NS. Continue preadmission antihypertensives BP goals per neurosurgery Start PO cardizem for HR control.  RENAL A:   No acute issues  P:   Follow Bmet Replace electrolytes as indicated. KVO IVF.  GASTROINTESTINAL A:   No acute issues  P:   SUP: IV  protonix Carb modified diet  HEMATOLOGIC A:   No acute issues  P:  Follow CBC Hold VTE chemoprophylaxis SCD's  INFECTIOUS A:   SIRS, no obvious source Leukocytosis  P:   Urine Cx 8/27 >>> PCT 0.51, hold off abx for now. Low threshold to initiate antibiotic therapy.   ENDOCRINE A:  DM  P:   CBG monitoring. SSI. Hold preadmission metformin. Insulin drip. Diabetic coordinator to assist with management.  NEUROLOGIC A:   S/p surgical repair of T10 fracture with spinal cord compression Paraplegia BLE Acute encephalopahty  P:   Per neurosurgery Pain management per neurosurgery Monitor clinically PT/OT and OOB to chair. Adjust valium to home dose.  Georgann Housekeeper, ACNP [facility] Pager [phone] or [phone]  Above note edited in full, above reflects my findings.  I have personally obtained a history, examined the patient, evaluated laboratory and imaging results, formulated the assessment and plan and placed orders.  Rush Farmer, M.D. [facility] Pulmonary/Critical Care Medicine. Pager: [phone]. After hours pager: [phone].

## 2014-02-18 NOTE — Anesthesia Procedure Notes (Signed)
Procedure Name: Intubation Date/Time: 02/18/2014 12:22 AM Performed by: Claris Che Pre-anesthesia Checklist: Patient identified, Emergency Drugs available, Suction available, Patient being monitored and Timeout performed Patient Re-evaluated:Patient Re-evaluated prior to inductionOxygen Delivery Method: Circle system utilized Preoxygenation: Pre-oxygenation with 100% oxygen Intubation Type: IV induction Ventilation: Mask ventilation without difficulty Laryngoscope Size: Mac and 3 Grade View: Grade I Tube type: Oral Tube size: 8.0 mm Number of attempts: 1 Airway Equipment and Method: Stylet Placement Confirmation: ETT inserted through vocal cords under direct vision,  positive ETCO2 and breath sounds checked- equal and bilateral Secured at: 23 cm Tube secured with: Tape Dental Injury: Teeth and Oropharynx as per pre-operative assessment

## 2014-02-18 NOTE — Brief Op Note (Signed)
02/17/2014 - 02/18/2014  2:55 AM  PATIENT:  Yolanda Solis  80 y.o. female  PRE-OPERATIVE DIAGNOSIS:  Thoracic Fracture T 10 fracture dislocation (Chance Fracture) with acute paraplegia  POST-OPERATIVE DIAGNOSIS:  Thoracic Fracture T 10 fracture dislocation (Chance Fracture) with acute paraplegia  PROCEDURE:  Procedure(s) with comments: Thoracolumbar Repair (N/A) - Thoracolumbar Repair T8 - T12 fusion with laminectomy and decompression of spinal cord with posterolateral arthrodesis  SURGEON:  Surgeon(s) and Role:    * Erline Levine, MD - Primary  PHYSICIAN ASSISTANT:   ASSISTANTS: none   ANESTHESIA:   general  EBL:  Total I/O In: 1259.1 [I.V.:1009.1; IV Piggyback:250] Out: 325 [Urine:75; Blood:250]  BLOOD ADMINISTERED:none  DRAINS: (Medium) Hemovact drain(s) in the epidural space with  Suction Open   LOCAL MEDICATIONS USED:  LIDOCAINE   SPECIMEN:  No Specimen  DISPOSITION OF SPECIMEN:  N/A  COUNTS:  YES  TOURNIQUET:  * No tourniquets in log *  DICTATION: Patient is 80 year old woman with T 10 Chance fracture and osteoporosis with acute onset paraplegia. It was elected to take her to surgery for decompression and fusion at this level on an emergent basis.   Procedure: Patient was placed in a prone position on the [place] table after smooth and uncomplicated induction of general endotracheal anesthesia. Her low back was prepped and draped in usual sterile fashion with betadine scrub and DuraPrep after AP localizing X ray was obtained with spinal needles taped to the skin. . Area of incision was infiltrated with local lidocaine. Incision was made to the lumbodorsal fascia was incised and exposure was performed of the T8-T12 spinous processes laminae facet joint and transverse processes. Intraoperative x-ray was obtained which confirmed correct orientation. A total laminectomy of T10 was performed decompression of the spinal cord at this level and thorough decompression was  performed of  nerve roots along with the common dural tube.  The posterolateral region was extensively decorticated and pedicle probes were placed at T8, T9, T11, T 12  bilaterally. Intraoperative fluoroscopy confirmed correct orientationin the AP and lateral plane. 40 x 5.5 mm pedicle screws were placed at T8, 45 x 5.5 at T9, 6.5 x 45 at T11 and  T12 bilaterally.  Intraoperative fluroscopy was used during this portion of the procedure. Final x-rays demonstrated well-positioned pedicle screw fixation.  A 140 mm rod was placed on the right and a 140 mm rod was placed on the left locked down in situ and the posterolateral region was packed with the medium BMP and autograft and bone graft extender bilaterally. A medium Hemovac drain was placed through a separate incision.  Fascia was closed with 1 Vicryl sutures skin edges were reapproximated 2 and 3-0 Vicryl sutures. The wound is dressed with Dermabond and an occlusive dressing.  the patient was extubated in the operating room and taken to recovery in stable satisfactory condition he tolerated traction well counts were correct at the end of the case.  PLAN OF CARE: Admit to inpatient   PATIENT DISPOSITION:  PACU - hemodynamically stable.   Delay start of Pharmacological VTE agent (>24hrs) due to surgical blood loss or risk of bleeding: yes

## 2014-02-18 NOTE — Anesthesia Postprocedure Evaluation (Signed)
Anesthesia Post Note  Patient: Yolanda Solis  Procedure(s) Performed: Procedure(s) (LRB): Thoracolumbar Repair (N/A)  Anesthesia type: General  Patient location: ICU  Post pain: Pain level controlled  Post assessment: Post-op Vital signs reviewed  Last Vitals:  Filed Vitals:   02/18/14 0315  BP: 133/109  Pulse:   Temp: 36.3 C  Resp:     Post vital signs: stable  Level of consciousness: sedated  Complications: No apparent anesthesia complications

## 2014-02-18 NOTE — Progress Notes (Signed)
Subjective: Patient reports OK.  No improvement in legs.  Objective: Vital signs in last 24 hours: Temp:  [97.3 F (36.3 C)-98.9 F (37.2 C)] 98.9 F (37.2 C) (08/28 1600) Pulse Rate:  [59-125] 89 (08/28 1700) Resp:  [11-22] 18 (08/28 1700) BP: (105-174)/(26-115) 122/58 mmHg (08/28 1400) SpO2:  [88 %-100 %] 90 % (08/28 1700) Arterial Line BP: (116-162)/(54-74) 134/54 mmHg (08/28 1700) Weight:  [94.7 kg (208 lb 12.4 oz)] 94.7 kg (208 lb 12.4 oz) (08/27 2320)  Intake/Output from previous day: 08/27 0701 - 08/28 0700 In: 1841.3 [I.V.:1541.3; IV Piggyback:300] Out: 7 [Urine:250; Drains:90; Blood:250] Intake/Output this shift: Total I/O In: 263.3 [I.V.:213.3; IV Piggyback:50] Out: 375 [Urine:375]  Physical Exam: Paraplegia unchanged along with sensory level.  Dressing CDI.  Lab Results:  Recent Labs  02/17/14 2008 02/18/14 0620  WBC 20.2* 16.2*  HGB 13.4 11.5*  HCT 40.1 35.8*  PLT 223 191   BMET  Recent Labs  02/17/14 2008 02/18/14 0620  NA 137 138  K 3.9 4.3  CL 95* 100  CO2 24 23  GLUCOSE 275* 334*  BUN 19 21  CREATININE 0.82 0.79  CALCIUM 9.3 8.2*    Studies/Results: Dg Thoracic Spine 4v  02/18/2014   CLINICAL DATA:  T8-T12 fusion.  EXAM: THORACIC SPINE - 4+ VIEW; DG C-ARM 61-120 MIN  COMPARISON:  MRI thoracic spine 02/17/2014  FINDINGS: Intraoperative fluoroscopy is obtained for surgical control purposes. Fluoroscopy time is not recorded. AP and lateral spot compression views over the lower thoracic spine obtained. Specific localization of vertebral levels is limited due to the field of view. The previous MR examination demonstrated a compression fracture at T10 and presumably the examination is centered on the T10 vertebra. Given this assumption, posterior pedicular screws are demonstrated at T8, T9, T11, and T12 levels.  IMPRESSION: Intraoperative fluoroscopy obtained for surgical localization purposes.   Electronically Signed   By: Lucienne Capers M.D.    On: 02/18/2014 02:32   Dg Thoracolumabar Spine  02/18/2014   CLINICAL DATA:  TA to T12 fusion.  EXAM: THORACOLUMBAR SPINE - 2 VIEW  COMPARISON:  MRI thoracic spine 02/17/2014.  Chest 08/15/2011  FINDINGS: Single AP portable view of the mid thoracic and upper lumbar region is obtained for surgical control purposes. Examination is technically limited due to underpenetration in the lumbar region. Determination of specific vertebral levels is not possible with a degree of certainty. Multiple metallic localization markers are projected over the spine at multiple levels.  IMPRESSION: Portable view of the thoracolumbar spine is obtained for surgical localization purposes. Technically limited study.   Electronically Signed   By: Lucienne Capers M.D.   On: 02/18/2014 02:29   Mr Thoracic Spine Wo Contrast  02/17/2014   ADDENDUM REPORT: 02/17/2014 21:41  ADDENDUM: Critical Value/emergent results were called by telephone at the time of interpretation on 02/17/2014 at 9:40 pm to Dr. Ripley Fraise , who verbally acknowledged these results.   Electronically Signed   By: Franchot Gallo M.D.   On: 02/17/2014 21:41   02/17/2014   CLINICAL DATA:  [place] yesterday.  Back pain.  Bilateral leg weakness  EXAM: MRI THORACIC SPINE WITHOUT CONTRAST  TECHNIQUE: Multiplanar, multisequence MR imaging of the thoracic spine was performed. No intravenous contrast was administered.  COMPARISON:  None.  FINDINGS: Displaced chance fracture of T10. Horizontal fracture through the T10 vertebral body with hematoma. There is anterior displacement of the upper half of T10 on the lower half by approximately 6 mm. Fracture extends into the  pedicles bilaterally. There is probable epidural hematoma. There is cord compression and mild cord signal abnormality. This fracture is unstable.  No other fracture.  No disc protrusion identified.  IMPRESSION: Displaced chance fracture through the T10 vertebral body. There is spinal stenosis and mild cord  compression and mild cord edema. This fracture is unstable.  Electronically Signed: By: Franchot Gallo M.D. On: 02/17/2014 21:31   Dg C-arm 61-120 Min  02/18/2014   CLINICAL DATA:  T8-T12 fusion.  EXAM: THORACIC SPINE - 4+ VIEW; DG C-ARM 61-120 MIN  COMPARISON:  MRI thoracic spine 02/17/2014  FINDINGS: Intraoperative fluoroscopy is obtained for surgical control purposes. Fluoroscopy time is not recorded. AP and lateral spot compression views over the lower thoracic spine obtained. Specific localization of vertebral levels is limited due to the field of view. The previous MR examination demonstrated a compression fracture at T10 and presumably the examination is centered on the T10 vertebra. Given this assumption, posterior pedicular screws are demonstrated at T8, T9, T11, and T12 levels.  IMPRESSION: Intraoperative fluoroscopy obtained for surgical localization purposes.   Electronically Signed   By: Lucienne Capers M.D.   On: 02/18/2014 02:32    Assessment/Plan: PT/Rehab, mobilize in brace. Start lovenox in AM.    LOS: 1 day    Peggyann Shoals, MD 02/18/2014, 6:37 PM

## 2014-02-18 NOTE — Evaluation (Signed)
Physical Therapy Evaluation Patient Details Name: Yolanda Solis MRN: [id number] DOB: Nov 22, 1933 Today's Date: 02/18/2014   History of Present Illness  80 year old female presented to [facility] 8/27 s/p fall on 8/26. She was complaining of weakness to bilateral lower extremities. In ED found to have unstable T10 fracture. She was transferred to [facility] for emergent stabilization of fracture and decompression of spinal cord. Patient s/p Thoracolumbar Repair T8 - T12 fusion with laminectomy and decompression of spinal cord with posterolateral arthrodesis.  Clinical Impression  Patient demonstrated deficits in mobility as indicated below. Patient will need continued skilled PT to address deficits and decreased burden of care. Patient currently tolerating EOB activity well with assist. Bilateral LE compression wraps applied for BP control during mobility and spinal brace applied in supine. At this time recommend CIR for spinal cord rehabilitation. Will see as indicated to facilitate discharge plan.    Follow Up Recommendations CIR    Equipment Recommendations  Wheelchair (measurements PT);Wheelchair cushion (measurements PT);Hospital bed (TBD)    Recommendations for Other Services Rehab consult     Precautions / Restrictions Precautions Precautions: Back Required Braces or Orthoses: Spinal Brace Spinal Brace: Thoracolumbosacral orthotic;Applied in supine position      Mobility  Bed Mobility Overal bed mobility: Needs Assistance;+2 for physical assistance Bed Mobility: Rolling;Sidelying to Sit;Sit to Sidelying Rolling: Max assist;+2 for physical assistance Sidelying to sit: Total assist;+2 for physical assistance     Sit to sidelying: Total assist;+2 for physical assistance General bed mobility comments: patient attempting to assist with bilateral UEs  Transfers                    Ambulation/Gait                Stairs            Wheelchair Mobility    Modified  Rankin (Stroke Patients Only)       Balance                                             Pertinent Vitals/Pain Pain Assessment: Faces Faces Pain Scale: Hurts whole lot Pain Location: lower back, buttock region    Home Living Family/patient expects to be discharged to:: Inpatient rehab                      Prior Function Level of Independence: Independent               Hand Dominance   Dominant Hand: Right    Extremity/Trunk Assessment   Upper Extremity Assessment: Defer to OT evaluation           Lower Extremity Assessment: RLE deficits/detail;LLE deficits/detail RLE Deficits / Details: no active movement noted paraplegia LLE Deficits / Details: no active movement noted paraplegia     Communication   Communication: No difficulties  Cognition Arousal/Alertness: Awake/alert Behavior During Therapy: WFL for tasks assessed/performed Overall Cognitive Status: Within Functional Limits for tasks assessed                      General Comments General comments (skin integrity, edema, etc.): Compression wraps and spinal brace applied in supine, Sat EOB for 10 minutes, tolerated well, VS monitored, A line in place nsg in room during activity    Exercises        Assessment/Plan  PT Assessment Patient needs continued PT services  PT Diagnosis Other (comment) (SCI paraplegia)   PT Problem List Pain;Obesity;Impaired sensation;Impaired tone;Decreased strength;Decreased range of motion;Decreased activity tolerance;Decreased balance;Decreased mobility  PT Treatment Interventions DME instruction;Functional mobility training;Therapeutic activities;Therapeutic exercise;Balance training;Neuromuscular re-education;Patient/family education;Wheelchair mobility training;Manual techniques;Modalities   PT Goals (Current goals can be found in the Care Plan section) Acute Rehab PT Goals Patient Stated Goal: none stated PT Goal Formulation:  With patient Time For Goal Achievement: 03/04/14 Potential to Achieve Goals: Fair    Frequency Min 3X/week   Barriers to discharge        Co-evaluation               End of Session Equipment Utilized During Treatment: Back brace Activity Tolerance: Patient limited by fatigue;Patient limited by pain Patient left: in bed;with call bell/phone within reach;with family/visitor present Nurse Communication: Mobility status;Precautions         Time: 4259-5638 PT Time Calculation (min): 31 min   Charges:   PT Evaluation $Initial PT Evaluation Tier I: 1 Procedure PT Treatments $Therapeutic Activity: 8-22 mins $Self Care/Home Management: 8-22   PT G CodesDuncan Dull 02/18/2014, 2:56 PM Alben Deeds, [place] DPT  [phone]

## 2014-02-18 NOTE — H&P (Signed)
Reason for Consult:T 10 Chance fracture with paraplegia Referring Physician: [name] is an 80 y.o. female.  HPI: Patient fell yesterday and had back pain.  Initial evaluation negative.  Patient was unable to move legs today and was taken by EMS to [facility].  MRI T spine reveals displaced T 1o Chance fracture.  Patient with complete paraplegia.  Brought to [facility] for treatment.  H/o DM and HTN.  Past Medical History  Diagnosis Date  . Diabetes mellitus   . Hypertension   . Arthritis     Past Surgical History  Procedure Laterality Date  . Abdominal hysterectomy      No family history on file.  Social History:  reports that she has never smoked. She does not have any smokeless tobacco history on file. She reports that she does not drink alcohol or use illicit drugs.  Allergies: No Known Allergies  Medications: I have reviewed the patient's current medications.  Results for orders placed during the hospital encounter of 02/17/14 (from the past 48 hour(s))  BASIC METABOLIC PANEL     Status: Abnormal   Collection Time    02/17/14  8:08 PM      Result Value Ref Range   Sodium 137  137 - 147 mEq/L   Potassium 3.9  3.7 - 5.3 mEq/L   Chloride 95 (*) 96 - 112 mEq/L   CO2 24  19 - 32 mEq/L   Glucose, Bld 275 (*) 70 - 99 mg/dL   BUN 19  6 - 23 mg/dL   Creatinine, Ser 0.82  0.50 - 1.10 mg/dL   Calcium 9.3  8.4 - 10.5 mg/dL   GFR calc non Af Amer 67 (*) >90 mL/min   GFR calc Af Amer 78 (*) >90 mL/min   Comment: (NOTE)     The eGFR has been calculated using the CKD EPI equation.     This calculation has not been validated in all clinical situations.     eGFR's persistently <90 mL/min signify possible Chronic Kidney     Disease.   Anion gap 18 (*) 5 - 15  CBC WITH DIFFERENTIAL     Status: Abnormal   Collection Time    02/17/14  8:08 PM      Result Value Ref Range   WBC 20.2 (*) 4.0 - 10.5 K/uL   RBC 4.38  3.87 - 5.11 MIL/uL   Hemoglobin 13.4  12.0 - 15.0  g/dL   HCT 40.1  36.0 - 46.0 %   MCV 91.6  78.0 - 100.0 fL   MCH 30.6  26.0 - 34.0 pg   MCHC 33.4  30.0 - 36.0 g/dL   RDW 13.4  11.5 - 15.5 %   Platelets 223  150 - 400 K/uL   Neutrophils Relative % 89 (*) 43 - 77 %   Lymphocytes Relative 7 (*) 12 - 46 %   Monocytes Relative 4  3 - 12 %   Eosinophils Relative 0  0 - 5 %   Basophils Relative 0  0 - 1 %   Neutro Abs 18.0 (*) 1.7 - 7.7 K/uL   Lymphs Abs 1.4  0.7 - 4.0 K/uL   Monocytes Absolute 0.8  0.1 - 1.0 K/uL   Eosinophils Absolute 0.0  0.0 - 0.7 K/uL   Basophils Absolute 0.0  0.0 - 0.1 K/uL   RBC Morphology STOMATOCYTES     WBC Morphology ATYPICAL LYMPHOCYTES     Comment: WHITE COUNT CONFIRMED  ON SMEAR  URINALYSIS, ROUTINE W REFLEX MICROSCOPIC     Status: Abnormal   Collection Time    02/17/14  9:55 PM      Result Value Ref Range   Color, Urine YELLOW  YELLOW   APPearance CLEAR  CLEAR   Specific Gravity, Urine >1.030 (*) 1.005 - 1.030   pH 5.0  5.0 - 8.0   Glucose, UA 500 (*) NEGATIVE mg/dL   Hgb urine dipstick SMALL (*) NEGATIVE   Bilirubin Urine NEGATIVE  NEGATIVE   Ketones, ur 15 (*) NEGATIVE mg/dL   Protein, ur 100 (*) NEGATIVE mg/dL   Urobilinogen, UA 0.2  0.0 - 1.0 mg/dL   Nitrite NEGATIVE  NEGATIVE   Leukocytes, UA NEGATIVE  NEGATIVE  URINE MICROSCOPIC-ADD ON     Status: None   Collection Time    02/17/14  9:55 PM      Result Value Ref Range   Squamous Epithelial / LPF RARE  RARE   WBC, UA 0-2  <3 WBC/hpf   RBC / HPF 0-2  <3 RBC/hpf   Bacteria, UA RARE  RARE    Dg Lumbar Spine Complete  02/16/2014   CLINICAL DATA:  80 year old female with low back pain after a fall. Initial encounter.  EXAM: LUMBAR SPINE - COMPLETE 4+ VIEW  COMPARISON:  CT Abdomen and Pelvis 12/25/2006.  FINDINGS: Progressed and bulky diffuse endplate spurring throughout the lumbar spine. Normal lumbar segmentation. Chronic vacuum disc phenomena at L4-L5. No lumbar compression fracture identified. Grossly stable and intact visualized lower  thoracic levels. Moderate lower lumbar facet hypertrophy. No pars fracture identified. Extensive Aortoiliac calcified atherosclerosis noted. Grossly intact sacral ala and SI joints.  IMPRESSION: 1.  No acute fracture or listhesis identified in the lumbar spine. 2. Diffuse idiopathic skeletal hyperostosis 3. Chronically advanced L4-L5 disc degeneration.   Electronically Signed   By: Lars Pinks M.D.   On: 02/16/2014 14:46   Mr Thoracic Spine Wo Contrast  02/17/2014   ADDENDUM REPORT: 02/17/2014 21:41  ADDENDUM: Critical Value/emergent results were called by telephone at the time of interpretation on 02/17/2014 at 9:40 pm to Dr. Ripley Fraise , who verbally acknowledged these results.   Electronically Signed   By: Franchot Gallo M.D.   On: 02/17/2014 21:41   02/17/2014   CLINICAL DATA:  [place] yesterday.  Back pain.  Bilateral leg weakness  EXAM: MRI THORACIC SPINE WITHOUT CONTRAST  TECHNIQUE: Multiplanar, multisequence MR imaging of the thoracic spine was performed. No intravenous contrast was administered.  COMPARISON:  None.  FINDINGS: Displaced chance fracture of T10. Horizontal fracture through the T10 vertebral body with hematoma. There is anterior displacement of the upper half of T10 on the lower half by approximately 6 mm. Fracture extends into the pedicles bilaterally. There is probable epidural hematoma. There is cord compression and mild cord signal abnormality. This fracture is unstable.  No other fracture.  No disc protrusion identified.  IMPRESSION: Displaced chance fracture through the T10 vertebral body. There is spinal stenosis and mild cord compression and mild cord edema. This fracture is unstable.  Electronically Signed: By: Franchot Gallo M.D. On: 02/17/2014 21:31    Review of Systems - Negative except As above    Blood pressure 149/89, pulse 110, temperature 98 F (36.7 C), temperature source Oral, resp. rate 15, height _0  (1.626 m), weight 94.7 kg (208 lb 12.4 oz), SpO2  95.00%. Physical Exam Awake, alert, conversant.  Full strength both upper extremities.  Unable to move or feel legs  at all.  T 11 sensory level.  Assessment/Plan: Acute paraplegia with T 10 Chance fracture.  To OR on emergent basis for decompression of spinal cord and stabilization of this unstable spinal fracture.  Prognosis guarded to poor for recovery of neurologic function.  Peggyann Shoals, MD 02/18/2014, 12:05 AM

## 2014-02-18 NOTE — Clinical Social Work Note (Signed)
Clinical Social Worker received referral for possible ST-SNF placement.  Chart reviewed.  PT/OT recommending inpatient rehab at this time.  CSW to follow along closely with inpatient rehab admissions coordinator to determine patient plans at discharge.     Barbette Or, [place]

## 2014-02-18 NOTE — Op Note (Signed)
02/17/2014 - 02/18/2014  2:55 AM  PATIENT:  Yolanda Solis  80 y.o. female  PRE-OPERATIVE DIAGNOSIS:  Thoracic Fracture T 10 fracture dislocation (Chance Fracture) with acute paraplegia  POST-OPERATIVE DIAGNOSIS:  Thoracic Fracture T 10 fracture dislocation (Chance Fracture) with acute paraplegia  PROCEDURE:  Procedure(s) with comments: Thoracolumbar Repair (N/A) - Thoracolumbar Repair T8 - T12 fusion with laminectomy and decompression of spinal cord with posterolateral arthrodesis  SURGEON:  Surgeon(s) and Role:    * Erline Levine, MD - Primary  PHYSICIAN ASSISTANT:   ASSISTANTS: none   ANESTHESIA:   general  EBL:  Total I/O In: 1259.1 [I.V.:1009.1; IV Piggyback:250] Out: 325 [Urine:75; Blood:250]  BLOOD ADMINISTERED:none  DRAINS: (Medium) Hemovact drain(s) in the epidural space with  Suction Open   LOCAL MEDICATIONS USED:  LIDOCAINE   SPECIMEN:  No Specimen  DISPOSITION OF SPECIMEN:  N/A  COUNTS:  YES  TOURNIQUET:  * No tourniquets in log *  DICTATION: Patient is 80 year old woman with T 10 Chance fracture and osteoporosis with acute onset paraplegia. It was elected to take her to surgery for decompression and fusion at this level on an emergent basis.   Procedure: Patient was placed in a prone position on the [place] table after smooth and uncomplicated induction of general endotracheal anesthesia. Her low back was prepped and draped in usual sterile fashion with betadine scrub and DuraPrep after AP localizing X ray was obtained with spinal needles taped to the skin. . Area of incision was infiltrated with local lidocaine. Incision was made to the lumbodorsal fascia was incised and exposure was performed of the T8-T12 spinous processes laminae facet joint and transverse processes. Intraoperative x-ray was obtained which confirmed correct orientation. A total laminectomy of T10 was performed decompression of the spinal cord at this level and thorough decompression was  performed of  nerve roots along with the common dural tube.  The posterolateral region was extensively decorticated and pedicle probes were placed at T8, T9, T11, T 12  bilaterally. Intraoperative fluoroscopy confirmed correct orientationin the AP and lateral plane. 40 x 5.5 mm pedicle screws were placed at T8, 45 x 5.5 at T9, 6.5 x 45 at T11 and  T12 bilaterally.  Intraoperative fluroscopy was used during this portion of the procedure. Final x-rays demonstrated well-positioned pedicle screw fixation.  A 140 mm rod was placed on the right and a 140 mm rod was placed on the left locked down in situ and the posterolateral region was packed with the medium BMP and autograft and bone graft extender bilaterally. A medium Hemovac drain was placed through a separate incision.  Fascia was closed with 1 Vicryl sutures skin edges were reapproximated 2 and 3-0 Vicryl sutures. The wound is dressed with Dermabond and an occlusive dressing.  the patient was extubated in the operating room and taken to recovery in stable satisfactory condition he tolerated traction well counts were correct at the end of the case.  PLAN OF CARE: Admit to inpatient   PATIENT DISPOSITION:  PACU - hemodynamically stable.   Delay start of Pharmacological VTE agent (>24hrs) due to surgical blood loss or risk of bleeding: yes

## 2014-02-18 NOTE — Consult Note (Signed)
Physical Medicine and Rehabilitation Consult Reason for Consult: Thoracic fracture T10 fracture dislocation(Chance fracture) with acute paraplegia Referring Physician: Dr. Vertell Limber   HPI: Yolanda Solis is a 80 y.o. right-handed female with history of hypertension as well as diabetes mellitus. Patient independent prior to admission living alone. She does not drive. Admitted 02/18/2014 after unwitnessed fall at home complaining of weakness to bilateral lower extremities. She denied loss of consciousness. X-rays and imaging revealed thoracic fracture T10 fracture dislocation Chance fracture with mild cord compression as well as cord edema. Underwent thoracolumbar repair T8-T12 fusion with laminectomy and decompression of spinal cord with posterior lateral arthrodesis 02/18/2014 per Dr. Vertell Limber. Hospital course EKG new onset atrial fibrillation maintained on Cardizem. Aspirin lumbar fusion brace in place. Physical and occupational therapy evaluations pending. M.D. has requested physical medicine rehabilitation consult.   Review of Systems  Gastrointestinal: Positive for constipation.  Musculoskeletal: Positive for falls and myalgias.  All other systems reviewed and are negative.  Past Medical History  Diagnosis Date  . Diabetes mellitus   . Hypertension   . Arthritis    Past Surgical History  Procedure Laterality Date  . Abdominal hysterectomy     No family history on file. Social History:  reports that she has never smoked. She does not have any smokeless tobacco history on file. She reports that she does not drink alcohol or use illicit drugs. Allergies: No Known Allergies Medications Prior to Admission  Medication Sig Dispense Refill  . diazepam (VALIUM) 5 MG tablet Take 0.5 tablets (2.5 mg total) by mouth every 8 (eight) hours as needed for anxiety.  10 tablet  0  . lisinopril (PRINIVIL,ZESTRIL) 5 MG tablet Take 5 mg by mouth daily.      Marland Kitchen loratadine (ALLERGY RELIEF) 10 MG  tablet Take 10 mg by mouth daily.      . metFORMIN (GLUCOPHAGE) 500 MG tablet Take 500 mg by mouth 2 (two) times daily with a meal.       . oxyCODONE-acetaminophen (PERCOCET/ROXICET) 5-325 MG per tablet Take 1-2 tablets by mouth every 4 (four) hours as needed for severe pain.  20 tablet  0  . docusate sodium (COLACE) 100 MG capsule Take 1 capsule (100 mg total) by mouth every 12 (twelve) hours.  30 capsule  0  . ibuprofen (ADVIL,MOTRIN) 200 MG tablet Take 400 mg by mouth at bedtime.         Home:    Functional History:   Functional Status:  Mobility:          ADL:    Cognition: Cognition Orientation Level: Oriented to person;Oriented to situation    Blood pressure 133/109, pulse 119, temperature 97.3 F (36.3 C), temperature source Axillary, resp. rate 19, height 5\' 4"  (1.626 m), weight 94.7 kg (208 lb 12.4 oz), SpO2 93.00%. Physical Exam  Constitutional: She is oriented to person, place, and time. She appears well-developed.  HENT:  Head: Normocephalic.  Eyes: EOM are normal.  Neck: Normal range of motion. Neck supple. No thyromegaly present.  Cardiovascular: Normal rate and regular rhythm.   Respiratory: Effort normal and breath sounds normal. No respiratory distress.  GI: Soft. Bowel sounds are normal. She exhibits no distension.  Neurological: She is alert and oriented to person, place, and time.  Skin:  Back incision was not examined.    Results for orders placed during the hospital encounter of 02/17/14 (from the past 24 hour(s))  BASIC METABOLIC PANEL     Status: Abnormal  Collection Time    02/17/14  8:08 PM      Result Value Ref Range   Sodium 137  137 - 147 mEq/L   Potassium 3.9  3.7 - 5.3 mEq/L   Chloride 95 (*) 96 - 112 mEq/L   CO2 24  19 - 32 mEq/L   Glucose, Bld 275 (*) 70 - 99 mg/dL   BUN 19  6 - 23 mg/dL   Creatinine, Ser 0.82  0.50 - 1.10 mg/dL   Calcium 9.3  8.4 - 10.5 mg/dL   GFR calc non Af Amer 67 (*) >90 mL/min   GFR calc Af Amer 78 (*)  >90 mL/min   Anion gap 18 (*) 5 - 15  CBC WITH DIFFERENTIAL     Status: Abnormal   Collection Time    02/17/14  8:08 PM      Result Value Ref Range   WBC 20.2 (*) 4.0 - 10.5 K/uL   RBC 4.38  3.87 - 5.11 MIL/uL   Hemoglobin 13.4  12.0 - 15.0 g/dL   HCT 40.1  36.0 - 46.0 %   MCV 91.6  78.0 - 100.0 fL   MCH 30.6  26.0 - 34.0 pg   MCHC 33.4  30.0 - 36.0 g/dL   RDW 13.4  11.5 - 15.5 %   Platelets 223  150 - 400 K/uL   Neutrophils Relative % 89 (*) 43 - 77 %   Lymphocytes Relative 7 (*) 12 - 46 %   Monocytes Relative 4  3 - 12 %   Eosinophils Relative 0  0 - 5 %   Basophils Relative 0  0 - 1 %   Neutro Abs 18.0 (*) 1.7 - 7.7 K/uL   Lymphs Abs 1.4  0.7 - 4.0 K/uL   Monocytes Absolute 0.8  0.1 - 1.0 K/uL   Eosinophils Absolute 0.0  0.0 - 0.7 K/uL   Basophils Absolute 0.0  0.0 - 0.1 K/uL   RBC Morphology STOMATOCYTES     WBC Morphology ATYPICAL LYMPHOCYTES    URINALYSIS, ROUTINE W REFLEX MICROSCOPIC     Status: Abnormal   Collection Time    02/17/14  9:55 PM      Result Value Ref Range   Color, Urine YELLOW  YELLOW   APPearance CLEAR  CLEAR   Specific Gravity, Urine >1.030 (*) 1.005 - 1.030   pH 5.0  5.0 - 8.0   Glucose, UA 500 (*) NEGATIVE mg/dL   Hgb urine dipstick SMALL (*) NEGATIVE   Bilirubin Urine NEGATIVE  NEGATIVE   Ketones, ur 15 (*) NEGATIVE mg/dL   Protein, ur 100 (*) NEGATIVE mg/dL   Urobilinogen, UA 0.2  0.0 - 1.0 mg/dL   Nitrite NEGATIVE  NEGATIVE   Leukocytes, UA NEGATIVE  NEGATIVE  URINE MICROSCOPIC-ADD ON     Status: None   Collection Time    02/17/14  9:55 PM      Result Value Ref Range   Squamous Epithelial / LPF RARE  RARE   WBC, UA 0-2  <3 WBC/hpf   RBC / HPF 0-2  <3 RBC/hpf   Bacteria, UA RARE  RARE  MRSA PCR SCREENING     Status: None   Collection Time    02/18/14 12:45 AM      Result Value Ref Range   MRSA by PCR NEGATIVE  NEGATIVE  GLUCOSE, CAPILLARY     Status: Abnormal   Collection Time    02/18/14  3:20 AM  Result Value Ref Range    Glucose-Capillary 263 (*) 70 - 99 mg/dL  GLUCOSE, CAPILLARY     Status: Abnormal   Collection Time    02/18/14  5:36 AM      Result Value Ref Range   Glucose-Capillary 293 (*) 70 - 99 mg/dL   Dg Thoracic Spine 4v  02/18/2014   CLINICAL DATA:  T8-T12 fusion.  EXAM: THORACIC SPINE - 4+ VIEW; DG C-ARM 61-120 MIN  COMPARISON:  MRI thoracic spine 02/17/2014  FINDINGS: Intraoperative fluoroscopy is obtained for surgical control purposes. Fluoroscopy time is not recorded. AP and lateral spot compression views over the lower thoracic spine obtained. Specific localization of vertebral levels is limited due to the field of view. The previous MR examination demonstrated a compression fracture at T10 and presumably the examination is centered on the T10 vertebra. Given this assumption, posterior pedicular screws are demonstrated at T8, T9, T11, and T12 levels.  IMPRESSION: Intraoperative fluoroscopy obtained for surgical localization purposes.   Electronically Signed   By: Lucienne Capers M.D.   On: 02/18/2014 02:32   Dg Thoracolumabar Spine  02/18/2014   CLINICAL DATA:  TA to T12 fusion.  EXAM: THORACOLUMBAR SPINE - 2 VIEW  COMPARISON:  MRI thoracic spine 02/17/2014.  Chest 08/15/2011  FINDINGS: Single AP portable view of the mid thoracic and upper lumbar region is obtained for surgical control purposes. Examination is technically limited due to underpenetration in the lumbar region. Determination of specific vertebral levels is not possible with a degree of certainty. Multiple metallic localization markers are projected over the spine at multiple levels.  IMPRESSION: Portable view of the thoracolumbar spine is obtained for surgical localization purposes. Technically limited study.   Electronically Signed   By: Lucienne Capers M.D.   On: 02/18/2014 02:29   Dg Lumbar Spine Complete  02/16/2014   CLINICAL DATA:  80 year old female with low back pain after a fall. Initial encounter.  EXAM: LUMBAR SPINE - COMPLETE  4+ VIEW  COMPARISON:  CT Abdomen and Pelvis 12/25/2006.  FINDINGS: Progressed and bulky diffuse endplate spurring throughout the lumbar spine. Normal lumbar segmentation. Chronic vacuum disc phenomena at L4-L5. No lumbar compression fracture identified. Grossly stable and intact visualized lower thoracic levels. Moderate lower lumbar facet hypertrophy. No pars fracture identified. Extensive Aortoiliac calcified atherosclerosis noted. Grossly intact sacral ala and SI joints.  IMPRESSION: 1.  No acute fracture or listhesis identified in the lumbar spine. 2. Diffuse idiopathic skeletal hyperostosis 3. Chronically advanced L4-L5 disc degeneration.   Electronically Signed   By: Lars Pinks M.D.   On: 02/16/2014 14:46   Mr Thoracic Spine Wo Contrast  02/17/2014   ADDENDUM REPORT: 02/17/2014 21:41  ADDENDUM: Critical Value/emergent results were called by telephone at the time of interpretation on 02/17/2014 at 9:40 pm to Dr. Ripley Fraise , who verbally acknowledged these results.   Electronically Signed   By: Franchot Gallo M.D.   On: 02/17/2014 21:41   02/17/2014   CLINICAL DATA:  [place] yesterday.  Back pain.  Bilateral leg weakness  EXAM: MRI THORACIC SPINE WITHOUT CONTRAST  TECHNIQUE: Multiplanar, multisequence MR imaging of the thoracic spine was performed. No intravenous contrast was administered.  COMPARISON:  None.  FINDINGS: Displaced chance fracture of T10. Horizontal fracture through the T10 vertebral body with hematoma. There is anterior displacement of the upper half of T10 on the lower half by approximately 6 mm. Fracture extends into the pedicles bilaterally. There is probable epidural hematoma. There is cord compression and mild  cord signal abnormality. This fracture is unstable.  No other fracture.  No disc protrusion identified.  IMPRESSION: Displaced chance fracture through the T10 vertebral body. There is spinal stenosis and mild cord compression and mild cord edema. This fracture is unstable.   Electronically Signed: By: Franchot Gallo M.D. On: 02/17/2014 21:31   Dg C-arm 61-120 Min  02/18/2014   CLINICAL DATA:  T8-T12 fusion.  EXAM: THORACIC SPINE - 4+ VIEW; DG C-ARM 61-120 MIN  COMPARISON:  MRI thoracic spine 02/17/2014  FINDINGS: Intraoperative fluoroscopy is obtained for surgical control purposes. Fluoroscopy time is not recorded. AP and lateral spot compression views over the lower thoracic spine obtained. Specific localization of vertebral levels is limited due to the field of view. The previous MR examination demonstrated a compression fracture at T10 and presumably the examination is centered on the T10 vertebra. Given this assumption, posterior pedicular screws are demonstrated at T8, T9, T11, and T12 levels.  IMPRESSION: Intraoperative fluoroscopy obtained for surgical localization purposes.   Electronically Signed   By: Lucienne Capers M.D.   On: 02/18/2014 02:32    Assessment/Plan: Diagnosis: T10 fx with complete paraplegia 1. Does the need for close, 24 hr/day medical supervision in concert with the patient's rehab needs make it unreasonable for this patient to be served in a less intensive setting? Yes 2. Co-Morbidities requiring supervision/potential complications: neurogenic bowel,bladder 3. Due to bladder management, bowel management, safety, skin/wound care, disease management, medication administration, pain management and patient education, does the patient require 24 hr/day rehab nursing? Yes 4. Does the patient require coordinated care of a physician, rehab nurse, PT (1-2 hrs/day, 5 days/week) and OT (1-2 hrs/day, 5 days/week) to address physical and functional deficits in the context of the above medical diagnosis(es)? Yes Addressing deficits in the following areas: balance, endurance, locomotion, strength, transferring, bowel/bladder control, bathing, dressing, feeding, grooming, toileting and psychosocial support 5. Can the patient actively participate in an intensive  therapy program of at least 3 hrs of therapy per day at least 5 days per week? Yes and Potentially 6. The potential for patient to make measurable gains while on inpatient rehab is good 7. Anticipated functional outcomes upon discharge from inpatient rehab are min assist  with PT, supervision, min assist and mod assist with OT, n/a with SLP. 8. Estimated rehab length of stay to reach the above functional goals is: 20-30 days 9. Does the patient have adequate social supports to accommodate these discharge functional goals? Yes and Potentially 10. Anticipated D/C setting: Home 11. Anticipated post D/C treatments: HH therapy and Outpatient therapy 12. Overall Rehab/Functional Prognosis: good  RECOMMENDATIONS: This patient's condition is appropriate for continued rehabilitative care in the following setting: CIR Patient has agreed to participate in recommended program. Yes Note that insurance prior authorization may be required for reimbursement for recommended care.  Comment: Will follow along for now.  Meredith Staggers, MD, [facility]     02/18/2014

## 2014-02-18 NOTE — Progress Notes (Signed)
UR completed.  Tamim Skog, RN BSN M[facility] CCM Trauma/Neuro ICU Case Manager [phone]  

## 2014-02-18 NOTE — Progress Notes (Signed)
eLink Physician-Brief Progress Note Patient Name: Yolanda Solis DOB: 01/17/1934 MRN: [id number]   Date of Service  02/18/2014  HPI/Events of Note   80 yo s/p fall 8/27.  Unable to move legs 7/28.  Bought to [facility] and found to have displaced T10 fracture and paraplegia.  Transferred to [facility] and underwent T8-T12 fusion.  Extubated in OR.  Brought to 35M hemodynamically stable.  PCCM consulted for medical management (DM, AF, HTN)   eICU Interventions   No emergent interventions are necessary. Bedside provider to see.      Intervention Category Evaluation Type: New Patient Evaluation  Yolanda Solis 02/18/2014, 3:25 AM

## 2014-02-19 LAB — GLUCOSE, CAPILLARY
GLUCOSE-CAPILLARY: 157 mg/dL — AB (ref 70–99)
GLUCOSE-CAPILLARY: 176 mg/dL — AB (ref 70–99)
Glucose-Capillary: 126 mg/dL — ABNORMAL HIGH (ref 70–99)
Glucose-Capillary: 143 mg/dL — ABNORMAL HIGH (ref 70–99)
Glucose-Capillary: 176 mg/dL — ABNORMAL HIGH (ref 70–99)
Glucose-Capillary: 153 mg/dL — ABNORMAL HIGH (ref 70–99)
Glucose-Capillary: 193 mg/dL — ABNORMAL HIGH (ref 70–99)
Glucose-Capillary: 251 mg/dL — ABNORMAL HIGH (ref 70–99)
Glucose-Capillary: 167 mg/dL — ABNORMAL HIGH (ref 70–99)
Glucose-Capillary: 164 mg/dL — ABNORMAL HIGH (ref 70–99)

## 2014-02-19 LAB — BASIC METABOLIC PANEL
ANION GAP: 12 (ref 5–15)
BUN: 25 mg/dL — ABNORMAL HIGH (ref 6–23)
CHLORIDE: 103 meq/L (ref 96–112)
CO2: 23 meq/L (ref 19–32)
Calcium: 8.3 mg/dL — ABNORMAL LOW (ref 8.4–10.5)
Creatinine, Ser: 0.95 mg/dL (ref 0.50–1.10)
GFR calc non Af Amer: 56 mL/min — ABNORMAL LOW (ref >90)
GFR, EST AFRICAN AMERICAN: 65 mL/min — AB (ref >90)
Glucose, Bld: 153 mg/dL — ABNORMAL HIGH (ref 70–99)
POTASSIUM: 4.1 meq/L (ref 3.7–5.3)
SODIUM: 138 meq/L (ref 137–147)

## 2014-02-19 LAB — URINE CULTURE
Colony Count: NO GROWTH
Culture: NO GROWTH

## 2014-02-19 LAB — CBC
HCT: 32.7 % — ABNORMAL LOW (ref 36.0–46.0)
Hemoglobin: 10.6 g/dL — ABNORMAL LOW (ref 12.0–15.0)
MCH: 29.7 pg (ref 26.0–34.0)
MCHC: 32.4 g/dL (ref 30.0–36.0)
MCV: 91.6 fL (ref 78.0–100.0)
PLATELETS: 200 10*3/uL (ref 150–400)
RBC: 3.57 MIL/uL — ABNORMAL LOW (ref 3.87–5.11)
RDW: 13.5 % (ref 11.5–15.5)
WBC: 18.3 10*3/uL — AB (ref 4.0–10.5)

## 2014-02-19 LAB — HEMOGLOBIN A1C
HEMOGLOBIN A1C: 7.9 % — AB (ref <5.7)
MEAN PLASMA GLUCOSE: 180 mg/dL — AB (ref <117)

## 2014-02-19 LAB — PHOSPHORUS: PHOSPHORUS: 2.1 mg/dL — AB (ref 2.3–4.6)

## 2014-02-19 LAB — PROCALCITONIN: PROCALCITONIN: 0.66 ng/mL

## 2014-02-19 LAB — MAGNESIUM: MAGNESIUM: 1.7 mg/dL (ref 1.5–2.5)

## 2014-02-19 MED ORDER — HEPARIN (PORCINE) IN NACL 100-0.45 UNIT/ML-% IJ SOLN
1100.0000 [IU]/h | INTRAMUSCULAR | Status: DC
  Administered 2014-02-19: 1100 [IU]/h via INTRAVENOUS
  Filled 2014-02-19: qty 250

## 2014-02-19 MED ORDER — HEPARIN BOLUS VIA INFUSION
2000.0000 [IU] | Freq: Once | INTRAVENOUS | Status: AC
  Administered 2014-02-19: 2000 [IU] via INTRAVENOUS
  Filled 2014-02-19: qty 2000

## 2014-02-19 MED ORDER — MAGNESIUM SULFATE 40 MG/ML IJ SOLN
2.0000 g | Freq: Once | INTRAMUSCULAR | Status: AC
  Administered 2014-02-19: 2 g via INTRAVENOUS
  Filled 2014-02-19: qty 50

## 2014-02-19 MED ORDER — HEPARIN SODIUM (PORCINE) 5000 UNIT/ML IJ SOLN
5000.0000 [IU] | Freq: Three times a day (TID) | INTRAMUSCULAR | Status: DC
  Administered 2014-02-19 – 2014-02-23 (×11): 5000 [IU] via SUBCUTANEOUS
  Filled 2014-02-19 (×14): qty 1

## 2014-02-19 MED ORDER — SODIUM PHOSPHATE 3 MMOLE/ML IV SOLN
30.0000 mmol | Freq: Once | INTRAVENOUS | Status: AC
  Administered 2014-02-19: 30 mmol via INTRAVENOUS
  Filled 2014-02-19: qty 10

## 2014-02-19 MED ORDER — DILTIAZEM HCL 30 MG PO TABS
60.0000 mg | ORAL_TABLET | Freq: Four times a day (QID) | ORAL | Status: DC
  Administered 2014-02-19 – 2014-02-23 (×18): 60 mg via ORAL
  Filled 2014-02-19 (×2): qty 2
  Filled 2014-02-19 (×10): qty 1
  Filled 2014-02-19: qty 2
  Filled 2014-02-19: qty 1
  Filled 2014-02-19 (×2): qty 2
  Filled 2014-02-19: qty 1
  Filled 2014-02-19: qty 2

## 2014-02-19 MED ORDER — PANTOPRAZOLE SODIUM 40 MG PO TBEC
40.0000 mg | DELAYED_RELEASE_TABLET | Freq: Every day | ORAL | Status: DC
  Administered 2014-02-19 – 2014-02-23 (×5): 40 mg via ORAL
  Filled 2014-02-19 (×5): qty 1

## 2014-02-19 NOTE — Progress Notes (Addendum)
PULMONARY / CRITICAL CARE MEDICINE   Name: Yolanda Solis MRN: [id number] DOB: 10-May-1934    ADMISSION DATE:  02/17/2014 CONSULTATION DATE:  02/18/2014  REFERRING MD :  Dr. Vertell Limber  CHIEF COMPLAINT:  BLE weakness  INITIAL PRESENTATION: 80 year old female presented to [facility] 8/27 s/p fall on 8/26. She was complaining of weakness to bilateral lower extremities. In ED found to have unstable T10 fracture. She was transferred to [facility] for emergent stabilization of fracture and decompression of spinal cord. To ICU post-operatively.   STUDIES:  8/27 MRI T-spine > Displaced chance fracture through the T10 vertebral body. There is spinal stenosis and mild cord compression and mild cord edema. This fracture is unstable.  SIGNIFICANT EVENTS: 8/26 fall, to [facility]. Discharged after Xrays negative 8/27 Returned to ED, Fx found. Transfer to [facility] for emergent surgery.   SUBJECTIVE: No other events overnight.  VITAL SIGNS: Temp:  [97.7 F (36.5 C)-98.9 F (37.2 C)] 98.3 F (36.8 C) (08/29 0300) Pulse Rate:  [59-110] 110 (08/29 0700) Resp:  [12-32] 13 (08/29 0700) BP: (102-149)/(43-75) 119/60 mmHg (08/29 0700) SpO2:  [90 %-99 %] 97 % (08/29 0700) Arterial Line BP: (83-157)/(54-80) 83/80 mmHg (08/28 1900) HEMODYNAMICS:   VENTILATOR SETTINGS:   INTAKE / OUTPUT:  Intake/Output Summary (Last 24 hours) at 02/19/14 0751 Last data filed at 02/19/14 0600  Gross per 24 hour  Intake 1024.61 ml  Output   1195 ml  Net -170.39 ml    PHYSICAL EXAMINATION: General:  Elderly female in no acute distress Neuro:  Spontaneously awake, alert, oriented, mild confusion HEENT:  [place]/AT, PERRL, no JVD noted Cardiovascular:  Irreg Irreg, rate controlled.  Lungs:  Clear anteriorly  Abdomen:  Soft, non-tender, non-distended Musculoskeletal:  No acute deformity. No movement of BLE Skin:  Intact, no sensation to BLE  LABS:  CBC  Recent Labs Lab 02/17/14 2008 02/18/14 0620 02/19/14 0230  WBC 20.2* 16.2*  18.3*  HGB 13.4 11.5* 10.6*  HCT 40.1 35.8* 32.7*  PLT 223 191 200   Coag's No results found for this basename: APTT, INR,  in the last 168 hours  BMET  Recent Labs Lab 02/17/14 2008 02/18/14 0620 02/19/14 0230  NA 137 138 138  K 3.9 4.3 4.1  CL 95* 100 103  CO2 24 23 23   BUN 19 21 25*  CREATININE 0.82 0.79 0.95  GLUCOSE 275* 334* 153*   Electrolytes  Recent Labs Lab 02/17/14 2008 02/18/14 0620 02/19/14 0230  CALCIUM 9.3 8.2* 8.3*  MG  --   --  1.7  PHOS  --   --  2.1*   Sepsis Markers  Recent Labs Lab 02/18/14 0620 02/19/14 0230  PROCALCITON 0.51 0.66   ABG No results found for this basename: PHART, PCO2ART, PO2ART,  in the last 168 hours Liver Enzymes No results found for this basename: AST, ALT, ALKPHOS, BILITOT, ALBUMIN,  in the last 168 hours Cardiac Enzymes No results found for this basename: TROPONINI, PROBNP,  in the last 168 hours Glucose  Recent Labs Lab 02/18/14 2012 02/18/14 2123 02/18/14 2200 02/18/14 2259 02/19/14 0005 02/19/14 0340  GLUCAP 121* 126* 143* 176* 157* 153*    Imaging Dg Thoracic Spine 4v  02/18/2014   CLINICAL DATA:  T8-T12 fusion.  EXAM: THORACIC SPINE - 4+ VIEW; DG C-ARM 61-120 MIN  COMPARISON:  MRI thoracic spine 02/17/2014  FINDINGS: Intraoperative fluoroscopy is obtained for surgical control purposes. Fluoroscopy time is not recorded. AP and lateral spot compression views over the lower thoracic spine  obtained. Specific localization of vertebral levels is limited due to the field of view. The previous MR examination demonstrated a compression fracture at T10 and presumably the examination is centered on the T10 vertebra. Given this assumption, posterior pedicular screws are demonstrated at T8, T9, T11, and T12 levels.  IMPRESSION: Intraoperative fluoroscopy obtained for surgical localization purposes.   Electronically Signed   By: Lucienne Capers M.D.   On: 02/18/2014 02:32   Dg Thoracolumabar Spine  02/18/2014    CLINICAL DATA:  TA to T12 fusion.  EXAM: THORACOLUMBAR SPINE - 2 VIEW  COMPARISON:  MRI thoracic spine 02/17/2014.  Chest 08/15/2011  FINDINGS: Single AP portable view of the mid thoracic and upper lumbar region is obtained for surgical control purposes. Examination is technically limited due to underpenetration in the lumbar region. Determination of specific vertebral levels is not possible with a degree of certainty. Multiple metallic localization markers are projected over the spine at multiple levels.  IMPRESSION: Portable view of the thoracolumbar spine is obtained for surgical localization purposes. Technically limited study.   Electronically Signed   By: Lucienne Capers M.D.   On: 02/18/2014 02:29   Dg C-arm 61-120 Min  02/18/2014   CLINICAL DATA:  T8-T12 fusion.  EXAM: THORACIC SPINE - 4+ VIEW; DG C-ARM 61-120 MIN  COMPARISON:  MRI thoracic spine 02/17/2014  FINDINGS: Intraoperative fluoroscopy is obtained for surgical control purposes. Fluoroscopy time is not recorded. AP and lateral spot compression views over the lower thoracic spine obtained. Specific localization of vertebral levels is limited due to the field of view. The previous MR examination demonstrated a compression fracture at T10 and presumably the examination is centered on the T10 vertebra. Given this assumption, posterior pedicular screws are demonstrated at T8, T9, T11, and T12 levels.  IMPRESSION: Intraoperative fluoroscopy obtained for surgical localization purposes.   Electronically Signed   By: Lucienne Capers M.D.   On: 02/18/2014 02:32   ASSESSMENT / PLAN:  PULMONARY A: No acute issues  P:   IS per RT protocol Titrate O2 for sat of 88-92%.  CARDIOVASCULAR A:  Atrial Fibrillation - appears to be new onset, had prior to surgery at [facility]. Currently rate controlled.  H/o HTN  P:  Continuous cardiac monitoring Start SQ heparin but no full anti-coag for a-fib until 5-7 days out from surgery. Continue preadmission  antihypertensives BP goals per neurosurgery D/C dilt drip and increase PO to 60 mg PO q6.  RENAL A:   Hypo Mag  P:   Follow Bmet Replace electrolytes as indicated. KVO IVF.  GASTROINTESTINAL A:   No acute issues  P:   SUP: IV protonix Carb modified diet  HEMATOLOGIC A:   No acute issues  P:  Follow CBC SQ heparin, no full anticoag until 5-7 days post surgery. SCD's  INFECTIOUS A:   SIRS, no obvious source Leukocytosis  P:   Urine Cx 8/27 >>>NTD Will hold off abx for now.  ENDOCRINE A:  DM  P:   CBG monitoring. SSI. Hold preadmission metformin. Insulin drip. Diabetic coordinator to assist with management.  NEUROLOGIC A:   S/p surgical repair of T10 fracture with spinal cord compression Paraplegia BLE Acute encephalopahty  P:   Per neurosurgery Pain management per neurosurgery Monitor clinically PT/OT and OOB to chair. Continue valium to home dose.  I have personally obtained a history, examined the patient, evaluated laboratory and imaging results, formulated the assessment and plan and placed orders.  Rush Farmer, M.D. [facility] Pulmonary/Critical Care Medicine.  Pager: [phone]. After hours pager: [phone].

## 2014-02-19 NOTE — Progress Notes (Signed)
Pt seen and examined. No issues overnight.   EXAM: Temp:  [97.9 F (36.6 C)-98.9 F (37.2 C)] 97.9 F (36.6 C) (08/29 0820) Pulse Rate:  [59-110] 110 (08/29 0700) Resp:  [12-32] 13 (08/29 0700) BP: (102-135)/(43-67) 119/60 mmHg (08/29 0700) SpO2:  [90 %-98 %] 97 % (08/29 0700) Arterial Line BP: (83-157)/(54-80) 83/80 mmHg (08/28 1900) Intake/Output     08/28 0701 - 08/29 0700 08/29 0701 - 08/30 0700   P.O. 480    I.V. (mL/kg) 234.6 (2.5)    IV Piggyback 310    Total Intake(mL/kg) 1024.6 (10.8)    Urine (mL/kg/hr) 1000 (0.4)    Drains 195 (0.1)    Blood     Total Output 1195     Net -170.4           Awake, alert, confused Moving BUE with good strength No movement/sensation BLE Drain in place, ~200cc overnight  LABS: Lab Results  Component Value Date   CREATININE 0.95 02/19/2014   BUN 25* 02/19/2014   NA 138 02/19/2014   K 4.1 02/19/2014   CL 103 02/19/2014   CO2 23 02/19/2014   Lab Results  Component Value Date   WBC 18.3* 02/19/2014   HGB 10.6* 02/19/2014   HCT 32.7* 02/19/2014   MCV 91.6 02/19/2014   PLT 200 02/19/2014    IMPRESSION: - 80 y.o. female POD#2 s/p decompression/stabilization of T10 chance fx - Stable T10 ASIA A - New onset A-fib  PLAN: - Cont observation in ICU - Ok for prophylactic dose of heparin/lovenox but would not start full dose anticoagulation for a-fib immediately postop.

## 2014-02-19 NOTE — Progress Notes (Signed)
[place] Progress Note Patient Name: Yolanda Solis DOB: [birth year] MRN: [id number]   Date of Service  02/19/2014  HPI/Events of Note  Hypophosphtemia  eICU Interventions  Phos replaced     Intervention Category Intermediate Interventions: Electrolyte abnormality - evaluation and management  [name] 02/19/2014, 4:14 AM

## 2014-02-19 NOTE — Progress Notes (Signed)
ANTICOAGULATION CONSULT NOTE - Initial Consult  Pharmacy Consult for heparin Indication: atrial fibrillation  No Known Allergies  Patient Measurements: Height: 5\' 4"  (162.6 cm) Weight: 208 lb 12.4 oz (94.7 kg) IBW/kg (Calculated) : 54.7 Heparin Dosing Weight: 78 kg  Vital Signs: Temp: 98.3 F (36.8 C) (08/29 0300) Temp src: Oral (08/29 0300) BP: 119/60 mmHg (08/29 0700) Pulse Rate: 110 (08/29 0700)  Labs:  Recent Labs  02/17/14 2008 02/18/14 0620 02/19/14 0230  HGB 13.4 11.5* 10.6*  HCT 40.1 35.8* 32.7*  PLT 223 191 200  CREATININE 0.82 0.79 0.95    Estimated Creatinine Clearance: 55.4 ml/min (by C-G formula based on Cr of 0.95).   Medical History: Past Medical History  Diagnosis Date  . Diabetes mellitus   . Hypertension   . Arthritis     Medications:  Scheduled:  . antiseptic oral rinse  7 mL Mouth Rinse BID  . diltiazem  60 mg Oral 4 times per day  . docusate sodium  100 mg Oral BID  . insulin aspart  2-6 Units Subcutaneous 6 times per day  . lisinopril  5 mg Oral Daily  . loratadine  10 mg Oral Daily  . magnesium sulfate 1 - 4 g bolus IVPB  2 g Intravenous Once  . pantoprazole (PROTONIX) IV  40 mg Intravenous QHS  . senna  1 tablet Oral BID  . sodium chloride  3 mL Intravenous Q12H  . sodium phosphate  Dextrose 5% IVPB  30 mmol Intravenous Once   Infusions:  . sodium chloride    . insulin (NOVOLIN-R) infusion Stopped (02/18/14 2330)    Assessment: 80 year old woman s/p surgery on 8/28 for T10 fracture with acute paraplegia.  Patient found to be in AFib.  Heparin drip to start today.  Anticipate transition to warfarin Goal of Therapy:  Heparin level 0.3-0.7 units/ml Monitor platelets by anticoagulation protocol: Yes   Plan:  Give 2000 units bolus x 1 Start heparin infusion at 1100 units/hr Check anti-Xa level in 8 hours and daily while on heparin Continue to monitor H&H and platelets  Candie Mile 02/19/2014,8:07 AM

## 2014-02-20 LAB — CBC
HCT: 33.8 % — ABNORMAL LOW (ref 36.0–46.0)
Hemoglobin: 10.7 g/dL — ABNORMAL LOW (ref 12.0–15.0)
MCH: 29.7 pg (ref 26.0–34.0)
MCHC: 31.7 g/dL (ref 30.0–36.0)
MCV: 93.9 fL (ref 78.0–100.0)
PLATELETS: 175 10*3/uL (ref 150–400)
RBC: 3.6 MIL/uL — ABNORMAL LOW (ref 3.87–5.11)
RDW: 13.5 % (ref 11.5–15.5)
WBC: 12.6 10*3/uL — AB (ref 4.0–10.5)

## 2014-02-20 LAB — GLUCOSE, CAPILLARY
Glucose-Capillary: 143 mg/dL — ABNORMAL HIGH (ref 70–99)
Glucose-Capillary: 145 mg/dL — ABNORMAL HIGH (ref 70–99)
Glucose-Capillary: 154 mg/dL — ABNORMAL HIGH (ref 70–99)
Glucose-Capillary: 155 mg/dL — ABNORMAL HIGH (ref 70–99)
Glucose-Capillary: 164 mg/dL — ABNORMAL HIGH (ref 70–99)
Glucose-Capillary: 180 mg/dL — ABNORMAL HIGH (ref 70–99)

## 2014-02-20 LAB — BASIC METABOLIC PANEL
Anion gap: 12 (ref 5–15)
BUN: 27 mg/dL — ABNORMAL HIGH (ref 6–23)
CHLORIDE: 100 meq/L (ref 96–112)
CO2: 27 mEq/L (ref 19–32)
CREATININE: 1.11 mg/dL — AB (ref 0.50–1.10)
Calcium: 8.3 mg/dL — ABNORMAL LOW (ref 8.4–10.5)
GFR, EST AFRICAN AMERICAN: 54 mL/min — AB (ref >90)
GFR, EST NON AFRICAN AMERICAN: 47 mL/min — AB (ref >90)
Glucose, Bld: 134 mg/dL — ABNORMAL HIGH (ref 70–99)
Potassium: 3.9 mEq/L (ref 3.7–5.3)
Sodium: 139 mEq/L (ref 137–147)

## 2014-02-20 LAB — PROCALCITONIN: PROCALCITONIN: 0.47 ng/mL

## 2014-02-20 LAB — HEPARIN LEVEL (UNFRACTIONATED): Heparin Unfractionated: <0.1 IU/mL — ABNORMAL LOW (ref 0.30–0.70)

## 2014-02-20 LAB — MAGNESIUM: Magnesium: 2.2 mg/dL (ref 1.5–2.5)

## 2014-02-20 LAB — PHOSPHORUS: Phosphorus: 3.5 mg/dL (ref 2.3–4.6)

## 2014-02-20 NOTE — Progress Notes (Signed)
Pt seen and examined. Pt had sustained run of Vtach last night without hypotension or change in MS. Currently doing well, with back soreness. No CP/SOB  EXAM: Temp:  [97.4 F (36.3 C)-98.5 F (36.9 C)] 97.4 F (36.3 C) (08/30 0757) Pulse Rate:  [53-84] 69 (08/30 0800) Resp:  [13-19] 16 (08/30 0800) BP: (92-140)/(38-72) 137/56 mmHg (08/30 0800) SpO2:  [94 %-99 %] 99 % (08/30 0800) Intake/Output     08/29 0701 - 08/30 0700 08/30 0701 - 08/31 0700   P.O. 360 120   I.V. (mL/kg) 26.7 (0.3)    IV Piggyback     Total Intake(mL/kg) 386.7 (4.1) 120 (1.3)   Urine (mL/kg/hr) 620 (0.3)    Drains 60 (0)    Total Output 680     Net -293.4 +120         Awake, alert Speech fluent Moving BUE well No movement BLE, no sensation  LABS: Lab Results  Component Value Date   CREATININE 1.11* 02/20/2014   BUN 27* 02/20/2014   NA 139 02/20/2014   K 3.9 02/20/2014   CL 100 02/20/2014   CO2 27 02/20/2014   Lab Results  Component Value Date   WBC 12.6* 02/20/2014   HGB 10.7* 02/20/2014   HCT 33.8* 02/20/2014   MCV 93.9 02/20/2014   PLT 175 02/20/2014    IMPRESSION: - 80 y.o. female s/p T10 decompression/stabilization for chance fracture - T10 ASIA A - Afib  PLAN: - Cont continuous cardiac monitoring in ICU - D/C drain - On cardizem PO for rate control. Will consider if/when to start anticoagulation for Afib

## 2014-02-20 NOTE — Progress Notes (Signed)
Pt had 21 beat run of vtach. Pt asymptomatic. MD notified of patient condition. Will continue to monitor pt cardiac rhythm.

## 2014-02-20 NOTE — Progress Notes (Signed)
PULMONARY / CRITICAL CARE MEDICINE   Name: Yolanda Solis MRN: [id number] DOB: 1933/08/30    ADMISSION DATE:  02/17/2014 CONSULTATION DATE:  02/18/2014  REFERRING MD :  Dr. Vertell Limber  CHIEF COMPLAINT:  BLE weakness  INITIAL PRESENTATION: 80 year old female presented to [facility] 8/27 s/p fall on 8/26. She was complaining of weakness to bilateral lower extremities. In ED found to have unstable T10 fracture. She was transferred to [facility] for emergent stabilization of fracture and decompression of spinal cord. To ICU post-operatively.   STUDIES:  8/27 MRI T-spine > Displaced chance fracture through the T10 vertebral body. There is spinal stenosis and mild cord compression and mild cord edema. This fracture is unstable.  SIGNIFICANT EVENTS: 8/26 fall, to [facility]. Discharged after Xrays negative 8/27 Returned to ED, Fx found. Transfer to [facility] for emergent surgery.   SUBJECTIVE: No other events overnight.  VITAL SIGNS: Temp:  [97.4 F (36.3 C)-98.5 F (36.9 C)] 97.4 F (36.3 C) (08/30 0757) Pulse Rate:  [53-84] 69 (08/30 0800) Resp:  [13-19] 16 (08/30 0800) BP: (92-140)/(38-72) 137/56 mmHg (08/30 0800) SpO2:  [94 %-99 %] 99 % (08/30 0800) HEMODYNAMICS:   VENTILATOR SETTINGS:   INTAKE / OUTPUT:  Intake/Output Summary (Last 24 hours) at 02/20/14 0850 Last data filed at 02/20/14 0800  Gross per 24 hour  Intake 506.65 ml  Output    680 ml  Net -173.35 ml    PHYSICAL EXAMINATION: General:  Elderly female in no acute distress Neuro:  Spontaneously awake, alert, oriented, mild confusion HEENT:  [place]/AT, PERRL, no JVD noted Cardiovascular:  Irreg Irreg, rate controlled.  Lungs:  Clear anteriorly  Abdomen:  Soft, non-tender, non-distended Musculoskeletal:  No acute deformity. No movement of BLE Skin:  Intact, no sensation to BLE  LABS:  CBC  Recent Labs Lab 02/18/14 0620 02/19/14 0230 02/20/14 0225  WBC 16.2* 18.3* 12.6*  HGB 11.5* 10.6* 10.7*  HCT 35.8* 32.7* 33.8*  PLT 191 200  175   Coag's No results found for this basename: APTT, INR,  in the last 168 hours  BMET  Recent Labs Lab 02/18/14 0620 02/19/14 0230 02/20/14 0225  NA 138 138 139  K 4.3 4.1 3.9  CL 100 103 100  CO2 23 23 27   BUN 21 25* 27*  CREATININE 0.79 0.95 1.11*  GLUCOSE 334* 153* 134*   Electrolytes  Recent Labs Lab 02/18/14 0620 02/19/14 0230 02/20/14 0225  CALCIUM 8.2* 8.3* 8.3*  MG  --  1.7 2.2  PHOS  --  2.1* 3.5   Sepsis Markers  Recent Labs Lab 02/18/14 0620 02/19/14 0230 02/20/14 0225  PROCALCITON 0.51 0.66 0.47   ABG No results found for this basename: PHART, PCO2ART, PO2ART,  in the last 168 hours Liver Enzymes No results found for this basename: AST, ALT, ALKPHOS, BILITOT, ALBUMIN,  in the last 168 hours Cardiac Enzymes No results found for this basename: TROPONINI, PROBNP,  in the last 168 hours Glucose  Recent Labs Lab 02/19/14 1215 02/19/14 1626 02/19/14 1943 02/19/14 2307 02/20/14 0334 02/20/14 0755  GLUCAP 251* 176* 167* 164* 154* 164*    Imaging No results found. ASSESSMENT / PLAN:  PULMONARY A: No acute issues  P:   IS per RT protocol Titrate O2 for sat of 88-92%.  CARDIOVASCULAR A:  Atrial Fibrillation - appears to be new onset, had prior to surgery at [facility]. Currently rate controlled.  H/o HTN  P:  Continuous cardiac monitoring Start SQ heparin but no full anti-coag for a-fib  until 5-7 days out from surgery. Continue preadmission antihypertensives BP goals per neurosurgery D/C dilt drip and increase PO to 60 mg PO q6.  RENAL A:   Hypo Mag, resolved  P:   Follow Bmet Replace electrolytes as indicated. KVO IVF.  GASTROINTESTINAL A:   No acute issues  P:   SUP: IV protonix Carb modified diet  HEMATOLOGIC A:   No acute issues  P:  Follow CBC SQ heparin, no full anticoag until 5-7 days post surgery. SCD's  INFECTIOUS A:   SIRS, no obvious source Leukocytosis  P:   Urine Cx 8/27 >>>NTD Will hold off  abx for now.  ENDOCRINE A:  DM  P:   CBG monitoring. SSI. Hold preadmission metformin. Insulin drip. Diabetic coordinator to assist with management.  NEUROLOGIC A:   S/p surgical repair of T10 fracture with spinal cord compression Paraplegia BLE Acute encephalopahty  P:   Per neurosurgery Pain management per neurosurgery Monitor clinically PT/OT and OOB to chair. Continue valium to home dose.  I have personally obtained a history, examined the patient, evaluated laboratory and imaging results, formulated the assessment and plan and placed orders.  Rush Farmer, M.D. [facility] Pulmonary/Critical Care Medicine. Pager: [phone]. After hours pager: [phone].

## 2014-02-21 DIAGNOSIS — IMO0002 Reserved for concepts with insufficient information to code with codable children: Secondary | ICD-10-CM

## 2014-02-21 LAB — BASIC METABOLIC PANEL
Anion gap: 11 (ref 5–15)
BUN: 22 mg/dL (ref 6–23)
CO2: 27 mEq/L (ref 19–32)
Calcium: 8.4 mg/dL (ref 8.4–10.5)
Chloride: 100 mEq/L (ref 96–112)
Creatinine, Ser: 0.83 mg/dL (ref 0.50–1.10)
GFR calc Af Amer: 77 mL/min — ABNORMAL LOW (ref >90)
GFR calc non Af Amer: 66 mL/min — ABNORMAL LOW (ref >90)
GLUCOSE: 148 mg/dL — AB (ref 70–99)
POTASSIUM: 3.9 meq/L (ref 3.7–5.3)
Sodium: 138 mEq/L (ref 137–147)

## 2014-02-21 LAB — GLUCOSE, CAPILLARY
GLUCOSE-CAPILLARY: 162 mg/dL — AB (ref 70–99)
GLUCOSE-CAPILLARY: 162 mg/dL — AB (ref 70–99)
GLUCOSE-CAPILLARY: 259 mg/dL — AB (ref 70–99)
GLUCOSE-CAPILLARY: 215 mg/dL — AB (ref 70–99)
Glucose-Capillary: 166 mg/dL — ABNORMAL HIGH (ref 70–99)

## 2014-02-21 LAB — CBC
HCT: 35.7 % — ABNORMAL LOW (ref 36.0–46.0)
HEMOGLOBIN: 11.5 g/dL — AB (ref 12.0–15.0)
MCH: 29.6 pg (ref 26.0–34.0)
MCHC: 32.2 g/dL (ref 30.0–36.0)
MCV: 91.8 fL (ref 78.0–100.0)
Platelets: 206 10*3/uL (ref 150–400)
RBC: 3.89 MIL/uL (ref 3.87–5.11)
RDW: 13.4 % (ref 11.5–15.5)
WBC: 12.2 10*3/uL — ABNORMAL HIGH (ref 4.0–10.5)

## 2014-02-21 LAB — MAGNESIUM: MAGNESIUM: 1.8 mg/dL (ref 1.5–2.5)

## 2014-02-21 LAB — PHOSPHORUS: PHOSPHORUS: 3.4 mg/dL (ref 2.3–4.6)

## 2014-02-21 NOTE — Progress Notes (Signed)
Patient arrived to 4N via stretcher in stable condition. Patient assessed and oriented to the room and unit. Telemetry placed. Patient laying in bed and call light is in reach. Will continue to monitor patient closely. Burnell Blanks, RN

## 2014-02-21 NOTE — Progress Notes (Signed)
Patient has not had any rvr throughout night. Will continue to monitor.

## 2014-02-21 NOTE — Progress Notes (Signed)
I met with pt at bedside and then contacted her son, Jori Moll, by phone to discuss pt's rehab needs and planning. He needs to discuss with family what assistance they can provide. I will begin insurance approval for a possible inpt rehab admission when medically ready. I will follow up tomorrow. [phone]

## 2014-02-21 NOTE — Progress Notes (Signed)
Subjective: Patient reports the same.  No change in leg strength  Objective: Vital signs in last 24 hours: Temp:  [97.2 F (36.2 C)-98 F (36.7 C)] 98 F (36.7 C) (08/31 0400) Pulse Rate:  [56-88] 56 (08/31 0726) Resp:  [10-20] 12 (08/31 0726) BP: (98-146)/(41-129) 107/52 mmHg (08/31 0726) SpO2:  [93 %-99 %] 97 % (08/31 0726)  Intake/Output from previous day: 08/30 0701 - 08/31 0700 In: 360 [P.O.:360] Out: 1215 [Urine:1215] Intake/Output this shift:    Physical Exam: Still complete paraplegia with T11 sensory level.  A Fib persists.  Lab Results:  Recent Labs  02/20/14 0225 02/21/14 0231  WBC 12.6* 12.2*  HGB 10.7* 11.5*  HCT 33.8* 35.7*  PLT 175 206   BMET  Recent Labs  02/20/14 0225 02/21/14 0231  NA 139 138  K 3.9 3.9  CL 100 100  CO2 27 27  GLUCOSE 134* 148*  BUN 27* 22  CREATININE 1.11* 0.83  CALCIUM 8.3* 8.4    Studies/Results: No results found.  Assessment/Plan: Mobilize with PT in brace.  May consider low dose anticoagulation (not full) because of recent surgery on spinal cord.  Rehab to evaluate.  Prognosis for meaningful recovery of neurologic function is poor.  OK for stepdown, depending on CCM.    LOS: 4 days    Peggyann Shoals, MD 02/21/2014, 7:31 AM

## 2014-02-21 NOTE — Progress Notes (Signed)
Occupational Therapy Evaluation Patient Details Name: Yolanda Solis MRN: [id number] DOB: [birth year] Today's Date: 02/21/2014    History of Present Illness 80 year old female presented to [facility] 8/26 s/p fall. D/C home. Returned to [facility] ED 8/27 with c/o BLE numbness. Transferred to [facility] 8/27 - in ED found to have unstable T10 fracture. Underwent emergent stabilization of fracture and decompression of spinal cord -T8 - T12 fusion with laminectomy and decompression of spinal cord with posterolateral arthrodesis.   Clinical Impression   PTA, pt lived alone and was independent with all ADL and mobility. Pt currently +2 total A with transfers and total A with ADL. Will benefit from rehab at [facility]. Pt will benefit from skilled OT services to facilitate D/C to next venue due to below deficits.    Follow Up Recommendations  CIR;Supervision/Assistance - 24 hour    Equipment Recommendations  3 in 1 bedside comode;Tub/shower bench;Wheelchair (measurements OT);Wheelchair cushion (measurements OT);Hospital bed    Recommendations for Other Services Rehab consult     Precautions / Restrictions Precautions Precautions: Back Required Braces or Orthoses: Spinal Brace Spinal Brace: Thoracolumbosacral orthotic;Applied in supine position Restrictions Weight Bearing Restrictions: No      Mobility Bed Mobility Overal bed mobility: Needs Assistance Bed Mobility: Rolling Rolling: Max assist;+2 for physical assistance         General bed mobility comments: patient attempting to assist with bilateral UEs  Transfers Overall transfer level: Needs assistance               General transfer comment: Required use of lift to chair; will need slide board    Balance Overall balance assessment: Needs assistance Sitting-balance support: Feet supported;Bilateral upper extremity supported Sitting balance-Leahy Scale: Poor                                      ADL Overall ADL's : Needs  assistance/impaired Eating/Feeding: Set up;Sitting   Grooming: Set up;Sitting   Upper Body Bathing: Minimal assitance;Sitting   Lower Body Bathing: Total assistance;Bed level   Upper Body Dressing : Moderate assistance;Sitting Upper Body Dressing Details (indicate cue type and reason): total A to donn/doff TLSO in supine Lower Body Dressing: Total assistance;Bed level   Toilet Transfer: +2 for safety/equipment;Total assistance (sky lift used)   Toileting- Clothing Manipulation and Hygiene: Total assistance Toileting - Clothing Manipulation Details (indicate cue type and reason): foley     Functional mobility during ADLs: +2 for physical assistance;Total assistance       Vision    wears glasses for reading                 Perception     Praxis      Pertinent Vitals/Pain Pain Assessment: Faces Faces Pain Scale: Hurts even more Pain Location: back Pain Descriptors / Indicators: Aching Pain Intervention(s): Limited activity within patient's tolerance;Monitored during session;RN gave pain meds during session     Hand Dominance Left   Extremity/Trunk Assessment Upper Extremity Assessment Upper Extremity Assessment: Overall WFL for tasks assessed   Lower Extremity Assessment Lower Extremity Assessment: Defer to PT evaluation BLE numbness. No movement observed BLE. Increased spasticity  BLE, R>L   Cervical / Trunk Assessment Cervical / Trunk Assessment: Other exceptions (kyphotic. s/p back surgery)   Communication Communication Communication: HOH   Cognition Arousal/Alertness: Awake/alert Behavior During Therapy: WFL for tasks assessed/performed Overall Cognitive Status: Within Functional Limits for tasks assessed  General Comments       Exercises Exercises: Other exercises Other Exercises Other Exercises: encouraged use of incentive spirometer   Shoulder Instructions      Home Living Family/patient expects to be  discharged to:: Inpatient rehab                                        Prior Functioning/Environment Level of Independence: Independent             OT Diagnosis: Generalized weakness;Paresis;Acute pain   OT Problem List: Decreased strength;Decreased range of motion;Decreased activity tolerance;Impaired balance (sitting and/or standing);Decreased knowledge of use of DME or AE;Decreased knowledge of precautions;Impaired sensation;Impaired tone;Obesity;Pain   OT Treatment/Interventions: Self-care/ADL training;Therapeutic exercise;Energy conservation;DME and/or AE instruction;Therapeutic activities;Patient/family education;Balance training    OT Goals(Current goals can be found in the care plan section) Acute Rehab OT Goals Patient Stated Goal: to be independent again  OT Goal Formulation: With patient Time For Goal Achievement: 03/07/14 Potential to Achieve Goals: Good  OT Frequency: Min 2X/week   Barriers to D/C:            Co-evaluation PT/OT/SLP Co-Evaluation/Treatment: Yes Reason for Co-Treatment: Complexity of the patient's impairments (multi-system involvement);For patient/therapist safety PT goals addressed during session: Mobility/safety with mobility OT goals addressed during session: ADL's and self-care      End of Session Equipment Utilized During Treatment: Back brace;Oxygen Nurse Communication: Mobility status;Other (comment) (need for TEDs in future when OOB)  Activity Tolerance: Patient tolerated treatment well Patient left: in chair;with call bell/phone within reach;Other (comment) (geomat in chair)   Time: [phone] OT Time Calculation (min): 38 min Charges:  OT General Charges $OT Visit: 1 Procedure OT Evaluation $Initial OT Evaluation Tier I: 1 Procedure OT Treatments $Self Care/Home Management : 8-22 mins G-Codes:    Reighlynn Swiney,HILLARY 03/21/14, 10:23 AM   Maurie Boettcher, OTR/L  [phone] 2014-03-21

## 2014-02-21 NOTE — Progress Notes (Signed)
PULMONARY / CRITICAL CARE MEDICINE   Name: Yolanda Solis MRN: [id number] DOB: [birth year]    ADMISSION DATE:  02/17/2014 CONSULTATION DATE:  02/18/2014  REFERRING MD :  Dr. Vertell Limber  CHIEF COMPLAINT:  BLE weakness  INITIAL PRESENTATION: 80 year old female presented to [facility] 8/27 s/p fall on 8/26. She was complaining of weakness to bilateral lower extremities. In ED found to have unstable T10 fracture. She was transferred to [facility] for emergent stabilization of fracture and decompression of spinal cord. To ICU post-operatively.   STUDIES:  8/27 MRI T-spine > Displaced chance fracture through the T10 vertebral body. There is spinal stenosis and mild cord compression and mild cord edema. This fracture is unstable.  SIGNIFICANT EVENTS: 8/26 fall, to [facility]. Discharged after Xrays negative 8/27 Returned to ED, Fx found. Transfer to [facility] for emergent surgery.  8/31: sensory .motor remains absent lowers  SUBJECTIVE: In chair  VITAL SIGNS: Temp:  [97.2 F (36.2 C)-98 F (36.7 C)] 97.7 F (36.5 C) (08/31 0800) Pulse Rate:  [54-88] 54 (08/31 0800) Resp:  [10-20] 13 (08/31 0800) BP: (98-146)/(38-129) 109/51 mmHg (08/31 0800) SpO2:  [93 %-99 %] 96 % (08/31 0800) HEMODYNAMICS:   VENTILATOR SETTINGS:   INTAKE / OUTPUT:  Intake/Output Summary (Last 24 hours) at 02/21/14 3220 Last data filed at 02/21/14 0600  Gross per 24 hour  Intake    240 ml  Output   1215 ml  Net   -975 ml    PHYSICAL EXAMINATION: General:  Elderly female in no acute distress inchair Neuro:  Spontaneously awake, alert, orientedx4 HEENT:  [place]/AT, PERRL, no JVD noted Cardiovascular:  s1 s2 Irreg Irreg, rate controlled.  Lungs:  Clear anteriorly  Abdomen:  Soft, non-tender, non-distended Musculoskeletal:  No acute deformity. No movement of BLE Skin:  Intact, no sensation to BLE  LABS:  CBC  Recent Labs Lab 02/19/14 0230 02/20/14 0225 02/21/14 0231  WBC 18.3* 12.6* 12.2*  HGB 10.6* 10.7* 11.5*  HCT 32.7*  33.8* 35.7*  PLT 200 175 206   Coag's No results found for this basename: APTT, INR,  in the last 168 hours  BMET  Recent Labs Lab 02/19/14 0230 02/20/14 0225 02/21/14 0231  NA 138 139 138  K 4.1 3.9 3.9  CL 103 100 100  CO2 23 27 27   BUN 25* 27* 22  CREATININE 0.95 1.11* 0.83  GLUCOSE 153* 134* 148*   Electrolytes  Recent Labs Lab 02/19/14 0230 02/20/14 0225 02/21/14 0231  CALCIUM 8.3* 8.3* 8.4  MG 1.7 2.2 1.8  PHOS 2.1* 3.5 3.4   Sepsis Markers  Recent Labs Lab 02/18/14 0620 02/19/14 0230 02/20/14 0225  PROCALCITON 0.51 0.66 0.47   ABG No results found for this basename: PHART, PCO2ART, PO2ART,  in the last 168 hours Liver Enzymes No results found for this basename: AST, ALT, ALKPHOS, BILITOT, ALBUMIN,  in the last 168 hours Cardiac Enzymes No results found for this basename: TROPONINI, PROBNP,  in the last 168 hours Glucose  Recent Labs Lab 02/20/14 0755 02/20/14 1217 02/20/14 1634 02/20/14 1949 02/20/14 2350 02/21/14 0421  GLUCAP 164* 180* 145* 143* 155* 162*    Imaging No results found. ASSESSMENT / PLAN:  PULMONARY A: 1L [place], sats 96%  P:   IS per RT protocol Titrate O2 for sat of 88-92% O2 remains, assess pcxr for atx if not able to dc  CARDIOVASCULAR A:  Atrial Fibrillation - appears to be new onset, had prior to surgery at [facility]. Currently rate controlled.  H/o HTN  P:  Continuous cardiac monitoring Start SQ heparin (5,000units q8) but no full anti-coag for a-fib until 5-7 days out from surgery and further d/w NS Continue preadmission antihypertensives - and not restart ACEi at all, given crt rise Continue dilt PO to 60 mg PO q6, tolerated well Consider MAP goals 70 Obtain tsh, echo assessment for clot  RENAL A:   Hypo Mag, resolved  P:   Follow Bmet  and ensure normal, k , mag with fib KVO IVF.  GASTROINTESTINAL A:   Last BM 8/26. Active BS.   P:   SUP: IV protonix Carb modified diet.  Start bowel regimen,  ducolax suppository now. Miralax in PM if no BM this afternoon.   HEMATOLOGIC A:   DV tprevention  P:  Follow CBC SQ heparin, no full anticoag until 5-7 days post surgery. Then consider hep IV SCD's  INFECTIOUS A:   SIRS, no obvious source Leukocytosis with afebrile UC neg on 8/27 P:   Trend ever and WBC curve    ENDOCRINE A:  DM  P:   CBG monitoring. Restart preadmission metformin upon dc ssi Diabetic coordinator to assist with management, currently on insulin aspart with meals.   NEUROLOGIC A:   S/p surgical repair of T10 fracture with spinal cord compression Paraplegia BLE Acute encephalopathy seeminly resolved with no confusion this AM.   P:   Per neurosurgery Pain management per neurosurgery Monitor clinically PT/OT and OOB to chair. Continue valium to home dose.  Consider  Transfer out of icu, per NS, consider to tele  I have personally obtained a history, examined the patient, evaluated laboratory and imaging results, formulated the assessment and plan and placed orders.   Lavon Paganini. Titus Mould, MD, [place] Pgr: [place] [facility]

## 2014-02-21 NOTE — Progress Notes (Signed)
Physical Therapy Treatment Patient Details Name: Yolanda Solis MRN: [id number] DOB: 02-Jul-1933 Today's Date: 02/21/2014    History of Present Illness 80 year old female presented to [facility] s/p fall on 8/26. She was complaining of weakness to bilateral lower extremities. In ED found to have unstable T10 fracture. She was transferred to [facility] for emergent stabilization of fracture and decompression of spinal cord. Patient s/p Thoracolumbar Repair T8 - T12 fusion with laminectomy and decompression of spinal cord with posterolateral arthrodesis.    PT Comments    Patient OOB to chair via lift equipment today. Compression wraps applied on bilateral LEs.  TLSO donned in supine (OF NOTE: brace appears to be riding up on patient secondary to increased abdominal distention compared to previous session; nsg notified). Pt continues to demonstrate increased tone in RLE, recommend PRAFO boots.  Geo mat and Ted hose in place.   Follow Up Recommendations  CIR     Equipment Recommendations  Wheelchair (measurements PT);Wheelchair cushion (measurements PT);Hospital bed (TBD)    Recommendations for Other Services Rehab consult     Precautions / Restrictions Precautions Precautions: Back Required Braces or Orthoses: Spinal Brace Spinal Brace: Thoracolumbosacral orthotic;Applied in supine position Restrictions Weight Bearing Restrictions: No    Mobility  Bed Mobility Overal bed mobility: Needs Assistance Bed Mobility: Rolling Rolling: Max assist;+2 for physical assistance         General bed mobility comments: patient attempting to assist with bilateral UEs  Transfers Overall transfer level: Needs assistance               General transfer comment: maxi sky lift to chair  Ambulation/Gait                 Stairs            Wheelchair Mobility    Modified Rankin (Stroke Patients Only)       Balance                                     Cognition Arousal/Alertness: Awake/alert Behavior During Therapy: WFL for tasks assessed/performed Overall Cognitive Status: Within Functional Limits for tasks assessed                      Exercises      General Comments General comments (skin integrity, edema, etc.): Compression wraps and spinal brace applied in supine      Pertinent Vitals/Pain Pain Assessment: Faces Faces Pain Scale: Hurts even more Pain Location: back Pain Descriptors / Indicators: Aching Pain Intervention(s): Limited activity within patient's tolerance;Monitored during session;RN gave pain meds during session    Home Living Family/patient expects to be discharged to:: Inpatient rehab                    Prior Function Level of Independence: Independent          PT Goals (current goals can now be found in the care plan section) Acute Rehab PT Goals PT Goal Formulation: With patient Time For Goal Achievement: 03/04/14 Potential to Achieve Goals: Fair Progress towards PT goals: Progressing toward goals    Frequency  Min 3X/week    PT Plan Current plan remains appropriate    Co-evaluation PT/OT/SLP Co-Evaluation/Treatment: Yes Reason for Co-Treatment: Complexity of the patient's impairments (multi-system involvement);For patient/therapist safety PT goals addressed during session: Mobility/safety with mobility OT goals addressed during session: ADL's and self-care  End of Session Equipment Utilized During Treatment: Back brace Activity Tolerance: Patient limited by fatigue;Patient limited by pain Patient left: in chair;with call bell/phone within reach;with family/visitor present     Time: 0920-1002 PT Time Calculation (min): 42 min  Charges:  $Therapeutic Activity: 23-37 mins                    G CodesDuncan Dull 11-Mar-2014, 10:17 AM Alben Deeds, PT DPT  [phone]

## 2014-02-21 NOTE — Progress Notes (Signed)
Physical Therapy Treatment Patient Details Name: IVADELL GAUL MRN: [id number] DOB: Sep 02, 1933 Today's Date: 02/21/2014    History of Present Illness 80 year old female presented to [facility] 8/27 s/p fall on 8/26. She was complaining of weakness to bilateral lower extremities. In ED found to have unstable T10 fracture. She was transferred to [facility] for emergent stabilization of fracture and decompression of spinal cord. Patient s/p Thoracolumbar Repair T8 - T12 fusion with laminectomy and decompression of spinal cord with posterolateral arthrodesis.    PT Comments    Patient assisted back to bed, ROM performed and PRAFOs applied.  Compression wraps and back brace removed. Patient tolerated well, but continues to have pain with mobility.   Follow Up Recommendations  CIR     Equipment Recommendations  Wheelchair (measurements PT);Wheelchair cushion (measurements PT);Hospital bed (TBD)    Recommendations for Other Services Rehab consult     Precautions / Restrictions Precautions Precautions: Back Required Braces or Orthoses: Spinal Brace Spinal Brace: Thoracolumbosacral orthotic;Applied in supine position (PRAFO)    Mobility  Bed Mobility Overal bed mobility: Needs Assistance Bed Mobility: Rolling Rolling: Max assist;+2 for physical assistance         General bed mobility comments: assisted with rolling to take off brace once returned to bed  Transfers Overall transfer level: Needs assistance               General transfer comment: maxi sky back to bed, PRAFO boots applied. Compression wraps removed, SCDs donned  Ambulation/Gait                 Stairs            Wheelchair Mobility    Modified Rankin (Stroke Patients Only)       Balance     Sitting balance-Leahy Scale: Poor                              Cognition Arousal/Alertness: Awake/alert Behavior During Therapy: WFL for tasks assessed/performed Overall Cognitive Status:  Within Functional Limits for tasks assessed                      Exercises      General Comments        Pertinent Vitals/Pain Pain Assessment: Faces Faces Pain Scale: Hurts even more Pain Location: back Pain Descriptors / Indicators: Aching    Home Living                      Prior Function            PT Goals (current goals can now be found in the care plan section) Acute Rehab PT Goals PT Goal Formulation: With patient Time For Goal Achievement: 03/04/14 Potential to Achieve Goals: Fair Progress towards PT goals: Progressing toward goals    Frequency  Min 3X/week    PT Plan Current plan remains appropriate    Co-evaluation             End of Session Equipment Utilized During Treatment: Back brace Activity Tolerance: Patient limited by fatigue;Patient limited by pain Patient left: in bed;with call bell/phone within reach;with family/visitor present     Time: 3976-7341 PT Time Calculation (min): 21 min  Charges:  $Therapeutic Activity: 8-22 mins                    G Codes:      Alben Deeds  J 02/21/2014, 3:55 PM Alben Deeds, [place] DPT  [phone]

## 2014-02-22 DIAGNOSIS — I517 Cardiomegaly: Secondary | ICD-10-CM

## 2014-02-22 LAB — BASIC METABOLIC PANEL
Anion gap: 14 (ref 5–15)
BUN: 28 mg/dL — ABNORMAL HIGH (ref 6–23)
CHLORIDE: 98 meq/L (ref 96–112)
CO2: 24 meq/L (ref 19–32)
Calcium: 8.6 mg/dL (ref 8.4–10.5)
Creatinine, Ser: 0.95 mg/dL (ref 0.50–1.10)
GFR calc Af Amer: 65 mL/min — ABNORMAL LOW (ref >90)
GFR calc non Af Amer: 56 mL/min — ABNORMAL LOW (ref >90)
Glucose, Bld: 180 mg/dL — ABNORMAL HIGH (ref 70–99)
Potassium: 4.2 mEq/L (ref 3.7–5.3)
Sodium: 136 mEq/L — ABNORMAL LOW (ref 137–147)

## 2014-02-22 LAB — CBC
HEMATOCRIT: 37.3 % (ref 36.0–46.0)
HEMOGLOBIN: 11.8 g/dL — AB (ref 12.0–15.0)
MCH: 29.6 pg (ref 26.0–34.0)
MCHC: 31.6 g/dL (ref 30.0–36.0)
MCV: 93.5 fL (ref 78.0–100.0)
Platelets: 233 10*3/uL (ref 150–400)
RBC: 3.99 MIL/uL (ref 3.87–5.11)
RDW: 13.4 % (ref 11.5–15.5)
WBC: 14.1 10*3/uL — ABNORMAL HIGH (ref 4.0–10.5)

## 2014-02-22 LAB — GLUCOSE, CAPILLARY
GLUCOSE-CAPILLARY: 165 mg/dL — AB (ref 70–99)
GLUCOSE-CAPILLARY: 213 mg/dL — AB (ref 70–99)
GLUCOSE-CAPILLARY: 229 mg/dL — AB (ref 70–99)
Glucose-Capillary: 185 mg/dL — ABNORMAL HIGH (ref 70–99)
Glucose-Capillary: 186 mg/dL — ABNORMAL HIGH (ref 70–99)
Glucose-Capillary: 207 mg/dL — ABNORMAL HIGH (ref 70–99)
Glucose-Capillary: 202 mg/dL — ABNORMAL HIGH (ref 70–99)

## 2014-02-22 LAB — TSH: TSH: 1.73 u[IU]/mL (ref 0.350–4.500)

## 2014-02-22 LAB — MAGNESIUM: Magnesium: 2 mg/dL (ref 1.5–2.5)

## 2014-02-22 LAB — PHOSPHORUS: Phosphorus: 3.5 mg/dL (ref 2.3–4.6)

## 2014-02-22 NOTE — Progress Notes (Signed)
*  PRELIMINARY RESULTS* Echocardiogram 2D Echocardiogram has been performed.  Leavy Cella 02/22/2014, 12:17 PM

## 2014-02-22 NOTE — Progress Notes (Signed)
UR COMPLETED  

## 2014-02-22 NOTE — Progress Notes (Signed)
Inpatient Diabetes Program Recommendations  AACE/ADA: New Consensus Statement on Inpatient Glycemic Control (2013)  Target Ranges:  Prepandial:   less than 140 mg/dL      Peak postprandial:   less than 180 mg/dL (1-2 hours)      Critically ill patients:  140 - 180 mg/dL   Reason for Assessment:  Results for CAMILLA, SKEEN (MRN [id number]) as of 02/22/2014 11:31  Ref. Range 02/21/2014 19:30 02/22/2014 00:04 02/22/2014 04:22 02/22/2014 08:57 02/22/2014 09:01  Glucose-Capillary Latest Range: 70-99 mg/dL 215 (H) 213 (H) 185 (H) 186 (H) 165 (H)  Results for JOSEE, SPEECE (MRN [id number]) as of 02/22/2014 11:31  Ref. Range 02/19/2014 02:30  Hemoglobin A1C Latest Range: <5.7 % 7.9 (H)   Diabetes history: Type 2 diabetes Outpatient Diabetes medications: Metformin 500 mg bid Current orders for Inpatient glycemic control: Novolog correction 2-4-6 q 4 hours (ICU Glycemic control protocol)  MD, please consider changing Novolog correction to moderate tid with meals and HS.  Also while in the hospital, please add Lantus 15 units daily as basal insulin while Metformin is on hold.  Based on A1C patient will likely need changes in home diabetes regimen to improve glycemic control.   Thanks, Adah Perl, RN, BC-ADM Inpatient Diabetes Coordinator Pager [phone]

## 2014-02-22 NOTE — Progress Notes (Signed)
I spoke with pt's RN concerning pt's last BM 8/26. 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 I requested RN to provide pt with suppository tonight. I have contacted pt's son to finalize disposition and caregiver support. I will follow up tomorrow with possible admission to inpt rehab. [phone]

## 2014-02-22 NOTE — Progress Notes (Signed)
Subjective: Patient reports "Every now and then I get a sensation in my stomach when they move my foot... but I don't hurt"  Objective: Vital signs in last 24 hours: Temp:  [97.8 F (36.6 C)-98.5 F (36.9 C)] 98.4 F (36.9 C) (09/01 1010) Pulse Rate:  [81-92] 92 (09/01 1010) Resp:  [14-18] 18 (09/01 1010) BP: (118-130)/(53-62) 119/54 mmHg (09/01 1010) SpO2:  [95 %-100 %] 95 % (09/01 1010)  Intake/Output from previous day: 08/31 0701 - 09/01 0700 In: 323 [P.O.:320; I.V.:3] Out: 75 [Urine:75] Intake/Output this shift: Total I/O In: 240 [P.O.:240] Out: -   Alert, conversant, without complaint of pain or discomfort. Exam unchanged, no sensation or strength BLE. Pt notes her son is in discussion with Rehab re: possible CIR vs SNF.  Lab Results:  Recent Labs  02/21/14 0231 02/22/14 0620  WBC 12.2* 14.1*  HGB 11.5* 11.8*  HCT 35.7* 37.3  PLT 206 233   BMET  Recent Labs  02/21/14 0231 02/22/14 0620  NA 138 136*  K 3.9 4.2  CL 100 98  CO2 27 24  GLUCOSE 148* 180*  BUN 22 28*  CREATININE 0.83 0.95  CALCIUM 8.4 8.6    Studies/Results: No results found.  Assessment/Plan:   LOS: 5 days  Hopeful of CIR placement.    Verdis Prime 02/22/2014, 1:41 PM

## 2014-02-22 NOTE — Progress Notes (Signed)
PULMONARY / CRITICAL CARE MEDICINE   Name: Yolanda Solis MRN: [id number] DOB: March 16, 1934    ADMISSION DATE:  02/17/2014 CONSULTATION DATE:  02/18/2014  REFERRING MD :  Dr. Vertell Limber  CHIEF COMPLAINT:  BLE weakness  INITIAL PRESENTATION: 80 year old female presented to [facility] s/p fall on 8/26. She was complaining of weakness to bilateral lower extremities. In ED found to have unstable T10 fracture. She was transferred to [facility] for emergent stabilization of fracture and decompression of spinal cord. To ICU post-operatively.   STUDIES:  8/27 MRI T-spine > Displaced chance fracture through the T10 vertebral body. There is spinal stenosis and mild cord compression and mild cord edema. This fracture is unstable.  SIGNIFICANT EVENTS: 8/26 fall, to [facility]. Discharged after Xrays negative 8/27 Returned to ED, Fx found. Transfer to [facility] for emergent surgery.  8/31: sensory .motor remains absent lowers  SUBJECTIVE: 02/22/14: eating lunch. [place] sensation in stomach  VITAL SIGNS: Temp:  [97.8 F (36.6 C)-98.5 F (36.9 C)] 98.4 F (36.9 C) (09/01 1010) Pulse Rate:  [81-92] 92 (09/01 1010) Resp:  [14-18] 18 (09/01 1010) BP: (118-130)/(53-62) 119/54 mmHg (09/01 1010) SpO2:  [95 %-100 %] 95 % (09/01 1010) HEMODYNAMICS:   VENTILATOR SETTINGS:   INTAKE / OUTPUT:  Intake/Output Summary (Last 24 hours) at 02/22/14 1422 Last data filed at 02/22/14 1236  Gross per 24 hour  Intake    360 ml  Output     75 ml  Net    285 ml    PHYSICAL EXAMINATION: General:  Elderly female in no acute distress inchair Neuro:  Spontaneously awake, alert, orientedx4. LE weak and not strength HEENT:  [place]/AT, PERRL, no JVD noted Cardiovascular:  s1 s2 Irreg Irreg, rate controlled.  Lungs:  Clear anteriorly  Abdomen:  Soft, non-tender, non-distended Musculoskeletal:  No acute deformity. No movement of BLE Skin:  Intact, no sensation to BLE  LABS:  CBC  Recent Labs Lab 02/20/14 0225 02/21/14 0231  02/22/14 0620  WBC 12.6* 12.2* 14.1*  HGB 10.7* 11.5* 11.8*  HCT 33.8* 35.7* 37.3  PLT 175 206 233   Coag's No results found for this basename: APTT, INR,  in the last 168 hours  BMET  Recent Labs Lab 02/20/14 0225 02/21/14 0231 02/22/14 0620  NA 139 138 136*  K 3.9 3.9 4.2  CL 100 100 98  CO2 27 27 24   BUN 27* 22 28*  CREATININE 1.11* 0.83 0.95  GLUCOSE 134* 148* 180*   Electrolytes  Recent Labs Lab 02/20/14 0225 02/21/14 0231 02/22/14 0620  CALCIUM 8.3* 8.4 8.6  MG 2.2 1.8 2.0  PHOS 3.5 3.4 3.5   Sepsis Markers  Recent Labs Lab 02/18/14 0620 02/19/14 0230 02/20/14 0225  PROCALCITON 0.51 0.66 0.47   ABG No results found for this basename: PHART, PCO2ART, PO2ART,  in the last 168 hours Liver Enzymes No results found for this basename: AST, ALT, ALKPHOS, BILITOT, ALBUMIN,  in the last 168 hours Cardiac Enzymes No results found for this basename: TROPONINI, PROBNP,  in the last 168 hours Glucose  Recent Labs Lab 02/21/14 1930 02/22/14 0004 02/22/14 0422 02/22/14 0857 02/22/14 0901 02/22/14 1222  GLUCAP 215* 213* 185* 186* 165* 207*    Imaging No results found. ASSESSMENT / PLAN:  PULMONARY A: 1L [place], sats 96%  P:   IS per RT protocol Titrate O2 for sat of 88-92% O2 remains, assess pcxr for atx if not able to dc  CARDIOVASCULAR A:  Atrial Fibrillation - appears to  be new onset, had prior to surgery at [facility]. Currently rate controlled.  H/o HTN  P:  Continuous cardiac monitoring Start SQ heparin (5,000units q8) but no full anti-coag for a-fib until 5-7 days out from surgery and further d/w NS Continue preadmission antihypertensives - and not restart ACEi at all, given crt rise Continue dilt PO to 60 mg PO q6, tolerated well Consider MAP goals 70 Obtain tsh, echo assessment for clot  RENAL A:   Hypo Mag, resolved  P:   Follow Bmet  and ensure normal, k , mag with fib KVO IVF.  GASTROINTESTINAL A:   Last BM 8/26. Active BS.    P:   SUP: IV protonix Carb modified diet.  Start bowel regimen, ducolax suppository now. Miralax in PM if no BM this afternoon.   HEMATOLOGIC A:   DVT prevention  P:  Follow CBC SQ heparin, no full anticoag until 5-7 days post surgery. Then consider hep IV SCD's  INFECTIOUS A:   SIRS, no obvious source Leukocytosis with afebrile UC neg on 8/27 P:   Trend ever and WBC curve    ENDOCRINE A:  DM  P:   CBG monitoring. Restart preadmission metformin upon dc ssi Diabetic coordinator to assist with management, currently on insulin aspart with meals.   NEUROLOGIC A:   S/p surgical repair of T10 fracture with spinal cord compression Paraplegia BLE Acute encephalopathy seeminly resolved with no confusion this AM.   P:   Per neurosurgery Pain management per neurosurgery Monitor clinically PT/OT and OOB to chair. Continue valium to home dose.   PCcM will sign off. Very little to add. Consider [facility] medical consult if needed  Dr. Brand Males, M.D., [facility].C.P Pulmonary and Critical Care Medicine Staff Physician [place] [facility] Pager: [phone], If no answer or between  15:00h - 7:00h: call [phone]  02/22/2014 2:26 PM

## 2014-02-23 ENCOUNTER — Inpatient Hospital Stay (HOSPITAL_COMMUNITY)
Admission: RE | Admit: 2014-02-23 | Discharge: 2014-03-14 | DRG: 945 | Disposition: A | Discharge status: Skilled Nursing Facility | Payer: Medicare Other | Source: Intra-hospital | Attending: Physical Medicine & Rehabilitation | Admitting: Physical Medicine & Rehabilitation

## 2014-02-23 DIAGNOSIS — Y92009 Unspecified place in unspecified non-institutional (private) residence as the place of occurrence of the external cause: Secondary | ICD-10-CM

## 2014-02-23 DIAGNOSIS — W19XXXA Unspecified fall, initial encounter: Secondary | ICD-10-CM

## 2014-02-23 DIAGNOSIS — E1142 Type 2 diabetes mellitus with diabetic polyneuropathy: Secondary | ICD-10-CM | POA: Diagnosis not present

## 2014-02-23 DIAGNOSIS — IMO0002 Reserved for concepts with insufficient information to code with codable children: Secondary | ICD-10-CM | POA: Diagnosis not present

## 2014-02-23 DIAGNOSIS — N390 Urinary tract infection, site not specified: Secondary | ICD-10-CM

## 2014-02-23 DIAGNOSIS — E1165 Type 2 diabetes mellitus with hyperglycemia: Secondary | ICD-10-CM

## 2014-02-23 DIAGNOSIS — Y831 Surgical operation with implant of artificial internal device as the cause of abnormal reaction of the patient, or of later complication, without mention of misadventure at the time of the procedure: Secondary | ICD-10-CM

## 2014-02-23 DIAGNOSIS — A498 Other bacterial infections of unspecified site: Secondary | ICD-10-CM

## 2014-02-23 DIAGNOSIS — E1149 Type 2 diabetes mellitus with other diabetic neurological complication: Secondary | ICD-10-CM | POA: Diagnosis not present

## 2014-02-23 DIAGNOSIS — N319 Neuromuscular dysfunction of bladder, unspecified: Secondary | ICD-10-CM | POA: Diagnosis not present

## 2014-02-23 DIAGNOSIS — K929 Disease of digestive system, unspecified: Secondary | ICD-10-CM | POA: Diagnosis not present

## 2014-02-23 DIAGNOSIS — I4891 Unspecified atrial fibrillation: Secondary | ICD-10-CM | POA: Diagnosis not present

## 2014-02-23 DIAGNOSIS — K560 Paralytic ileus: Secondary | ICD-10-CM

## 2014-02-23 DIAGNOSIS — Z79899 Other long term (current) drug therapy: Secondary | ICD-10-CM

## 2014-02-23 DIAGNOSIS — Z5189 Encounter for other specified aftercare: Secondary | ICD-10-CM | POA: Diagnosis present

## 2014-02-23 DIAGNOSIS — K592 Neurogenic bowel, not elsewhere classified: Secondary | ICD-10-CM | POA: Diagnosis not present

## 2014-02-23 DIAGNOSIS — S22009S Unspecified fracture of unspecified thoracic vertebra, sequela: Secondary | ICD-10-CM

## 2014-02-23 DIAGNOSIS — IMO0001 Reserved for inherently not codable concepts without codable children: Secondary | ICD-10-CM

## 2014-02-23 DIAGNOSIS — Z981 Arthrodesis status: Secondary | ICD-10-CM | POA: Diagnosis not present

## 2014-02-23 DIAGNOSIS — F411 Generalized anxiety disorder: Secondary | ICD-10-CM | POA: Diagnosis not present

## 2014-02-23 DIAGNOSIS — I10 Essential (primary) hypertension: Secondary | ICD-10-CM | POA: Diagnosis not present

## 2014-02-23 LAB — CBC
HCT: 36 % (ref 36.0–46.0)
HEMATOCRIT: 35.5 % — AB (ref 36.0–46.0)
HEMOGLOBIN: 11.6 g/dL — AB (ref 12.0–15.0)
HEMOGLOBIN: 11.5 g/dL — AB (ref 12.0–15.0)
MCH: 30.1 pg (ref 26.0–34.0)
MCH: 29.6 pg (ref 26.0–34.0)
MCHC: 32.2 g/dL (ref 30.0–36.0)
MCHC: 32.4 g/dL (ref 30.0–36.0)
MCV: 93.3 fL (ref 78.0–100.0)
MCV: 91.3 fL (ref 78.0–100.0)
Platelets: 232 10*3/uL (ref 150–400)
Platelets: 234 10*3/uL (ref 150–400)
RBC: 3.86 MIL/uL — ABNORMAL LOW (ref 3.87–5.11)
RBC: 3.89 MIL/uL (ref 3.87–5.11)
RDW: 13.6 % (ref 11.5–15.5)
RDW: 13.5 % (ref 11.5–15.5)
WBC: 15.3 10*3/uL — ABNORMAL HIGH (ref 4.0–10.5)
WBC: 16.6 10*3/uL — ABNORMAL HIGH (ref 4.0–10.5)

## 2014-02-23 LAB — GLUCOSE, CAPILLARY
GLUCOSE-CAPILLARY: 174 mg/dL — AB (ref 70–99)
GLUCOSE-CAPILLARY: 201 mg/dL — AB (ref 70–99)
GLUCOSE-CAPILLARY: 202 mg/dL — AB (ref 70–99)
GLUCOSE-CAPILLARY: 216 mg/dL — AB (ref 70–99)
GLUCOSE-CAPILLARY: 233 mg/dL — AB (ref 70–99)
GLUCOSE-CAPILLARY: 245 mg/dL — AB (ref 70–99)
GLUCOSE-CAPILLARY: 247 mg/dL — AB (ref 70–99)

## 2014-02-23 LAB — CREATININE, SERUM
Creatinine, Ser: 1.13 mg/dL — ABNORMAL HIGH (ref 0.50–1.10)
GFR calc Af Amer: 53 mL/min — ABNORMAL LOW (ref >90)
GFR calc non Af Amer: 46 mL/min — ABNORMAL LOW (ref >90)

## 2014-02-23 MED ORDER — INSULIN ASPART 100 UNIT/ML ~~LOC~~ SOLN
2.0000 [IU] | SUBCUTANEOUS | Status: DC
  Administered 2014-02-23: 6 [IU] via SUBCUTANEOUS
  Administered 2014-02-24: 4 [IU] via SUBCUTANEOUS
  Administered 2014-02-24 (×2): 6 [IU] via SUBCUTANEOUS
  Administered 2014-02-24: 4 [IU] via SUBCUTANEOUS
  Administered 2014-02-24 – 2014-02-25 (×3): 6 [IU] via SUBCUTANEOUS

## 2014-02-23 MED ORDER — HYDROCODONE-ACETAMINOPHEN 5-325 MG PO TABS
1.0000 | ORAL_TABLET | ORAL | Status: DC | PRN
  Administered 2014-02-24 (×2): 2 via ORAL
  Administered 2014-02-24: 1 via ORAL
  Administered 2014-02-25 – 2014-03-05 (×21): 2 via ORAL
  Administered 2014-03-06: 1 via ORAL
  Administered 2014-03-06 – 2014-03-11 (×11): 2 via ORAL
  Administered 2014-03-12 – 2014-03-13 (×3): 1 via ORAL
  Administered 2014-03-14: 2 via ORAL
  Filled 2014-02-23: qty 2
  Filled 2014-02-23: qty 1
  Filled 2014-02-23 (×3): qty 2
  Filled 2014-02-23: qty 1
  Filled 2014-02-23 (×9): qty 2
  Filled 2014-02-23: qty 1
  Filled 2014-02-23 (×21): qty 2
  Filled 2014-02-23: qty 1
  Filled 2014-02-23 (×2): qty 2

## 2014-02-23 MED ORDER — SORBITOL 70 % SOLN
30.0000 mL | Freq: Every day | Status: DC | PRN
  Administered 2014-02-24: 30 mL via ORAL
  Filled 2014-02-23: qty 30

## 2014-02-23 MED ORDER — BISACODYL 10 MG RE SUPP: 10.0000 mg | Freq: Every day | RECTAL | Status: DC | PRN

## 2014-02-23 MED ORDER — DOCUSATE SODIUM 100 MG PO CAPS
100.0000 mg | ORAL_CAPSULE | Freq: Two times a day (BID) | ORAL | Status: DC
  Administered 2014-02-23 – 2014-02-24 (×2): 100 mg via ORAL
  Filled 2014-02-23 (×4): qty 1

## 2014-02-23 MED ORDER — HEPARIN SODIUM (PORCINE) 5000 UNIT/ML IJ SOLN
5000.0000 [IU] | Freq: Three times a day (TID) | INTRAMUSCULAR | Status: DC
  Administered 2014-02-23 – 2014-03-14 (×56): 5000 [IU] via SUBCUTANEOUS
  Filled 2014-02-23 (×62): qty 1

## 2014-02-23 MED ORDER — ONDANSETRON HCL 4 MG/2ML IJ SOLN: 4.0000 mg | Freq: Four times a day (QID) | INTRAMUSCULAR | Status: DC | PRN

## 2014-02-23 MED ORDER — ALPRAZOLAM 0.25 MG PO TABS
0.2500 mg | ORAL_TABLET | Freq: Three times a day (TID) | ORAL | Status: DC | PRN
  Administered 2014-02-23 – 2014-03-04 (×6): 0.25 mg via ORAL
  Filled 2014-02-23 (×7): qty 1

## 2014-02-23 MED ORDER — DILTIAZEM HCL 60 MG PO TABS
60.0000 mg | ORAL_TABLET | Freq: Four times a day (QID) | ORAL | Status: DC
  Administered 2014-02-24 – 2014-03-14 (×69): 60 mg via ORAL
  Filled 2014-02-23 (×78): qty 1

## 2014-02-23 MED ORDER — POLYETHYLENE GLYCOL 3350 17 G PO PACK
17.0000 g | PACK | Freq: Every day | ORAL | Status: DC | PRN
  Filled 2014-02-23: qty 1

## 2014-02-23 MED ORDER — METHOCARBAMOL 500 MG PO TABS
500.0000 mg | ORAL_TABLET | Freq: Four times a day (QID) | ORAL | Status: DC | PRN
  Administered 2014-02-23 – 2014-03-09 (×7): 500 mg via ORAL
  Filled 2014-02-23 (×7): qty 1

## 2014-02-23 MED ORDER — ONDANSETRON HCL 4 MG PO TABS
4.0000 mg | ORAL_TABLET | Freq: Four times a day (QID) | ORAL | Status: DC | PRN
Start: 2014-02-23 — End: 2014-03-14
  Administered 2014-03-02 – 2014-03-10 (×4): 4 mg via ORAL
  Filled 2014-02-23 (×5): qty 1

## 2014-02-23 MED ORDER — CETYLPYRIDINIUM CHLORIDE 0.05 % MT LIQD
7.0000 mL | Freq: Two times a day (BID) | OROMUCOSAL | Status: DC
  Administered 2014-02-23 – 2014-03-14 (×31): 7 mL via OROMUCOSAL

## 2014-02-23 MED ORDER — ALUM & MAG HYDROXIDE-SIMETH 200-200-20 MG/5ML PO SUSP: 30.0000 mL | Freq: Four times a day (QID) | ORAL | Status: DC | PRN

## 2014-02-23 MED ORDER — PANTOPRAZOLE SODIUM 40 MG PO TBEC
40.0000 mg | DELAYED_RELEASE_TABLET | Freq: Every day | ORAL | Status: DC
  Administered 2014-02-24 – 2014-03-14 (×18): 40 mg via ORAL
  Filled 2014-02-23 (×18): qty 1

## 2014-02-23 MED ORDER — HEPARIN SODIUM (PORCINE) 5000 UNIT/ML IJ SOLN: 5000.0000 [IU] | Freq: Three times a day (TID) | INTRAMUSCULAR | Status: DC

## 2014-02-23 MED ORDER — ACETAMINOPHEN 325 MG PO TABS
325.0000 mg | ORAL_TABLET | ORAL | Status: DC | PRN
  Administered 2014-02-24: 650 mg via ORAL
  Filled 2014-02-23: qty 2

## 2014-02-23 MED ORDER — LORATADINE 10 MG PO TABS
10.0000 mg | ORAL_TABLET | Freq: Every day | ORAL | Status: DC
  Administered 2014-02-24 – 2014-03-14 (×19): 10 mg via ORAL
  Filled 2014-02-23 (×20): qty 1

## 2014-02-23 NOTE — Discharge Summary (Signed)
Physician Discharge Summary  Patient ID: Yolanda Solis MRN: [id number] DOB/AGE: 08/15/1933 80 y.o.  Admit date: 02/17/2014 Discharge date: 02/23/2014  Admission Diagnoses:Thoracic Fracture T 10 fracture dislocation (Chance Fracture) with acute paraplegia   Discharge Diagnoses: Thoracic Fracture T 10 fracture dislocation (Chance Fracture) with acute paraplegia s/p Thoracolumbar Repair (N/A) - Thoracolumbar Repair T8 - T12 fusion with laminectomy and decompression of spinal cord with posterolateral arthrodesis  Active Problems:   Fracture of thoracic spine   Fracture dislocation of thoracic spine   HTN (hypertension)   Type II or unspecified type diabetes mellitus without mention of complication, uncontrolled   Discharged Condition: stable  Hospital Course: Yolanda Solis was admitted through the ED after loss of BLE sensation and motor function after a fall. Transferred to [facility] from [facility].  MRI T spine revealed displaced T 10 Chance fracture.  Patient with complete paraplegia.  Following emergent decompressive surgery, uncomplicated, pt did not demonstrate improved function; and supportive care was initiated.  Transfer to CIR. Plan to resume full anticoagulation seven days post-op.        Consults: rehabilitation medicine  Significant Diagnostic Studies: radiology: X-Ray: intra-operative  Treatments: surgery: Thoracolumbar Repair (N/A) - Thoracolumbar Repair T8 - T12 fusion with laminectomy and decompression of spinal cord with posterolateral arthrodesis   Discharge Exam: Blood pressure 151/69, pulse 85, temperature 98.1 F (36.7 C), temperature source Oral, resp. rate 18, height 5\' 4"  (1.626 m), weight 94.7 kg (208 lb 12.4 oz), SpO2 97.00%. Complete paraplegia persists. Plan to resume full anticoagulation seven days post-op  Disposition: Discharge to [place].     Medication List    ASK your doctor about these medications       ALLERGY RELIEF 10 MG tablet   Generic drug:  loratadine  Take 10 mg by mouth daily.     diazepam 5 MG tablet  Commonly known as:  VALIUM  Take 0.5 tablets (2.5 mg total) by mouth every 8 (eight) hours as needed for anxiety.     docusate sodium 100 MG capsule  Commonly known as:  COLACE  Take 1 capsule (100 mg total) by mouth every 12 (twelve) hours.     ibuprofen 200 MG tablet  Commonly known as:  ADVIL,MOTRIN  Take 400 mg by mouth at bedtime.     lisinopril 5 MG tablet  Commonly known as:  PRINIVIL,ZESTRIL  Take 5 mg by mouth daily.     metFORMIN 500 MG tablet  Commonly known as:  GLUCOPHAGE  Take 500 mg by mouth 2 (two) times daily with a meal.     oxyCODONE-acetaminophen 5-325 MG per tablet  Commonly known as:  PERCOCET/ROXICET  Take 1-2 tablets by mouth every 4 (four) hours as needed for severe pain.         Signed: Verdis Prime 02/23/2014, 5:56 PM

## 2014-02-23 NOTE — Progress Notes (Signed)
Physical Therapy Treatment Patient Details Name: Yolanda Solis MRN: [id number] DOB: Jul 30, 1933 Today's Date: 02/23/2014    History of Present Illness 80 year old female presented to [facility] 8/27 s/p fall on 8/26. She was complaining of weakness to bilateral lower extremities. In ED found to have unstable T10 fracture. She was transferred to [facility] for emergent stabilization of fracture and decompression of spinal cord. Patient s/p Thoracolumbar Repair T8 - T12 fusion with laminectomy and decompression of spinal cord with posterolateral arthrodesis.    PT Comments    Pt with improved sitting EOB tolerance and ability to maintain balance this date. Con't to require max/total assist for all mobility/transfers. Con't to recommend CIR upon d/c.  Follow Up Recommendations  CIR     Equipment Recommendations  Wheelchair (measurements PT);Wheelchair cushion (measurements PT);Hospital bed    Recommendations for Other Services Rehab consult     Precautions / Restrictions Precautions Precautions: Back Required Braces or Orthoses: Spinal Brace Spinal Brace: Thoracolumbosacral orthotic;Applied in supine position Restrictions Weight Bearing Restrictions: No    Mobility  Bed Mobility Overal bed mobility: Needs Assistance Bed Mobility: Rolling Rolling: Max assist;+2 for physical assistance Sidelying to sit: Total assist;+2 for physical assistance     Sit to sidelying: Total assist;+2 for physical assistance General bed mobility comments: maximal directional v/c's, pt attempted to assist with UEs  Transfers                    Ambulation/Gait                 Stairs            Wheelchair Mobility    Modified Rankin (Stroke Patients Only)       Balance Overall balance assessment: Needs assistance Sitting-balance support: Feet supported;Bilateral upper extremity supported Sitting balance-Leahy Scale: Poor Sitting balance - Comments: pt did tolerate sitting EOB x  15 min. pt remains to have posterior lean however pt able to maintain sitting balance with min guard x 30 sec while holding onto PTs forearm. Pt overall with intermittent support of min to max to maintain EOB balance. Pt BP at 134/74 without ace wraps but did educated RN on benefit of compression ted hose                            Cognition Arousal/Alertness: Awake/alert Behavior During Therapy: WFL for tasks assessed/performed Overall Cognitive Status: Within Functional Limits for tasks assessed                      Exercises      General Comments General comments (skin integrity, edema, etc.): RN to order ted hose      Pertinent Vitals/Pain Pain Assessment: 0-10 Pain Score: 8  Pain Location: back Pain Descriptors / Indicators: Constant;Aching Pain Intervention(s): Monitored during session (RN provided pain meds)    Home Living                      Prior Function            PT Goals (current goals can now be found in the care plan section) Progress towards PT goals: Progressing toward goals    Frequency  Min 3X/week    PT Plan Current plan remains appropriate    Co-evaluation             End of Session Equipment Utilized During Treatment: Back brace Activity Tolerance:  Patient tolerated treatment well Patient left: in bed;with call bell/phone within reach     Time: 0935-1013 PT Time Calculation (min): 38 min  Charges:  $Therapeutic Activity: 23-37 mins $Neuromuscular Re-education: 8-22 mins                    G Codes:      Kingsley Callander 02/23/2014, 11:35 AM  Kittie Plater, PT, DPT Pager #: [phone] Office #: [phone]

## 2014-02-23 NOTE — Progress Notes (Signed)
PMR Admission Coordinator Pre-Admission Assessment  Patient: Yolanda Solis is an 80 y.o., female  MRN: [id number]  DOB: 11-Jan-1934  Height: 5\' 4"  (162.6 cm)  Weight: 94.7 kg (208 lb 12.4 oz)  Insurance Information  HMO: yes PPO: PCP: IPA: 80/20: OTHER: medicare replacement policy  PRIMARY: Bubba Hales Policy#: [id number] Subscriber: pt  CM Name: Katrina C. Phone#: [phone] Fax#: [phone]  Pre-Cert#: [id number] F/u with onsite CM Geryl Rankins at [id number] Employer: retired  Benefits: Phone #: [phone] Name: 8/28  Eff. Date: 06/24/13 Deduct: none Out of Pocket Max: $4900 Life Max: none  CIR: $345 per day daus 1-5 SNF: no copay days 1-20; $155 per day days 21-52; no copay days 53-100  Outpatient: $40 copay per visit Co-Pay: no visit limit  Home Health: 100% Co-Pay: none  DME: 80% Co-Pay: 20%  Providers: pt choice  Medicaid Application Date: Case Manager:  Disability Application Date: Case Worker:  Emergency Contact Information    Contact Information     Name  Relation  Home  Work  Campbell  Son  [phone]   [phone]     [name],[name]  Sister  [phone]          Current Medical History  Patient Admitting Diagnosis: T10 fx with complete paraplegia  History of Present Illness: Yolanda Solis is a 80 y.o. right-handed female with history of hypertension as well as diabetes mellitus. Patient independent prior to admission living alone. She does not drive.  Admitted 02/18/2014 after unwitnessed fall at home complaining of weakness to bilateral lower extremities. She denied loss of consciousness. X-rays and imaging revealed thoracic fracture T10 fracture dislocation Chance fracture with mild cord compression as well as cord edema. Underwent thoracolumbar repair T8-T12 fusion with laminectomy and decompression of spinal cord with posterior lateral arthrodesis 02/18/2014 per Dr. Vertell Limber. Hospital course EKG new onset atrial fibrillation maintained  on Cardizem. Aspirin lumbar fusion brace in place.  Past Medical History    Past Medical History    Diagnosis  Date    .  Diabetes mellitus     .  Hypertension     .  Arthritis      Family History  family history is not on file.  Prior Rehab/Hospitalizations: none  Current Medications  Current facility-administered medications:0.9 % sodium chloride infusion, 250 mL, Intravenous, Continuous, Erline Levine, MD; acetaminophen (TYLENOL) suppository 650 mg, 650 mg, Rectal, Q4H PRN, Erline Levine, MD; acetaminophen (TYLENOL) tablet 650 mg, 650 mg, Oral, Q4H PRN, Erline Levine, MD; alum & mag hydroxide-simeth (MAALOX/MYLANTA) 200-200-20 MG/5ML suspension 30 mL, 30 mL, Oral, Q6H PRN, Erline Levine, MD  antiseptic oral rinse (CPC / CETYLPYRIDINIUM CHLORIDE 0.05%) solution 7 mL, 7 mL, Mouth Rinse, BID, Erline Levine, MD, 7 mL at 02/23/14 1004; bisacodyl (DULCOLAX) suppository 10 mg, 10 mg, Rectal, Daily PRN, Erline Levine, MD, 10 mg at 02/23/14 1002; diazepam (VALIUM) tablet 1 mg, 1 mg, Oral, Q8H PRN, Rush Farmer, MD; diltiazem (CARDIZEM) tablet 60 mg, 60 mg, Oral, 4 times per day, Rush Farmer, MD, 60 mg at 02/23/14 1235  docusate sodium (COLACE) capsule 100 mg, 100 mg, Oral, BID, Erline Levine, MD, 100 mg at 02/23/14 1000; heparin injection 5,000 Units, 5,000 Units, Subcutaneous, 3 times per day, Rush Farmer, MD, 5,000 Units at 02/23/14 [phone]; HYDROcodone-acetaminophen (NORCO/VICODIN) 5-325 MG per tablet 1-2 tablet, 1-2 tablet, Oral, Q4H PRN, Erline Levine, MD, 1 tablet at 02/22/14 1446  insulin aspart (novoLOG) injection 2-6 Units, 2-6 Units, Subcutaneous,  6 times per day, Vilinda Boehringer, MD, 6 Units at 02/23/14 1235; insulin regular (NOVOLIN R,HUMULIN R) 250 Units in sodium chloride 0.9 % 250 mL (1 Units/mL) infusion, , Intravenous, Continuous, Rush Farmer, MD, 2.3 Units/hr at 02/18/14 2312; loratadine (CLARITIN) tablet 10 mg, 10 mg, Oral, Daily, Erline Levine, MD, 10 mg at 02/23/14 1002   menthol-cetylpyridinium (CEPACOL) lozenge 3 mg, 1 lozenge, Oral, PRN, Erline Levine, MD; morphine 2 MG/ML injection 1-4 mg, 1-4 mg, Intravenous, Q3H PRN, Erline Levine, MD, 2 mg at 02/18/14 0351; ondansetron [facility]) injection 4 mg, 4 mg, Intravenous, Q4H PRN, Erline Levine, MD; oxyCODONE-acetaminophen (PERCOCET/ROXICET) 5-325 MG per tablet 1-2 tablet, 1-2 tablet, Oral, Q4H PRN, Erline Levine, MD, 2 tablet at 02/23/14 0956  oxyCODONE-acetaminophen (PERCOCET/ROXICET) 5-325 MG per tablet 1-2 tablet, 1-2 tablet, Oral, Q4H PRN, Erline Levine, MD; pantoprazole (PROTONIX) EC tablet 40 mg, 40 mg, Oral, Q1200, Erline Levine, MD, 40 mg at 02/23/14 1235; phenol (CHLORASEPTIC) mouth spray 1 spray, 1 spray, Mouth/Throat, PRN, Erline Levine, MD; polyethylene glycol (MIRALAX / GLYCOLAX) packet 17 g, 17 g, Oral, Daily PRN, Erline Levine, MD, 17 g at 02/22/14 2332  senna (SENOKOT) tablet 8.6 mg, 1 tablet, Oral, BID, Erline Levine, MD, 8.6 mg at 02/23/14 0956; sodium chloride 0.9 % injection 3 mL, 3 mL, Intravenous, Q12H, Erline Levine, MD, 3 mL at 02/23/14 1003; sodium chloride 0.9 % injection 3 mL, 3 mL, Intravenous, PRN, Erline Levine, MD; zolpidem (AMBIEN) tablet 5 mg, 5 mg, Oral, QHS PRN, Erline Levine, MD  Patients Current Diet: Carb Control  Precautions / Restrictions  Precautions  Precautions: Back  Spinal Brace: Thoracolumbosacral orthotic;Applied in supine position  Restrictions  Weight Bearing Restrictions: No  Prior Activity Level  Limited Community (1-2x/wk): Mod I with walker pta. does not drive. Lived alone  Has been widowed for 37 years, independent and self sufficient  Sons drive her.  Home Assistive Devices / Equipment  Home Assistive Devices/Equipment: None  Prior Functional Level  Prior Function  Level of Independence: Independent  Current Functional Level    Cognition  Overall Cognitive Status: Within Functional Limits for tasks assessed  Orientation Level: Oriented X4    Extremity Assessment   (includes Sensation/Coordination)      ADLs  Overall ADL's : Needs assistance/impaired  Eating/Feeding: Set up;Sitting  Grooming: Set up;Sitting  Upper Body Bathing: Minimal assitance;Sitting  Lower Body Bathing: Total assistance;Bed level  Upper Body Dressing : Moderate assistance;Sitting  Upper Body Dressing Details (indicate cue type and reason): total A to donn/doff TLSO in supine  Lower Body Dressing: Total assistance;Bed level  Toilet Transfer: +2 for safety/equipment;Total assistance (sky lift used)  Toileting- Clothing Manipulation and Hygiene: Total assistance  Toileting - Clothing Manipulation Details (indicate cue type and reason): foley  Functional mobility during ADLs: +2 for physical assistance;Total assistance    Mobility  Overal bed mobility: Needs Assistance  Bed Mobility: Rolling  Rolling: Max assist;+2 for physical assistance  Sidelying to sit: Total assist;+2 for physical assistance  Sit to sidelying: Total assist;+2 for physical assistance  General bed mobility comments: maximal directional v/c's, pt attempted to assist with UEs    Transfers  Overall transfer level: Needs assistance  General transfer comment: maxi sky back to bed, PRAFO boots applied. Compression wraps removed, SCDs donned    Ambulation / Gait / Stairs / Wheelchair Mobility     Posture / Balance  Dynamic Sitting Balance  Sitting balance - Comments: pt did tolerate sitting EOB x 15 min. pt remains to have posterior  lean however pt able to maintain sitting balance with min guard x 30 sec while holding onto PTs forearm. Pt overall with intermittent support of min to max to maintain EOB balance. Pt BP at 134/74 without ace wraps but did educated RN on benefit of compression ted hose    Special needs/care consideration  Bowel mgmt: as of 9/2, no BM since admit  Bladder mgmt:foley  Diabetic mgmt yes    Previous Home Environment  Living Arrangements: Alone  Lives With: Alone  Available Help at  Discharge: Available 24 hours/day;Family  Type of Home: House  Home Layout: One level  Home Access: Stairs to enter  [employer] of Steps: 4 to 5 steps  Bathroom Shower/Tub: Administrator, Civil Service: Standard  Bathroom Accessibility: Yes  How Accessible: Accessible via walker  [place]: No  Discharge Living Setting  Plans for Discharge Living Setting: Lives with (comment);Other (Comment) (Plans to go live with son, Jori Moll)  Type of Home at Discharge: House  Discharge Home Layout: One level  Discharge Home Access: Stairs to enter  Entrance Stairs-Number of Steps: level entry  Discharge Bathroom Shower/Tub: Tub/shower unit  Discharge Bathroom Toilet: Standard  Discharge Bathroom Accessibility: Yes  How Accessible: Accessible via walker  Does the patient have any problems obtaining your medications?: No  Son plans for pt to live in his home for more wheelchair accessible. Family to assist in her care. They are aware they will ned to be involved in family education prior to d/c home.  Caregivers are:  Lissett Favorite, son [phone]  Latanya Presser, son's fiance and CNA at [facility] [phone]  Gerald Stabs and Ardelle Lesches, grandson and his wife; EMT and RN [phone]  Nicole Kindred and Vonzell Schlatter, grandson and his wife, wife is a CNA [phone]  Horton Chin and her sister Ladoris Gene, pt's cousins and CNAs [phone]  I have told Jori Moll that caregiver education will be arranged for her prior to d/c home  Social/Family/Support Systems  Patient Roles: Parent  Contact Information: Manon Hilding, son  Anticipated Caregiver: son and his fiance, grandsons, cousins, etc  Anticipated Caregiver's Contact Information: Manon Hilding, son cell [phone]  Ability/Limitations of Caregiver: son works days. Family pulling together 24/7 physical care at home  Caregiver Availability: 24/7  Discharge Plan Discussed with Primary Caregiver: Yes  Is Caregiver In Agreement with Plan?:  Yes  Does Caregiver/Family have Issues with Lodging/Transportation while Pt is in Rehab?: No  Goals/Additional Needs  Patient/Family Goal for Rehab: MIn to mod assist PT and OT at wheelchair level  Expected length of stay: ELOS 20 to 30 days  Equipment Needs: using maxisky on acute to get pt up to chair; son working on small ramp area at his home for pt  Additional Information: Pt and son need very basic explanations of goals and expectations of her diagnosis. They have been told pt unlikely to ever walk again but they do not want to give up hope. I have told them she will go home in a wheelchair and need assist with all adls which incude a bowel and bladder program, etc.  Pt/Family Agrees to Admission and willing to participate: Yes  Program Orientation Provided & Reviewed with Pt/Caregiver Including Roles & Responsibilities: Yes  Decrease burden of Care through IP rehab admission: n/a  Possible need for SNF placement upon discharge:I discussed with pt and Jori Moll that the goal is for d/c home with family educated on how to care for her at wheelchair level.  Patient  Condition: This patient's medical and functional status has changed since the consult dated: 02/19/2014 in which the Rehabilitation Physician determined and documented that the patient's condition is appropriate for intensive rehabilitative care in an inpatient rehabilitation facility. See "History of Present Illness" (above) for medical update. Functional changes are: overall total assist. Patient's medical and functional status update has been discussed with the Rehabilitation physician and patient remains appropriate for inpatient rehabilitation. Will admit to inpatient rehab today.  Preadmission Screen Completed By: Cleatrice Burke, 02/23/2014 1:26 PM  ______________________________________________________________________  Discussed status with Dr. Naaman Plummer on 02/23/2014 at 1326 and received telephone approval for admission today.   Admission Coordinator: [place], [place], time 4034 Date 02/23/2014.    Cosigned by: Meredith Staggers, MD [02/23/2014 1:39 PM]

## 2014-02-23 NOTE — Progress Notes (Signed)
I have insurance approval for pt to admit to inpt rehab today. Pt and and son in agreement. I have notified Verdis Prime, RN for Dr. Vertell Limber and he is in agreement to d/c to rehab today. I have asked RN to give pt suppository today. Pt working with therapy. I will make arrangements for admit today. [phone]

## 2014-02-23 NOTE — Progress Notes (Signed)
Occupational Therapy Treatment Patient Details Name: Yolanda Solis MRN: [id number] DOB: 01-18-1934 Today's Date: 02/23/2014    History of present illness 80 year old female presented to [facility] 8/27 s/p fall on 8/26. She was complaining of weakness to bilateral lower extremities. In ED found to have unstable T10 fracture. She was transferred to [facility] for emergent stabilization of fracture and decompression of spinal cord. Patient s/p Thoracolumbar Repair T8 - T12 fusion with laminectomy and decompression of spinal cord with posterolateral arthrodesis.   OT comments  Pt provided ted hose thigh high to help with BP regulation. Pt performed bed level adl care. Pt eager to begin CIR rehab and verbalized being grateful for OT to visit this session.    Follow Up Recommendations  CIR;Supervision/Assistance - 24 hour    Equipment Recommendations  3 in 1 bedside comode;Tub/shower bench;Wheelchair (measurements OT);Wheelchair cushion (measurements OT);Hospital bed    Recommendations for Other Services Rehab consult    Precautions / Restrictions Precautions Precautions: Back Required Braces or Orthoses: Spinal Brace Spinal Brace: Thoracolumbosacral orthotic;Applied in supine position       Mobility Bed Mobility Overal bed mobility: Needs Assistance Bed Mobility: Rolling Rolling: +2 for physical assistance;Mod assist         General bed mobility comments: pt initiated with UB and needs (A) to position BIL LE. pt educated on back precautions to move as a log. Pt reports discomfort in side lying  Transfers                      Balance                                   ADL Overall ADL's : Needs assistance/impaired Eating/Feeding: Set up;Sitting       Upper Body Bathing: Moderate assistance;Bed level Upper Body Bathing Details (indicate cue type and reason): requires (A) for arm pits     Upper Body Dressing : Minimal assistance;Bed level   Lower Body  Dressing: Total assistance Lower Body Dressing Details (indicate cue type and reason): don ted hose to thighs               General ADL Comments: Pt agreeable to UB bathing and don new gown. pt agreeable to log rolling. pt declines EOB dangling or OOB to chair. OT offering to push in chair out of room but still declines. pt repositioned to [facility] and supine.      Vision                     Perception     Praxis      Cognition   Behavior During Therapy: WFL for tasks assessed/performed Overall Cognitive Status: Within Functional Limits for tasks assessed                       Extremity/Trunk Assessment               Exercises     Shoulder Instructions       General Comments      Pertinent Vitals/ Pain       Pain Assessment: Faces Faces Pain Scale: Hurts even more Pain Location: back Pain Intervention(s): Premedicated before session (pt reports pain in better)  Home Living     Available Help at Discharge: Available 24 hours/day;Family Type of Home: House Home Access: Stairs to enter [employer] of Steps:  4 to 5 steps   Home Layout: One level     Bathroom Shower/Tub: Teacher, early years/pre: Standard Bathroom Accessibility: Yes How Accessible: Accessible via walker        Lives With: Alone    Prior Functioning/Environment              Frequency Min 2X/week     Progress Toward Goals  OT Goals(current goals can now be found in the care plan section)  Progress towards OT goals: Progressing toward goals  Acute Rehab OT Goals Patient Stated Goal: to be independent again  OT Goal Formulation: With patient Time For Goal Achievement: 03/07/14 Potential to Achieve Goals: Good ADL Goals Pt Will Perform Upper Body Bathing: sitting;with set-up Pt Will Perform Lower Body Bathing: with mod assist;bed level;with adaptive equipment Pt Will Transfer to Toilet: with +2 assist;with mod assist;bedside  commode Additional ADL Goal #1: Pt will tolerate sitting EOB x 10 min with S in preparation for ADL  Plan Discharge plan remains appropriate    Co-evaluation                 End of Session     Activity Tolerance Patient tolerated treatment well   Patient Left in bed;with call bell/phone within reach   Nurse Communication Mobility status;Precautions        Time: 1037-1100 OT Time Calculation (min): 23 min  Charges: OT General Charges $OT Visit: 1 Procedure OT Treatments $Self Care/Home Management : 23-37 mins  Parke Poisson B 02/23/2014, 2:21 PM Pager: [phone]

## 2014-02-23 NOTE — Progress Notes (Signed)
Pt transferred to 4W via bed.  VSS upon transfer. Cori Razor, RN 02/23/2014

## 2014-02-23 NOTE — Progress Notes (Signed)
Subjective: Patient reports the same  Objective: Vital signs in last 24 hours: Temp:  [97.6 F (36.4 C)-99.1 F (37.3 C)] 98 F (36.7 C) (09/02 0519) Pulse Rate:  [74-100] 74 (09/02 0519) Resp:  [18] 18 (09/02 0519) BP: (110-167)/(47-84) 116/52 mmHg (09/02 0519) SpO2:  [91 %-99 %] 97 % (09/02 0519)  Intake/Output from previous day: 09/01 0701 - 09/02 0700 In: 240 [P.O.:240] Out: 750 [Urine:750] Intake/Output this shift:    Physical Exam: No improvement in lower extremity strength or numbness  Lab Results:  Recent Labs  02/22/14 0620 02/23/14 0338  WBC 14.1* 15.3*  HGB 11.8* 11.6*  HCT 37.3 36.0  PLT 233 232   BMET  Recent Labs  02/21/14 0231 02/22/14 0620  NA 138 136*  K 3.9 4.2  CL 100 98  CO2 27 24  GLUCOSE 148* 180*  BUN 22 28*  CREATININE 0.83 0.95  CALCIUM 8.4 8.6    Studies/Results: No results found.  Assessment/Plan: Transfer to Rehab.  Complete paraplegia persists.    LOS: 6 days    Peggyann Shoals, MD 02/23/2014, 8:01 AM

## 2014-02-23 NOTE — Care Management Note (Addendum)
  Page 1 of 1   02/23/2014     2:06:17 PM CARE MANAGEMENT NOTE 02/23/2014  Patient:  Yolanda Solis, Yolanda Solis   Account Number:  [id number]  Date Initiated:  02/23/2014  Documentation initiated by:  Lorne Skeens  Subjective/Objective Assessment:   Patient was admitted for T8-12 fusion, s/p fall. Lives at home alone.     Action/Plan:   Will follow for discharge needs pending PT/OT evals and physician orders.   Anticipated DC Date:  02/23/2014   Anticipated DC Plan:  IP REHAB FACILITY      DC Planning Services  CM consult      Choice offered to / List presented to:             Status of service:  Completed, signed off Medicare Important Message given?  YES (If response is "NO", the following Medicare IM given date fields will be blank) Date Medicare IM given:  02/23/2014 Medicare IM given by:  Lorne Skeens Date Additional Medicare IM given:   Additional Medicare IM given by:    Discharge Disposition:    Per UR Regulation:    If discussed at Long Length of Stay Meetings, dates discussed:    Comments:  02/23/14 Fate RN, MSN, CM- Medicare IM letter provided.

## 2014-02-23 NOTE — Progress Notes (Signed)
Physical Medicine and Rehabilitation Consult Reason for Consult: Thoracic fracture T10 fracture dislocation(Chance fracture) with acute paraplegia Referring Physician: Dr. Vertell Limber     HPI: Yolanda Solis is a 80 y.o. right-handed female with history of hypertension as well as diabetes mellitus. Patient independent prior to admission living alone. She does not drive. Admitted 02/18/2014 after unwitnessed fall at home complaining of weakness to bilateral lower extremities. She denied loss of consciousness. X-rays and imaging revealed thoracic fracture T10 fracture dislocation Chance fracture with mild cord compression as well as cord edema. Underwent thoracolumbar repair T8-T12 fusion with laminectomy and decompression of spinal cord with posterior lateral arthrodesis 02/18/2014 per Dr. Vertell Limber. Hospital course EKG new onset atrial fibrillation maintained on Cardizem. Aspirin lumbar fusion brace in place. Physical and occupational therapy evaluations pending. M.D. has requested physical medicine rehabilitation consult.     Review of Systems  Gastrointestinal: Positive for constipation.  Musculoskeletal: Positive for falls and myalgias.  All other systems reviewed and are negative. Past Medical History   Diagnosis  Date   .  Diabetes mellitus     .  Hypertension     .  Arthritis      Past Surgical History   Procedure  Laterality  Date   .  Abdominal hysterectomy        No family history on file. Social History: reports that she has never smoked. She does not have any smokeless tobacco history on file. She reports that she does not drink alcohol or use illicit drugs. Allergies: No Known Allergies Medications Prior to Admission   Medication  Sig  Dispense  Refill   .  diazepam (VALIUM) 5 MG tablet  Take 0.5 tablets (2.5 mg total) by mouth every 8 (eight) hours as needed for anxiety.   10 tablet   0   .  lisinopril (PRINIVIL,ZESTRIL) 5 MG tablet  Take 5 mg by mouth daily.         Marland Kitchen   loratadine (ALLERGY RELIEF) 10 MG tablet  Take 10 mg by mouth daily.         .  metFORMIN (GLUCOPHAGE) 500 MG tablet  Take 500 mg by mouth 2 (two) times daily with a meal.          .  oxyCODONE-acetaminophen (PERCOCET/ROXICET) 5-325 MG per tablet  Take 1-2 tablets by mouth every 4 (four) hours as needed for severe pain.   20 tablet   0   .  docusate sodium (COLACE) 100 MG capsule  Take 1 capsule (100 mg total) by mouth every 12 (twelve) hours.   30 capsule   0   .  ibuprofen (ADVIL,MOTRIN) 200 MG tablet  Take 400 mg by mouth at bedtime.             Home:   Functional History: Functional Status:   Mobility:   ADL:   Cognition: Cognition Orientation Level: Oriented to person;Oriented to situation   Blood pressure 133/109, pulse 119, temperature 97.3 F (36.3 C), temperature source Axillary, resp. rate 19, height 5\' 4"  (1.626 m), weight 94.7 kg (208 lb 12.4 oz), SpO2 93.00%. Physical Exam  Constitutional: She is oriented to person, place, and time. She appears well-developed.  HENT:   Head: Normocephalic.  Eyes: EOM are normal.  Neck: Normal range of motion. Neck supple. No thyromegaly present.  Cardiovascular: Normal rate and regular rhythm.   Respiratory: Effort normal and breath sounds normal. No respiratory distress.  GI: Soft. Bowel sounds are normal. She exhibits no  distension.  Neurological: She is alert and oriented to person, place, and time.  Skin:  Back incision was not examined.     Results for orders placed during the hospital encounter of 02/17/14 (from the past 24 hour(s))   BASIC METABOLIC PANEL     Status: Abnormal     Collection Time      02/17/14  8:08 PM       Result  Value  Ref Range     Sodium  137   137 - 147 mEq/L     Potassium  3.9   3.7 - 5.3 mEq/L     Chloride  95 (*)  96 - 112 mEq/L     CO2  24   19 - 32 mEq/L     Glucose, Bld  275 (*)  70 - 99 mg/dL     BUN  19   6 - 23 mg/dL     Creatinine, Ser  0.82   0.50 - 1.10 mg/dL     Calcium  9.3    8.4 - 10.5 mg/dL     GFR calc non Af Amer  67 (*)  >90 mL/min     GFR calc Af Amer  78 (*)  >90 mL/min     Anion gap  18 (*)  5 - 15   CBC WITH DIFFERENTIAL     Status: Abnormal     Collection Time      02/17/14  8:08 PM       Result  Value  Ref Range     WBC  20.2 (*)  4.0 - 10.5 K/uL     RBC  4.38   3.87 - 5.11 MIL/uL     Hemoglobin  13.4   12.0 - 15.0 g/dL     HCT  40.1   36.0 - 46.0 %     MCV  91.6   78.0 - 100.0 fL     MCH  30.6   26.0 - 34.0 pg     MCHC  33.4   30.0 - 36.0 g/dL     RDW  13.4   11.5 - 15.5 %     Platelets  223   150 - 400 K/uL     Neutrophils Relative %  89 (*)  43 - 77 %     Lymphocytes Relative  7 (*)  12 - 46 %     Monocytes Relative  4   3 - 12 %     Eosinophils Relative  0   0 - 5 %     Basophils Relative  0   0 - 1 %     Neutro Abs  18.0 (*)  1.7 - 7.7 K/uL     Lymphs Abs  1.4   0.7 - 4.0 K/uL     Monocytes Absolute  0.8   0.1 - 1.0 K/uL     Eosinophils Absolute  0.0   0.0 - 0.7 K/uL     Basophils Absolute  0.0   0.0 - 0.1 K/uL     RBC Morphology  STOMATOCYTES        WBC Morphology  ATYPICAL LYMPHOCYTES      URINALYSIS, ROUTINE W REFLEX MICROSCOPIC     Status: Abnormal     Collection Time      02/17/14  9:55 PM       Result  Value  Ref Range     Color, Urine  YELLOW   YELLOW  APPearance  CLEAR   CLEAR     Specific Gravity, Urine  >1.030 (*)  1.005 - 1.030     pH  5.0   5.0 - 8.0     Glucose, UA  500 (*)  NEGATIVE mg/dL     Hgb urine dipstick  SMALL (*)  NEGATIVE     Bilirubin Urine  NEGATIVE   NEGATIVE     Ketones, ur  15 (*)  NEGATIVE mg/dL     Protein, ur  100 (*)  NEGATIVE mg/dL     Urobilinogen, UA  0.2   0.0 - 1.0 mg/dL     Nitrite  NEGATIVE   NEGATIVE     Leukocytes, UA  NEGATIVE   NEGATIVE   URINE MICROSCOPIC-ADD ON     Status: None     Collection Time      02/17/14  9:55 PM       Result  Value  Ref Range     Squamous Epithelial / LPF  RARE   RARE     WBC, UA  0-2   <3 WBC/hpf     RBC / HPF  0-2   <3 RBC/hpf     Bacteria, UA   RARE   RARE   MRSA PCR SCREENING     Status: None     Collection Time      02/18/14 12:45 AM       Result  Value  Ref Range     MRSA by PCR  NEGATIVE   NEGATIVE   GLUCOSE, CAPILLARY     Status: Abnormal     Collection Time      02/18/14  3:20 AM       Result  Value  Ref Range     Glucose-Capillary  263 (*)  70 - 99 mg/dL   GLUCOSE, CAPILLARY     Status: Abnormal     Collection Time      02/18/14  5:36 AM       Result  Value  Ref Range     Glucose-Capillary  293 (*)  70 - 99 mg/dL    Dg Thoracic Spine 4v   02/18/2014   CLINICAL DATA:  T8-T12 fusion.  EXAM: THORACIC SPINE - 4+ VIEW; DG C-ARM 61-120 MIN  COMPARISON:  MRI thoracic spine 02/17/2014  FINDINGS: Intraoperative fluoroscopy is obtained for surgical control purposes. Fluoroscopy time is not recorded. AP and lateral spot compression views over the lower thoracic spine obtained. Specific localization of vertebral levels is limited due to the field of view. The previous MR examination demonstrated a compression fracture at T10 and presumably the examination is centered on the T10 vertebra. Given this assumption, posterior pedicular screws are demonstrated at T8, T9, T11, and T12 levels.  IMPRESSION: Intraoperative fluoroscopy obtained for surgical localization purposes.   Electronically Signed   By: Lucienne Capers M.D.   On: 02/18/2014 02:32    Dg Thoracolumabar Spine   02/18/2014   CLINICAL DATA:  TA to T12 fusion.  EXAM: THORACOLUMBAR SPINE - 2 VIEW  COMPARISON:  MRI thoracic spine 02/17/2014.  Chest 08/15/2011  FINDINGS: Single AP portable view of the mid thoracic and upper lumbar region is obtained for surgical control purposes. Examination is technically limited due to underpenetration in the lumbar region. Determination of specific vertebral levels is not possible with a degree of certainty. Multiple metallic localization markers are projected over the spine at multiple levels.  IMPRESSION: Portable view of the thoracolumbar spine  is obtained  for surgical localization purposes. Technically limited study.   Electronically Signed   By: Lucienne Capers M.D.   On: 02/18/2014 02:29    Dg Lumbar Spine Complete   02/16/2014   CLINICAL DATA:  80 year old female with low back pain after a fall. Initial encounter.  EXAM: LUMBAR SPINE - COMPLETE 4+ VIEW  COMPARISON:  CT Abdomen and Pelvis 12/25/2006.  FINDINGS: Progressed and bulky diffuse endplate spurring throughout the lumbar spine. Normal lumbar segmentation. Chronic vacuum disc phenomena at L4-L5. No lumbar compression fracture identified. Grossly stable and intact visualized lower thoracic levels. Moderate lower lumbar facet hypertrophy. No pars fracture identified. Extensive Aortoiliac calcified atherosclerosis noted. Grossly intact sacral ala and SI joints.  IMPRESSION: 1.  No acute fracture or listhesis identified in the lumbar spine. 2. Diffuse idiopathic skeletal hyperostosis 3. Chronically advanced L4-L5 disc degeneration.   Electronically Signed   By: Lars Pinks M.D.   On: 02/16/2014 14:46    Mr Thoracic Spine Wo Contrast   02/17/2014   ADDENDUM REPORT: 02/17/2014 21:41  ADDENDUM: Critical Value/emergent results were called by telephone at the time of interpretation on 02/17/2014 at 9:40 pm to Dr. Ripley Fraise , who verbally acknowledged these results.   Electronically Signed   By: Franchot Gallo M.D.   On: 02/17/2014 21:41    02/17/2014   CLINICAL DATA:  [place] yesterday.  Back pain.  Bilateral leg weakness  EXAM: MRI THORACIC SPINE WITHOUT CONTRAST  TECHNIQUE: Multiplanar, multisequence MR imaging of the thoracic spine was performed. No intravenous contrast was administered.  COMPARISON:  None.  FINDINGS: Displaced chance fracture of T10. Horizontal fracture through the T10 vertebral body with hematoma. There is anterior displacement of the upper half of T10 on the lower half by approximately 6 mm. Fracture extends into the pedicles bilaterally. There is probable epidural  hematoma. There is cord compression and mild cord signal abnormality. This fracture is unstable.  No other fracture.  No disc protrusion identified.  IMPRESSION: Displaced chance fracture through the T10 vertebral body. There is spinal stenosis and mild cord compression and mild cord edema. This fracture is unstable.  Electronically Signed: By: Franchot Gallo M.D. On: 02/17/2014 21:31    Dg C-arm 61-120 Min   02/18/2014   CLINICAL DATA:  T8-T12 fusion.  EXAM: THORACIC SPINE - 4+ VIEW; DG C-ARM 61-120 MIN  COMPARISON:  MRI thoracic spine 02/17/2014  FINDINGS: Intraoperative fluoroscopy is obtained for surgical control purposes. Fluoroscopy time is not recorded. AP and lateral spot compression views over the lower thoracic spine obtained. Specific localization of vertebral levels is limited due to the field of view. The previous MR examination demonstrated a compression fracture at T10 and presumably the examination is centered on the T10 vertebra. Given this assumption, posterior pedicular screws are demonstrated at T8, T9, T11, and T12 levels.  IMPRESSION: Intraoperative fluoroscopy obtained for surgical localization purposes.   Electronically Signed   By: Lucienne Capers M.D.   On: 02/18/2014 02:32     Assessment/Plan: Diagnosis: T10 fx with complete paraplegia Does the need for close, 24 hr/day medical supervision in concert with the patient's rehab needs make it unreasonable for this patient to be served in a less intensive setting? Yes Co-Morbidities requiring supervision/potential complications: neurogenic bowel,bladder Due to bladder management, bowel management, safety, skin/wound care, disease management, medication administration, pain management and patient education, does the patient require 24 hr/day rehab nursing? Yes Does the patient require coordinated care of a physician, rehab nurse, PT (1-2 hrs/day, 5 days/week)  and OT (1-2 hrs/day, 5 days/week) to address physical and functional  deficits in the context of the above medical diagnosis(es)? Yes Addressing deficits in the following areas: balance, endurance, locomotion, strength, transferring, bowel/bladder control, bathing, dressing, feeding, grooming, toileting and psychosocial support Can the patient actively participate in an intensive therapy program of at least 3 hrs of therapy per day at least 5 days per week? Yes and Potentially The potential for patient to make measurable gains while on inpatient rehab is good Anticipated functional outcomes upon discharge from inpatient rehab are min assist  with PT, supervision, min assist and mod assist with OT, n/a with SLP. Estimated rehab length of stay to reach the above functional goals is: 20-30 days Does the patient have adequate social supports to accommodate these discharge functional goals? Yes and Potentially Anticipated D/C setting: Home Anticipated post D/C treatments: HH therapy and Outpatient therapy Overall Rehab/Functional Prognosis: good   RECOMMENDATIONS: This patient's condition is appropriate for continued rehabilitative care in the following setting: CIR Patient has agreed to participate in recommended program. Yes Note that insurance prior authorization may be required for reimbursement for recommended care.   Comment: Will follow along for now.   Meredith Staggers, MD, [facility]         02/18/2014    Revision History...     Date/Time User Action   02/18/2014 10:00 AM Meredith Staggers, MD Sign   02/18/2014 6:42 AM Cathlyn Parsons, PA-C Pend  View Details Report   Routing History...     Date/Time From To Method   02/18/2014 10:00 AM Meredith Staggers, MD Meredith Staggers, MD In Basket   02/18/2014 10:00 AM Meredith Staggers, MD Elsie Lincoln, MD Fax

## 2014-02-23 NOTE — Progress Notes (Signed)
Patient ID: Yolanda Solis, female   DOB: 08/08/1933, 80 y.o.   MRN: [id number] Patient admitted to [phone] via bed, escorted by nursing staff.  Patient verbalized understanding of rehab process, is hesitant to doing 3 hours of therapy a day due to pain limiting movement.  VSS.  Appears to be in no immediate distress at this time.  Brita Romp, RN

## 2014-02-23 NOTE — H&P (Signed)
Physical Medicine and Rehabilitation Admission H&P    Chief Complaint  Patient presents with  . Numbness  . Leg Pain  : HPI: Yolanda Solis is a 80 y.o. right-handed female with history of hypertension as well as diabetes mellitus. Patient independent prior to admission living alone. She does not drive. Admitted 02/18/2014 after unwitnessed fall at home complaining of weakness to bilateral lower extremities. She denied loss of consciousness. X-rays and imaging revealed thoracic fracture T10 fracture dislocation Chance fracture with mild cord compression as well as cord edema. Underwent thoracolumbar repair T8-T12 fusion with laminectomy and decompression of spinal cord with posterior lateral arthrodesis 02/18/2014 per Dr. Vertell Limber. Hospital course EKG new onset atrial fibrillation maintained on Cardizem. Back brace applied in supine position. Subcutaneous heparin added for DVT prophylaxis. Physical and occupational therapy evaluations pending. M.D. has requested physical medicine rehabilitation consult. Patient was admitted for comprehensive rehabilitation program   Review of Systems  Constitutional: Negative for fever and chills.  HENT: Negative for hearing loss.   Eyes: Negative for blurred vision and double vision.  Gastrointestinal: Positive for constipation. Negative for nausea and vomiting.  Skin: Negative for itching and rash.  Neurological: Positive for tingling, sensory change and focal weakness. Negative for seizures and headaches.  Psychiatric/Behavioral: The patient has insomnia.    Review of Systems  Gastrointestinal: Positive for constipation.  Musculoskeletal: Positive for falls and myalgias.  All other systems reviewed and are negative  Past Medical History  Diagnosis Date  . Diabetes mellitus   . Hypertension   . Arthritis    Past Surgical History  Procedure Laterality Date  . Abdominal hysterectomy    . Posterior lumbar fusion 4 level N/A 02/17/2014   Procedure: Thoracolumbar Repair;  Surgeon: Erline Levine, MD;  Location: [facility];  Service: Neurosurgery;  Laterality: N/A;  Thoracolumbar Repair T8 - T12   No family history on file. Social History:  reports that she has never smoked. She does not have any smokeless tobacco history on file. She reports that she does not drink alcohol or use illicit drugs. Allergies: No Known Allergies Medications Prior to Admission  Medication Sig Dispense Refill  . diazepam (VALIUM) 5 MG tablet Take 0.5 tablets (2.5 mg total) by mouth every 8 (eight) hours as needed for anxiety.  10 tablet  0  . lisinopril (PRINIVIL,ZESTRIL) 5 MG tablet Take 5 mg by mouth daily.      Marland Kitchen loratadine (ALLERGY RELIEF) 10 MG tablet Take 10 mg by mouth daily.      . metFORMIN (GLUCOPHAGE) 500 MG tablet Take 500 mg by mouth 2 (two) times daily with a meal.       . oxyCODONE-acetaminophen (PERCOCET/ROXICET) 5-325 MG per tablet Take 1-2 tablets by mouth every 4 (four) hours as needed for severe pain.  20 tablet  0  . docusate sodium (COLACE) 100 MG capsule Take 1 capsule (100 mg total) by mouth every 12 (twelve) hours.  30 capsule  0  . ibuprofen (ADVIL,MOTRIN) 200 MG tablet Take 400 mg by mouth at bedtime.         Home: Home Living Family/patient expects to be discharged to:: Inpatient rehab Living Arrangements: Alone   Functional History: Prior Function Level of Independence: Independent  Functional Status:  Mobility: Bed Mobility Overal bed mobility: Needs Assistance Bed Mobility: Rolling Rolling: Max assist;+2 for physical assistance Sidelying to sit: Total assist;+2 for physical assistance Sit to sidelying: Total assist;+2 for physical assistance General bed mobility comments: assisted with rolling  to take off brace once returned to bed Transfers Overall transfer level: Needs assistance General transfer comment: maxi sky back to bed, PRAFO boots applied. Compression wraps removed, SCDs donned       ADL: ADL Overall ADL's : Needs assistance/impaired Eating/Feeding: Set up;Sitting Grooming: Set up;Sitting Upper Body Bathing: Minimal assitance;Sitting Lower Body Bathing: Total assistance;Bed level Upper Body Dressing : Moderate assistance;Sitting Upper Body Dressing Details (indicate cue type and reason): total A to donn/doff TLSO in supine Lower Body Dressing: Total assistance;Bed level Toilet Transfer: +2 for safety/equipment;Total assistance (sky lift used) Toileting- Clothing Manipulation and Hygiene: Total assistance Toileting - Clothing Manipulation Details (indicate cue type and reason): foley Functional mobility during ADLs: +2 for physical assistance;Total assistance  Cognition: Cognition Overall Cognitive Status: Within Functional Limits for tasks assessed Orientation Level: Oriented X4 Cognition Arousal/Alertness: Awake/alert Behavior During Therapy: WFL for tasks assessed/performed Overall Cognitive Status: Within Functional Limits for tasks assessed  Physical Exam: Blood pressure 125/54, pulse 86, temperature 98.9 F (37.2 C), temperature source Oral, resp. rate 18, height _0  (1.626 m), weight 94.7 kg (208 lb 12.4 oz), SpO2 94.00%. Physical Exam Constitutional: She is oriented to person, place, and time. She appears well-developed.  HENT: oral mucosa pink/moist Head: Normocephalic.  Eyes: EOM are normal.  Neck: Normal range of motion. Neck supple. No thyromegaly present.  Cardiovascular: Normal rate and regular rhythm. No murmurs Respiratory: Effort normal and breath sounds normal. No respiratory distress. No wheezes GI: Soft. Bowel sounds are normal. She exhibits mild distension and high pitched bowel sounds.    Skin:  Back incision was not examined with brace in place Neuro: alert and oriented. Cognitively appears appropriate T12 sensory level---0/2 sensation below the level 0/5 strength below waist. No resting tone.  UE motor 5/5, UE sensory normal.  CN intact. Cognition appropriate Psych: normal  Results for orders placed during the hospital encounter of 02/17/14 (from the past 48 hour(s))  GLUCOSE, CAPILLARY     Status: Abnormal   Collection Time    02/21/14 11:37 AM      Result Value Ref Range   Glucose-Capillary 259 (*) 70 - 99 mg/dL   Comment 1 Notify RN     Comment 2 Documented in Chart    GLUCOSE, CAPILLARY     Status: Abnormal   Collection Time    02/21/14  3:55 PM      Result Value Ref Range   Glucose-Capillary 166 (*) 70 - 99 mg/dL   Comment 1 Notify RN     Comment 2 Documented in Chart    GLUCOSE, CAPILLARY     Status: Abnormal   Collection Time    02/21/14  7:30 PM      Result Value Ref Range   Glucose-Capillary 215 (*) 70 - 99 mg/dL   Comment 1 Documented in Chart     Comment 2 Notify RN    GLUCOSE, CAPILLARY     Status: Abnormal   Collection Time    02/22/14 12:04 AM      Result Value Ref Range   Glucose-Capillary 213 (*) 70 - 99 mg/dL   Comment 1 Documented in Chart     Comment 2 Notify RN    GLUCOSE, CAPILLARY     Status: Abnormal   Collection Time    02/22/14  4:22 AM      Result Value Ref Range   Glucose-Capillary 185 (*) 70 - 99 mg/dL   Comment 1 Documented in Chart     Comment 2 Notify  RN    CBC     Status: Abnormal   Collection Time    02/22/14  6:20 AM      Result Value Ref Range   WBC 14.1 (*) 4.0 - 10.5 K/uL   RBC 3.99  3.87 - 5.11 MIL/uL   Hemoglobin 11.8 (*) 12.0 - 15.0 g/dL   HCT 37.3  36.0 - 46.0 %   MCV 93.5  78.0 - 100.0 fL   MCH 29.6  26.0 - 34.0 pg   MCHC 31.6  30.0 - 36.0 g/dL   RDW 13.4  11.5 - 15.5 %   Platelets 233  150 - 400 K/uL  BASIC METABOLIC PANEL     Status: Abnormal   Collection Time    02/22/14  6:20 AM      Result Value Ref Range   Sodium 136 (*) 137 - 147 mEq/L   Potassium 4.2  3.7 - 5.3 mEq/L   Chloride 98  96 - 112 mEq/L   CO2 24  19 - 32 mEq/L   Glucose, Bld 180 (*) 70 - 99 mg/dL   BUN 28 (*) 6 - 23 mg/dL   Creatinine, Ser 0.95  0.50 - 1.10 mg/dL    Calcium 8.6  8.4 - 10.5 mg/dL   GFR calc non Af Amer 56 (*) >90 mL/min   GFR calc Af Amer 65 (*) >90 mL/min   Comment: (NOTE)     The eGFR has been calculated using the CKD EPI equation.     This calculation has not been validated in all clinical situations.     eGFR's persistently <90 mL/min signify possible Chronic Kidney     Disease.   Anion gap 14  5 - 15  PHOSPHORUS     Status: None   Collection Time    02/22/14  6:20 AM      Result Value Ref Range   Phosphorus 3.5  2.3 - 4.6 mg/dL  MAGNESIUM     Status: None   Collection Time    02/22/14  6:20 AM      Result Value Ref Range   Magnesium 2.0  1.5 - 2.5 mg/dL  TSH     Status: None   Collection Time    02/22/14  6:30 AM      Result Value Ref Range   TSH 1.730  0.350 - 4.500 uIU/mL  GLUCOSE, CAPILLARY     Status: Abnormal   Collection Time    02/22/14  8:57 AM      Result Value Ref Range   Glucose-Capillary 186 (*) 70 - 99 mg/dL  GLUCOSE, CAPILLARY     Status: Abnormal   Collection Time    02/22/14  9:01 AM      Result Value Ref Range   Glucose-Capillary 165 (*) 70 - 99 mg/dL   Comment 1 Documented in Chart     Comment 2 Notify RN    GLUCOSE, CAPILLARY     Status: Abnormal   Collection Time    02/22/14 12:22 PM      Result Value Ref Range   Glucose-Capillary 207 (*) 70 - 99 mg/dL   Comment 1 Notify RN     Comment 2 Documented in Chart    GLUCOSE, CAPILLARY     Status: Abnormal   Collection Time    02/22/14  4:46 PM      Result Value Ref Range   Glucose-Capillary 202 (*) 70 - 99 mg/dL   Comment 1 Notify RN  Comment 2 Documented in Chart    GLUCOSE, CAPILLARY     Status: Abnormal   Collection Time    02/22/14  7:41 PM      Result Value Ref Range   Glucose-Capillary 229 (*) 70 - 99 mg/dL   Comment 1 Documented in Chart     Comment 2 Notify RN    GLUCOSE, CAPILLARY     Status: Abnormal   Collection Time    02/23/14 12:01 AM      Result Value Ref Range   Glucose-Capillary 233 (*) 70 - 99 mg/dL   Comment 1  Documented in Chart     Comment 2 Notify RN    CBC     Status: Abnormal   Collection Time    02/23/14  3:38 AM      Result Value Ref Range   WBC 15.3 (*) 4.0 - 10.5 K/uL   RBC 3.86 (*) 3.87 - 5.11 MIL/uL   Hemoglobin 11.6 (*) 12.0 - 15.0 g/dL   HCT 36.0  36.0 - 46.0 %   MCV 93.3  78.0 - 100.0 fL   MCH 30.1  26.0 - 34.0 pg   MCHC 32.2  30.0 - 36.0 g/dL   RDW 13.6  11.5 - 15.5 %   Platelets 232  150 - 400 K/uL  GLUCOSE, CAPILLARY     Status: Abnormal   Collection Time    02/23/14  3:46 AM      Result Value Ref Range   Glucose-Capillary 174 (*) 70 - 99 mg/dL   Comment 1 Documented in Chart     Comment 2 Notify RN    GLUCOSE, CAPILLARY     Status: Abnormal   Collection Time    02/23/14  8:00 AM      Result Value Ref Range   Glucose-Capillary 245 (*) 70 - 99 mg/dL   No results found.     Medical Problem List and Plan: 1. Functional deficits secondary to T10 fracture with complete paraplegia after a fall. Status post T8-T12 fusion laminectomy decompression 2.  DVT Prophylaxis/Anticoagulation: Subcutaneous heparin for DVT prophylaxis. Check vascular study 3. Pain Management: Hydrocodone and Robaxin as needed. Monitor with increased mobility 4. Mood/anxiety: Xanax as needed. Provide emotional support 5. Neuropsych: This patient is capable of making decisions on her own behalf. 6. Skin/Wound Care: Routine skin checks of surgical site and pressure areas 7. Hypertension/atrial fibrillation. Presently on Cardizem 60 mg 4 times a day. Cardiac rate control. A chest pain or shortness of breath.  8. Diabetes mellitus with peripheral neuropathy. Hemoglobin A1c 7.9. Presently with sliding scale insulin. Check blood sugars a.c. and at bedtime. Patient on Glucophage 500 mg twice a day prior to admission reason tolerated. 9. Neurogenic bowel and bladder: timed bowel program, foley for now---consider voiding trial but given severity of injury, her prognosis for return is poor.    Post  Admission Physician Evaluation: 1. Functional deficits secondary  to T10 fracture with subsequent myelopathy and complete SCI---T11/12 paraplegia. 2. Patient is admitted to receive collaborative, interdisciplinary care between the physiatrist, rehab nursing staff, and therapy team. 3. Patient's level of medical complexity and substantial therapy needs in context of that medical necessity cannot be provided at a lesser intensity of care such as a SNF. 4. Patient has experienced substantial functional loss from his/her baseline which was documented above under the "Functional History" and "Functional Status" headings.  Judging by the patient's diagnosis, physical exam, and functional history, the patient has potential for functional progress  which will result in measurable gains while on inpatient rehab.  These gains will be of substantial and practical use upon discharge  in facilitating mobility and self-care at the household level. 5. Physiatrist will provide 24 hour management of medical needs as well as oversight of the therapy plan/treatment and provide guidance as appropriate regarding the interaction of the two. 6. 24 hour rehab nursing will assist with bladder management, bowel management, safety, skin/wound care, disease management, medication administration, pain management and patient education  and help integrate therapy concepts, techniques,education, etc. 7. PT will assess and treat for/with: Lower extremity strength, range of motion, stamina, balance, functional mobility, safety, adaptive techniques and equipment, NMR, ego support, SCI ed, family ed.   Goals are: min to mod assist. 8. OT will assess and treat for/with: ADL's, functional mobility, safety, upper extremity strength, adaptive techniques and equipment, NMR, ego support, community reintegration, sci ed, family ed.   Goals are: min to mod assist. Therapy may not proceed with showering this patient. 9. SLP will assess and treat  for/with: n/a.  Goals are: n/a. 10. Case Management and Social Worker will assess and treat for psychological issues and discharge planning. 11. Team conference will be held weekly to assess progress toward goals and to determine barriers to discharge. 12. Patient will receive at least 3 hours of therapy per day at least 5 days per week. 13. ELOS: 20-28 days       14. Prognosis:  good and fair     Meredith Staggers, MD, [facility]   02/23/2014

## 2014-02-23 NOTE — PMR Pre-admission (Signed)
PMR Admission Coordinator Pre-Admission Assessment  Patient: Yolanda Solis is an 80 y.o., female MRN: [id number] DOB: 1933/07/22 Height: 5\' 4"  (162.6 cm) Weight: 94.7 kg (208 lb 12.4 oz)              Insurance Information HMO: yes    PPO:      PCP:      IPA:      80/20:      OTHER: medicare replacement policy PRIMARY: Bubba Hales      Policy#: [id number]      Subscriber: pt CM Name: Yolanda C.      Phone#: [phone]     Fax#: [phone] Pre-Cert#: [id number]    F/u with onsite CM Geryl Rankins at [phone]  Employer: retired Benefits:  Phone #: [phone]     Name: 8/28 Eff. Date: 06/24/13     Deduct: none      Out of Pocket Max: $4900      Life Max: none CIR: $345 per day daus 1-5      SNF: no copay days 1-20; $155 per day days 21-52; no copay days 53-100 Outpatient: $40 copay per visit     Co-Pay: no visit limit Home Health: 100%      Co-Pay: none DME: 80%     Co-Pay: 20% Providers: pt choice  Medicaid Application Date:       Case Manager:  Disability Application Date:       Case Worker:   Emergency Contact Information Contact Information   Name Relation Home Work [name] [phone]  [phone]   [name],[name] Sister [phone]       Current Medical History  Patient Admitting Diagnosis: T10 fx with complete paraplegia  History of Present Illness: Yolanda Solis is a 80 y.o. right-handed female with history of hypertension as well as diabetes mellitus. Patient independent prior to admission living alone. She does not drive.   Admitted 02/18/2014 after unwitnessed fall at home complaining of weakness to bilateral lower extremities. She denied loss of consciousness. X-rays and imaging revealed thoracic fracture T10 fracture dislocation Chance fracture with mild cord compression as well as cord edema. Underwent thoracolumbar repair T8-T12 fusion with laminectomy and decompression of spinal cord with posterior lateral arthrodesis  02/18/2014 per Dr. Vertell Limber. Hospital course EKG new onset atrial fibrillation maintained on Cardizem. Aspirin lumbar fusion brace in place.  Past Medical History  Past Medical History  Diagnosis Date  . Diabetes mellitus   . Hypertension   . Arthritis     Family History  family history is not on file.  Prior Rehab/Hospitalizations: none  Current Medications  Current facility-administered medications:0.9 %  sodium chloride infusion, 250 mL, Intravenous, Continuous, Erline Levine, MD;  acetaminophen (TYLENOL) suppository 650 mg, 650 mg, Rectal, Q4H PRN, Erline Levine, MD;  acetaminophen (TYLENOL) tablet 650 mg, 650 mg, Oral, Q4H PRN, Erline Levine, MD;  alum & mag hydroxide-simeth (MAALOX/MYLANTA) 200-200-20 MG/5ML suspension 30 mL, 30 mL, Oral, Q6H PRN, Erline Levine, MD antiseptic oral rinse (CPC / CETYLPYRIDINIUM CHLORIDE 0.05%) solution 7 mL, 7 mL, Mouth Rinse, BID, Erline Levine, MD, 7 mL at 02/23/14 1004;  bisacodyl (DULCOLAX) suppository 10 mg, 10 mg, Rectal, Daily PRN, Erline Levine, MD, 10 mg at 02/23/14 1002;  diazepam (VALIUM) tablet 1 mg, 1 mg, Oral, Q8H PRN, Rush Farmer, MD;  diltiazem (CARDIZEM) tablet 60 mg, 60 mg, Oral, 4 times per day, Rush Farmer, MD, 60 mg at 02/23/14 1235 docusate sodium (COLACE) capsule 100 mg,  100 mg, Oral, BID, Erline Levine, MD, 100 mg at 02/23/14 1000;  heparin injection 5,000 Units, 5,000 Units, Subcutaneous, 3 times per day, Rush Farmer, MD, 5,000 Units at 02/23/14 [phone];  HYDROcodone-acetaminophen (NORCO/VICODIN) 5-325 MG per tablet 1-2 tablet, 1-2 tablet, Oral, Q4H PRN, Erline Levine, MD, 1 tablet at 02/22/14 1446 insulin aspart (novoLOG) injection 2-6 Units, 2-6 Units, Subcutaneous, 6 times per day, Vilinda Boehringer, MD, 6 Units at 02/23/14 1235;  insulin regular (NOVOLIN R,HUMULIN R) 250 Units in sodium chloride 0.9 % 250 mL (1 Units/mL) infusion, , Intravenous, Continuous, Rush Farmer, MD, 2.3 Units/hr at 02/18/14 2312;  loratadine (CLARITIN) tablet 10  mg, 10 mg, Oral, Daily, Erline Levine, MD, 10 mg at 02/23/14 1002 menthol-cetylpyridinium (CEPACOL) lozenge 3 mg, 1 lozenge, Oral, PRN, Erline Levine, MD;  morphine 2 MG/ML injection 1-4 mg, 1-4 mg, Intravenous, Q3H PRN, Erline Levine, MD, 2 mg at 02/18/14 0351;  ondansetron [facility]) injection 4 mg, 4 mg, Intravenous, Q4H PRN, Erline Levine, MD;  oxyCODONE-acetaminophen (PERCOCET/ROXICET) 5-325 MG per tablet 1-2 tablet, 1-2 tablet, Oral, Q4H PRN, Erline Levine, MD, 2 tablet at 02/23/14 0956 oxyCODONE-acetaminophen (PERCOCET/ROXICET) 5-325 MG per tablet 1-2 tablet, 1-2 tablet, Oral, Q4H PRN, Erline Levine, MD;  pantoprazole (PROTONIX) EC tablet 40 mg, 40 mg, Oral, Q1200, Erline Levine, MD, 40 mg at 02/23/14 1235;  phenol (CHLORASEPTIC) mouth spray 1 spray, 1 spray, Mouth/Throat, PRN, Erline Levine, MD;  polyethylene glycol (MIRALAX / GLYCOLAX) packet 17 g, 17 g, Oral, Daily PRN, Erline Levine, MD, 17 g at 02/22/14 2332 senna (SENOKOT) tablet 8.6 mg, 1 tablet, Oral, BID, Erline Levine, MD, 8.6 mg at 02/23/14 0956;  sodium chloride 0.9 % injection 3 mL, 3 mL, Intravenous, Q12H, Erline Levine, MD, 3 mL at 02/23/14 1003;  sodium chloride 0.9 % injection 3 mL, 3 mL, Intravenous, PRN, Erline Levine, MD;  zolpidem (AMBIEN) tablet 5 mg, 5 mg, Oral, QHS PRN, Erline Levine, MD  Patients Current Diet: Carb Control  Precautions / Restrictions Precautions Precautions: Back Spinal Brace: Thoracolumbosacral orthotic;Applied in supine position Restrictions Weight Bearing Restrictions: No   Prior Activity Level Limited Community (1-2x/wk): Mod I with walker pta. does not drive. Lived alone Has been widowed for 37 years, independent and self sufficient Sons drive her.  Home Assistive Devices / Equipment Home Assistive Devices/Equipment: None  Prior Functional Level Prior Function Level of Independence: Independent  Current Functional Level Cognition  Overall Cognitive Status: Within Functional Limits for tasks  assessed Orientation Level: Oriented X4    Extremity Assessment (includes Sensation/Coordination)          ADLs  Overall ADL's : Needs assistance/impaired Eating/Feeding: Set up;Sitting Grooming: Set up;Sitting Upper Body Bathing: Minimal assitance;Sitting Lower Body Bathing: Total assistance;Bed level Upper Body Dressing : Moderate assistance;Sitting Upper Body Dressing Details (indicate cue type and reason): total A to donn/doff TLSO in supine Lower Body Dressing: Total assistance;Bed level Toilet Transfer: +2 for safety/equipment;Total assistance (sky lift used) Toileting- Clothing Manipulation and Hygiene: Total assistance Toileting - Clothing Manipulation Details (indicate cue type and reason): foley Functional mobility during ADLs: +2 for physical assistance;Total assistance    Mobility  Overal bed mobility: Needs Assistance Bed Mobility: Rolling Rolling: Max assist;+2 for physical assistance Sidelying to sit: Total assist;+2 for physical assistance Sit to sidelying: Total assist;+2 for physical assistance General bed mobility comments: maximal directional v/c's, pt attempted to assist with UEs    Transfers  Overall transfer level: Needs assistance General transfer comment: maxi sky back to bed, PRAFO boots applied. Compression  wraps removed, SCDs donned    Ambulation / Gait / Stairs / Wheelchair Mobility       Posture / Balance Dynamic Sitting Balance Sitting balance - Comments: pt did tolerate sitting EOB x 15 min. pt remains to have posterior lean however pt able to maintain sitting balance with min guard x 30 sec while holding onto PTs forearm. Pt overall with intermittent support of min to max to maintain EOB balance. Pt BP at 134/74 without ace wraps but did educated RN on benefit of compression ted hose    Special needs/care consideration Bowel mgmt: as of 9/2, no BM since admit Bladder mgmt:foley Diabetic mgmt yes   Previous Home Environment Living  Arrangements: Alone  Lives With: Alone Available Help at Discharge: Available 24 hours/day;Family Type of Home: House Home Layout: One level Home Access: Stairs to enter [employer] of Steps: 4 to 5 steps Bathroom Shower/Tub: Chiropodist: Standard Bathroom Accessibility: Yes How Accessible: Accessible via walker [place]: No  Discharge Living Setting Plans for Discharge Living Setting: Lives with (comment);Other (Comment) (Plans to go live with son, Jori Moll) Type of Home at Discharge: House Discharge Home Layout: One level Discharge Home Access: Stairs to enter Entrance Stairs-Number of Steps: level entry Discharge Bathroom Shower/Tub: Tub/shower unit Discharge Bathroom Toilet: Standard Discharge Bathroom Accessibility: Yes How Accessible: Accessible via walker Does the patient have any problems obtaining your medications?: No  Son plans for pt to live in his home for more wheelchair accessible. Family to assist in her care. They are aware they will ned to be involved in family education prior to d/c home.  Caregivers are: Melisse Caetano, son [phone] Latanya Presser, son's fiance and CNA at [facility] [facility] [phone] Gerald Stabs and Ardelle Lesches, grandson and his wife; EMT and RN [phone] Nicole Kindred and Vonzell Schlatter, grandson and his wife, wife is a CNA [phone] Horton Chin and her sister Ladoris Gene, pt's cousins and CNAs [phone] I have told Jori Moll that caregiver education will be arranged for her prior to d/c home    Social/Family/Support Systems Patient Roles: Parent Contact Information: Manon Hilding, son  Anticipated Caregiver: son and his fiance, grandsons, cousins, etc Anticipated Caregiver's Contact Information: Manon Hilding, son cell [phone] Ability/Limitations of Caregiver: son works days. Family pulling together 24/7 physical care at home Caregiver Availability: 24/7 Discharge Plan Discussed with Primary Caregiver: Yes Is  Caregiver In Agreement with Plan?: Yes Does Caregiver/Family have Issues with Lodging/Transportation while Pt is in Rehab?: No  Goals/Additional Needs Patient/Family Goal for Rehab: MIn to mod assist PT and OT at wheelchair level Expected length of stay: ELOS 20 to 30 days Equipment Needs: using maxisky on acute to get pt up to chair; son working on small ramp area at his home for pt Additional Information: Pt and son need very basic explanations of goals and expectations of her diagnosis. They have been told pt unlikely to ever walk again but they do not want to give up hope. I have told them she will go home in a wheelchair and need assist with all adls which incude a bowel and bladder program, etc. Pt/Family Agrees to Admission and willing to participate: Yes Program Orientation Provided & Reviewed with Pt/Caregiver Including Roles  & Responsibilities: Yes  Decrease burden of Care through IP rehab admission: n/a  Possible need for SNF placement upon discharge:I discussed with pt and Jori Moll that the goal is for d/c home with family educated on how to care for her at wheelchair level.  Patient Condition: This patient's medical and functional status has changed since the consult dated: 02/19/2014 in which the Rehabilitation Physician determined and documented that the patient's condition is appropriate for intensive rehabilitative care in an inpatient rehabilitation facility. See "History of Present Illness" (above) for medical update. Functional changes are: overall total assist. Patient's medical and functional status update has been discussed with the Rehabilitation physician and patient remains appropriate for inpatient rehabilitation. Will admit to inpatient rehab today.  Preadmission Screen Completed By:  Cleatrice Burke, 02/23/2014 1:26 PM ______________________________________________________________________   Discussed status with Dr. Naaman Plummer on 02/23/2014 at  1326 and received  telephone approval for admission today.  Admission Coordinator:  Katreena, Schupp, time 8338 Date 02/23/2014.

## 2014-02-23 NOTE — Progress Notes (Addendum)
Inpatient Diabetes Program Recommendations  AACE/ADA: New Consensus Statement on Inpatient Glycemic Control (2013)  Target Ranges:  Prepandial:   less than 140 mg/dL      Peak postprandial:   less than 180 mg/dL (1-2 hours)      Critically ill patients:  140 - 180 mg/dL    Results for HAZLEY, DEZEEUW (MRN [id number]) as of 02/23/2014 09:36  Ref. Range 02/22/2014 00:04 02/22/2014 04:22 02/22/2014 08:57 02/22/2014 09:01 02/22/2014 12:22 02/22/2014 16:46 02/22/2014 19:41  Glucose-Capillary Latest Range: 70-99 mg/dL 213 (H) 185 (H) 186 (H) 165 (H) 207 (H) 202 (H) 229 (H)    Results for HONG, TIMM (MRN [id number]) as of 02/23/2014 09:36  Ref. Range 02/23/2014 00:01 02/23/2014 03:46 02/23/2014 08:00  Glucose-Capillary Latest Range: 70-99 mg/dL 233 (H) 174 (H) 245 (H)    Diabetes history: Type 2 diabetes   Outpatient Diabetes medications: Metformin 500 mg bid   Current orders for Inpatient glycemic control:  Novolog correction 2-4-6 q 4 hours (ICU Glycemic control protocol)     MD- Please consider the following: 1. D/C ICU Glycemic Control Protocol 2. Start Novolog Moderate SSI tid with meals and HS.  3. Please add Lantus 15 units daily as basal insulin while Metformin is on hold. Based on A1C patient will likely need changes in home diabetes regimen to improve glycemic control.    Will follow Wyn Quaker RN, MSN, CDE Diabetes Coordinator Inpatient Diabetes Program Team Pager: [phone] (8a-10p)

## 2014-02-23 NOTE — ED Provider Notes (Signed)
CSN: [id number]     Arrival date & time 02/16/14  1114 History   First MD Initiated Contact with Patient 02/16/14 1307     Chief Complaint  Patient presents with  . Back Pain     (Consider location/radiation/quality/duration/timing/severity/associated sxs/prior Treatment) HPI  79yF with lower back pain. Lost balance and fell backwards. Happened shortly before arrival. Struck lower back and persistent pain since. Doesn't think hit head. No LOC. Denies HA. No neck pain. No numbness, tingling or loss of strength. No bladder/bowel incontinence or retention. No blood thinners. Has been ambulatory since fall but with increased pain.   Past Medical History  Diagnosis Date  . Diabetes mellitus   . Hypertension   . Arthritis    Past Surgical History  Procedure Laterality Date  . Abdominal hysterectomy    . Posterior lumbar fusion 4 level N/A 02/17/2014    Procedure: Thoracolumbar Repair;  Surgeon: Erline Levine, MD;  Location: [facility];  Service: Neurosurgery;  Laterality: N/A;  Thoracolumbar Repair T8 - T12   No family history on file. History  Substance Use Topics  . Smoking status: Never Smoker   . Smokeless tobacco: Not on file  . Alcohol Use: No   OB History   Grav Para Term Preterm Abortions TAB SAB Ect Mult Living                 Review of Systems  All systems reviewed and negative, other than as noted in HPI.   Allergies  Review of patient's allergies indicates no known allergies.  Home Medications   Prior to Admission medications   Medication Sig Start Date End Date Taking? Authorizing Provider  ibuprofen (ADVIL,MOTRIN) 200 MG tablet Take 400 mg by mouth at bedtime.    Yes Historical Provider, MD  lisinopril (PRINIVIL,ZESTRIL) 5 MG tablet Take 5 mg by mouth daily.   Yes Historical Provider, MD  loratadine (ALLERGY RELIEF) 10 MG tablet Take 10 mg by mouth daily.   Yes Historical Provider, MD  metFORMIN (GLUCOPHAGE) 500 MG tablet Take 500 mg by mouth 2 (two) times  daily with a meal.    Yes Historical Provider, MD  diazepam (VALIUM) 5 MG tablet Take 0.5 tablets (2.5 mg total) by mouth every 8 (eight) hours as needed for anxiety. 02/16/14   Virgel Manifold, MD  docusate sodium (COLACE) 100 MG capsule Take 1 capsule (100 mg total) by mouth every 12 (twelve) hours. 02/16/14   Virgel Manifold, MD  oxyCODONE-acetaminophen (PERCOCET/ROXICET) 5-325 MG per tablet Take 1-2 tablets by mouth every 4 (four) hours as needed for severe pain. 02/16/14   Virgel Manifold, MD   BP 160/105  Pulse 98  Temp(Src) 97.6 F (36.4 C) (Oral)  Resp 18  Ht 5' (1.524 m)  Wt 200 lb (90.719 kg)  BMI 39.06 kg/m2  SpO2 95% Physical Exam  Nursing note and vitals reviewed. Constitutional: She is oriented to person, place, and time. She appears well-developed and well-nourished. No distress.  HENT:  Head: Normocephalic and atraumatic.  Eyes: Conjunctivae are normal. Right eye exhibits no discharge. Left eye exhibits no discharge.  Neck: Neck supple.  Cardiovascular: Normal rate, regular rhythm and normal heart sounds.  Exam reveals no gallop and no friction rub.   No murmur heard. Pulmonary/Chest: Effort normal and breath sounds normal. No respiratory distress.  Abdominal: Soft. She exhibits no distension. There is no tenderness.  Musculoskeletal: She exhibits no edema and no tenderness.  Reports upper lumbar region as area of pain, but not  reproducible with palpation. Increased lower back with ROM of either hip but no hip/groin pain. No bony tenderness of extremities. No shortening/malrotation.   Neurological: She is alert and oriented to person, place, and time. She exhibits normal muscle tone.  Strength 5/5 b/l LE. Sensation intact to light touch.   Skin: Skin is warm and dry.  Psychiatric: She has a normal mood and affect. Her behavior is normal. Thought content normal.    ED Course  Procedures (including critical care time) Labs Review Labs Reviewed - No data to display  Imaging  Review No results found.   Dg Lumbar Spine Complete  02/16/2014   CLINICAL DATA:  80 year old female with low back pain after a fall. Initial encounter.  EXAM: LUMBAR SPINE - COMPLETE 4+ VIEW  COMPARISON:  CT Abdomen and Pelvis 12/25/2006.  FINDINGS: Progressed and bulky diffuse endplate spurring throughout the lumbar spine. Normal lumbar segmentation. Chronic vacuum disc phenomena at L4-L5. No lumbar compression fracture identified. Grossly stable and intact visualized lower thoracic levels. Moderate lower lumbar facet hypertrophy. No pars fracture identified. Extensive Aortoiliac calcified atherosclerosis noted. Grossly intact sacral ala and SI joints.  IMPRESSION: 1.  No acute fracture or listhesis identified in the lumbar spine. 2. Diffuse idiopathic skeletal hyperostosis 3. Chronically advanced L4-L5 disc degeneration.   Electronically Signed   By: Lars Pinks M.D.   On: 02/16/2014 14:46      EKG Interpretation None      MDM   Final diagnoses:  Contusion of lower back, initial encounter   79yf with lower back pain after mechanical fall. Imaging neg for acute abnormality. No neuro complaints. Nonfocal neuro exam. Can ambulate although with increased pain. Plan symptomatic tx. Return precautions discussed.     Virgel Manifold, MD 02/23/14 [phone]

## 2014-02-24 ENCOUNTER — Inpatient Hospital Stay (HOSPITAL_COMMUNITY): Payer: Medicare Other | Admitting: Occupational Therapy

## 2014-02-24 ENCOUNTER — Inpatient Hospital Stay (HOSPITAL_COMMUNITY): Payer: Medicare Other | Admitting: Physical Therapy

## 2014-02-24 ENCOUNTER — Inpatient Hospital Stay (HOSPITAL_COMMUNITY): Payer: Medicare Other

## 2014-02-24 LAB — COMPREHENSIVE METABOLIC PANEL
ALT: 7 U/L (ref 0–35)
AST: 23 U/L (ref 0–37)
Albumin: 2.4 g/dL — ABNORMAL LOW (ref 3.5–5.2)
Alkaline Phosphatase: 138 U/L — ABNORMAL HIGH (ref 39–117)
Anion gap: 15 (ref 5–15)
BUN: 26 mg/dL — ABNORMAL HIGH (ref 6–23)
CALCIUM: 8.6 mg/dL (ref 8.4–10.5)
CO2: 25 meq/L (ref 19–32)
Chloride: 98 mEq/L (ref 96–112)
Creatinine, Ser: 0.9 mg/dL (ref 0.50–1.10)
GFR calc Af Amer: 70 mL/min — ABNORMAL LOW (ref >90)
GFR calc non Af Amer: 60 mL/min — ABNORMAL LOW (ref >90)
Glucose, Bld: 285 mg/dL — ABNORMAL HIGH (ref 70–99)
Potassium: 4.4 mEq/L (ref 3.7–5.3)
SODIUM: 138 meq/L (ref 137–147)
Total Bilirubin: 0.5 mg/dL (ref 0.3–1.2)
Total Protein: 6.1 g/dL (ref 6.0–8.3)

## 2014-02-24 LAB — CBC WITH DIFFERENTIAL/PLATELET
BASOS ABS: 0.1 10*3/uL (ref 0.0–0.1)
Basophils Relative: 1 % (ref 0–1)
Eosinophils Absolute: 0.8 10*3/uL — ABNORMAL HIGH (ref 0.0–0.7)
Eosinophils Relative: 5 % (ref 0–5)
HCT: 37.2 % (ref 36.0–46.0)
Hemoglobin: 11.8 g/dL — ABNORMAL LOW (ref 12.0–15.0)
LYMPHS PCT: 9 % — AB (ref 12–46)
Lymphs Abs: 1.4 10*3/uL (ref 0.7–4.0)
MCH: 29.9 pg (ref 26.0–34.0)
MCHC: 31.7 g/dL (ref 30.0–36.0)
MCV: 94.2 fL (ref 78.0–100.0)
Monocytes Absolute: 1.5 10*3/uL — ABNORMAL HIGH (ref 0.1–1.0)
Monocytes Relative: 10 % (ref 3–12)
Neutro Abs: 11.6 10*3/uL — ABNORMAL HIGH (ref 1.7–7.7)
Neutrophils Relative %: 75 % (ref 43–77)
PLATELETS: 279 10*3/uL (ref 150–400)
RBC: 3.95 MIL/uL (ref 3.87–5.11)
RDW: 13.7 % (ref 11.5–15.5)
WBC: 15.3 10*3/uL — AB (ref 4.0–10.5)

## 2014-02-24 LAB — GLUCOSE, CAPILLARY
GLUCOSE-CAPILLARY: 172 mg/dL — AB (ref 70–99)
GLUCOSE-CAPILLARY: 225 mg/dL — AB (ref 70–99)
Glucose-Capillary: 184 mg/dL — ABNORMAL HIGH (ref 70–99)
Glucose-Capillary: 220 mg/dL — ABNORMAL HIGH (ref 70–99)
Glucose-Capillary: 242 mg/dL — ABNORMAL HIGH (ref 70–99)
Glucose-Capillary: 249 mg/dL — ABNORMAL HIGH (ref 70–99)
Glucose-Capillary: 276 mg/dL — ABNORMAL HIGH (ref 70–99)

## 2014-02-24 MED ORDER — BISACODYL 10 MG RE SUPP
10.0000 mg | Freq: Every day | RECTAL | Status: DC
  Administered 2014-02-25 – 2014-03-14 (×15): 10 mg via RECTAL
  Filled 2014-02-24 (×19): qty 1

## 2014-02-24 MED ORDER — POLYETHYLENE GLYCOL 3350 17 G PO PACK
17.0000 g | PACK | Freq: Every day | ORAL | Status: DC
  Administered 2014-02-24 – 2014-02-27 (×4): 17 g via ORAL
  Filled 2014-02-24 (×6): qty 1

## 2014-02-24 MED ORDER — SENNOSIDES-DOCUSATE SODIUM 8.6-50 MG PO TABS
2.0000 | ORAL_TABLET | Freq: Every day | ORAL | Status: DC
  Administered 2014-02-24 – 2014-03-03 (×7): 2 via ORAL
  Filled 2014-02-24 (×8): qty 2

## 2014-02-24 MED ORDER — SODIUM CHLORIDE 0.9 % IV SOLN
INTRAVENOUS | Status: DC
  Filled 2014-02-24: qty 2.5

## 2014-02-24 NOTE — Progress Notes (Signed)
Abdominal film consistent with postoperative ileus. Will change diet to full liquids advance as tolerated. Followup KUB next couple of days. Change MiraLAX to be scheduled daily and continue Senokot S. for now. No present nausea and vomiting reported

## 2014-02-24 NOTE — Progress Notes (Signed)
[facility] PHYSICAL MEDICINE & REHABILITATION     PROGRESS NOTE    Subjective/Complaints: Belly distended. Having generalized back pain. Denies acute distress this morning.  A  review of systems has been performed and if not noted above is otherwise negative.   Objective: Vital Signs: Blood pressure 111/64, pulse 65, temperature 98.1 F (36.7 C), temperature source Oral, resp. rate 18, weight 101.424 kg (223 lb 9.6 oz), SpO2 95.00%. No results found.  Recent Labs  02/23/14 0338 02/23/14 2031  WBC 15.3* 16.6*  HGB 11.6* 11.5*  HCT 36.0 35.5*  PLT 232 234    Recent Labs  02/22/14 0620 02/23/14 2031  NA 136*  --   K 4.2  --   CL 98  --   GLUCOSE 180*  --   BUN 28*  --   CREATININE 0.95 1.13*  CALCIUM 8.6  --    CBG (last 3)   Recent Labs  02/24/14 0014 02/24/14 0402 02/24/14 0743  GLUCAP 172* 184* 225*    Wt Readings from Last 3 Encounters:  02/23/14 101.424 kg (223 lb 9.6 oz)  02/17/14 94.7 kg (208 lb 12.4 oz)  02/17/14 94.7 kg (208 lb 12.4 oz)    Physical Exam:  Constitutional: She is oriented to person, place, and time. She appears well-developed.  HENT: oral mucosa pink/moist  Head: Normocephalic.  Eyes: EOM are normal.  Neck: Normal range of motion. Neck supple. No thyromegaly present.  Cardiovascular: Normal rate and regular rhythm. No murmurs  Respiratory: Effort normal and breath sounds normal. No respiratory distress. No wheezes  GI: Soft. Bowel sounds are normal. She exhibits more distension and high pitched bowel sounds persists.  Skin:  Back incision clean.   Neuro: alert and oriented. Cognitively appears appropriate  T12 sensory level---0/2 sensation below the level 0/5 strength below waist. No resting tone.  UE motor 5/5, UE sensory normal. CN intact. Cognition appropriate  Psych: normal   Assessment/Plan: 1. Functional deficits secondary to T10 fx/dislocation with complete SCI which require 3+ hours per day of interdisciplinary  therapy in a comprehensive inpatient rehab setting. Physiatrist is providing close team supervision and 24 hour management of active medical problems listed below. Physiatrist and rehab team continue to assess barriers to discharge/monitor patient progress toward functional and medical goals. FIM:                                 Medical Problem List and Plan:  1. Functional deficits secondary to T10 fracture with complete paraplegia after a fall. Status post T8-T12 fusion laminectomy decompression  2. DVT Prophylaxis/Anticoagulation: change to lovenox 40q12. Check vascular study today  3. Pain Management: Hydrocodone and Robaxin as needed. Monitor with increased mobility  4. Mood/anxiety: Xanax as needed. Provide emotional support   -will ask neuropsych to see this pt as well 5. Neuropsych: This patient is capable of making decisions on her own behalf.  6. Skin/Wound Care: Routine skin checks of surgical site and pressure areas  7. Hypertension/atrial fibrillation. Presently on Cardizem 60 mg 4 times a day. Cardiac rate control. A chest pain or shortness of breath.  8. Diabetes mellitus with peripheral neuropathy. Hemoglobin A1c 7.9. Presently with sliding scale insulin. Check blood sugars a.c. and at bedtime. Patient on Glucophage 500 mg twice a day prior to admission reason tolerated  -adjust regimen as needed.  9. Neurogenic bowel and bladder: timed bowel program, foley for now---consider voiding trial but given  severity of injury, her prognosis for return is poor.      LOS (Days) 1 A FACE TO FACE EVALUATION WAS PERFORMED  SWARTZ,ZACHARY T 02/24/2014 9:13 AM

## 2014-02-24 NOTE — Interval H&P Note (Signed)
Yolanda Solis was admitted today to Inpatient Rehabilitation with the diagnosis of T10 spinal fx with SCI, paraplegia.  The patient's history has been reviewed, patient examined, and there is no change in status.  Patient continues to be appropriate for intensive inpatient rehabilitation.  I have reviewed the patient's chart and labs.  Questions were answered to the patient's satisfaction.  This encounter and document were completed on 02/23/14. The H&P is now being placed in the rehab encounter for the purpose of charting.   Telly Jawad T 02/24/2014, 6:34 AM

## 2014-02-24 NOTE — Progress Notes (Signed)
Soap suds enema given with no results. Notified Silvestre Mesi, PA with result and no new orders at this time. Will continue to monitor pt throughout shift.

## 2014-02-24 NOTE — H&P (View-Only) (Signed)
Physical Medicine and Rehabilitation Admission H&P    Chief Complaint  Patient presents with  . Numbness  . Leg Pain  : HPI: Yolanda Solis is a 80 y.o. right-handed female with history of hypertension as well as diabetes mellitus. Patient independent prior to admission living alone. She does not drive. Admitted 02/18/2014 after unwitnessed fall at home complaining of weakness to bilateral lower extremities. She denied loss of consciousness. X-rays and imaging revealed thoracic fracture T10 fracture dislocation Chance fracture with mild cord compression as well as cord edema. Underwent thoracolumbar repair T8-T12 fusion with laminectomy and decompression of spinal cord with posterior lateral arthrodesis 02/18/2014 per Dr. Vertell Limber. Hospital course EKG new onset atrial fibrillation maintained on Cardizem. Back brace applied in supine position. Subcutaneous heparin added for DVT prophylaxis. Physical and occupational therapy evaluations pending. M.D. has requested physical medicine rehabilitation consult. Patient was admitted for comprehensive rehabilitation program   Review of Systems  Constitutional: Negative for fever and chills.  HENT: Negative for hearing loss.   Eyes: Negative for blurred vision and double vision.  Gastrointestinal: Positive for constipation. Negative for nausea and vomiting.  Skin: Negative for itching and rash.  Neurological: Positive for tingling, sensory change and focal weakness. Negative for seizures and headaches.  Psychiatric/Behavioral: The patient has insomnia.    Review of Systems  Gastrointestinal: Positive for constipation.  Musculoskeletal: Positive for falls and myalgias.  All other systems reviewed and are negative  Past Medical History  Diagnosis Date  . Diabetes mellitus   . Hypertension   . Arthritis    Past Surgical History  Procedure Laterality Date  . Abdominal hysterectomy    . Posterior lumbar fusion 4 level N/A 02/17/2014   Procedure: Thoracolumbar Repair;  Surgeon: Erline Levine, MD;  Location: [place] [facility];  Service: Neurosurgery;  Laterality: N/A;  Thoracolumbar Repair T8 - T12   No family history on file. Social History:  reports that she has never smoked. She does not have any smokeless tobacco history on file. She reports that she does not drink alcohol or use illicit drugs. Allergies: No Known Allergies Medications Prior to Admission  Medication Sig Dispense Refill  . diazepam (VALIUM) 5 MG tablet Take 0.5 tablets (2.5 mg total) by mouth every 8 (eight) hours as needed for anxiety.  10 tablet  0  . lisinopril (PRINIVIL,ZESTRIL) 5 MG tablet Take 5 mg by mouth daily.      Marland Kitchen loratadine (ALLERGY RELIEF) 10 MG tablet Take 10 mg by mouth daily.      . metFORMIN (GLUCOPHAGE) 500 MG tablet Take 500 mg by mouth 2 (two) times daily with a meal.       . oxyCODONE-acetaminophen (PERCOCET/ROXICET) 5-325 MG per tablet Take 1-2 tablets by mouth every 4 (four) hours as needed for severe pain.  20 tablet  0  . docusate sodium (COLACE) 100 MG capsule Take 1 capsule (100 mg total) by mouth every 12 (twelve) hours.  30 capsule  0  . ibuprofen (ADVIL,MOTRIN) 200 MG tablet Take 400 mg by mouth at bedtime.         Home: Home Living Family/patient expects to be discharged to:: Inpatient rehab Living Arrangements: Alone   Functional History: Prior Function Level of Independence: Independent  Functional Status:  Mobility: Bed Mobility Overal bed mobility: Needs Assistance Bed Mobility: Rolling Rolling: Max assist;+2 for physical assistance Sidelying to sit: Total assist;+2 for physical assistance Sit to sidelying: Total assist;+2 for physical assistance General bed mobility comments: assisted with rolling  to take off brace once returned to bed Transfers Overall transfer level: Needs assistance General transfer comment: maxi sky back to bed, PRAFO boots applied. Compression wraps removed, SCDs donned       ADL: ADL Overall ADL's : Needs assistance/impaired Eating/Feeding: Set up;Sitting Grooming: Set up;Sitting Upper Body Bathing: Minimal assitance;Sitting Lower Body Bathing: Total assistance;Bed level Upper Body Dressing : Moderate assistance;Sitting Upper Body Dressing Details (indicate cue type and reason): total A to donn/doff TLSO in supine Lower Body Dressing: Total assistance;Bed level Toilet Transfer: +2 for safety/equipment;Total assistance (sky lift used) Toileting- Clothing Manipulation and Hygiene: Total assistance Toileting - Clothing Manipulation Details (indicate cue type and reason): foley Functional mobility during ADLs: +2 for physical assistance;Total assistance  Cognition: Cognition Overall Cognitive Status: Within Functional Limits for tasks assessed Orientation Level: Oriented X4 Cognition Arousal/Alertness: Awake/alert Behavior During Therapy: WFL for tasks assessed/performed Overall Cognitive Status: Within Functional Limits for tasks assessed  Physical Exam: Blood pressure 125/54, pulse 86, temperature 98.9 F (37.2 C), temperature source Oral, resp. rate 18, height _0  (1.626 m), weight 94.7 kg (208 lb 12.4 oz), SpO2 94.00%. Physical Exam Constitutional: She is oriented to person, place, and time. She appears well-developed.  HENT: oral mucosa pink/moist Head: Normocephalic.  Eyes: EOM are normal.  Neck: Normal range of motion. Neck supple. No thyromegaly present.  Cardiovascular: Normal rate and regular rhythm. No murmurs Respiratory: Effort normal and breath sounds normal. No respiratory distress. No wheezes GI: Soft. Bowel sounds are normal. She exhibits mild distension and high pitched bowel sounds.    Skin:  Back incision was not examined with brace in place Neuro: alert and oriented. Cognitively appears appropriate T12 sensory level---0/2 sensation below the level 0/5 strength below waist. No resting tone.  UE motor 5/5, UE sensory normal.  CN intact. Cognition appropriate Psych: normal  Results for orders placed during the hospital encounter of 02/17/14 (from the past 48 hour(s))  GLUCOSE, CAPILLARY     Status: Abnormal   Collection Time    02/21/14 11:37 AM      Result Value Ref Range   Glucose-Capillary 259 (*) 70 - 99 mg/dL   Comment 1 Notify RN     Comment 2 Documented in Chart    GLUCOSE, CAPILLARY     Status: Abnormal   Collection Time    02/21/14  3:55 PM      Result Value Ref Range   Glucose-Capillary 166 (*) 70 - 99 mg/dL   Comment 1 Notify RN     Comment 2 Documented in Chart    GLUCOSE, CAPILLARY     Status: Abnormal   Collection Time    02/21/14  7:30 PM      Result Value Ref Range   Glucose-Capillary 215 (*) 70 - 99 mg/dL   Comment 1 Documented in Chart     Comment 2 Notify RN    GLUCOSE, CAPILLARY     Status: Abnormal   Collection Time    02/22/14 12:04 AM      Result Value Ref Range   Glucose-Capillary 213 (*) 70 - 99 mg/dL   Comment 1 Documented in Chart     Comment 2 Notify RN    GLUCOSE, CAPILLARY     Status: Abnormal   Collection Time    02/22/14  4:22 AM      Result Value Ref Range   Glucose-Capillary 185 (*) 70 - 99 mg/dL   Comment 1 Documented in Chart     Comment 2 Notify  RN    CBC     Status: Abnormal   Collection Time    02/22/14  6:20 AM      Result Value Ref Range   WBC 14.1 (*) 4.0 - 10.5 K/uL   RBC 3.99  3.87 - 5.11 MIL/uL   Hemoglobin 11.8 (*) 12.0 - 15.0 g/dL   HCT 37.3  36.0 - 46.0 %   MCV 93.5  78.0 - 100.0 fL   MCH 29.6  26.0 - 34.0 pg   MCHC 31.6  30.0 - 36.0 g/dL   RDW 13.4  11.5 - 15.5 %   Platelets 233  150 - 400 K/uL  BASIC METABOLIC PANEL     Status: Abnormal   Collection Time    02/22/14  6:20 AM      Result Value Ref Range   Sodium 136 (*) 137 - 147 mEq/L   Potassium 4.2  3.7 - 5.3 mEq/L   Chloride 98  96 - 112 mEq/L   CO2 24  19 - 32 mEq/L   Glucose, Bld 180 (*) 70 - 99 mg/dL   BUN 28 (*) 6 - 23 mg/dL   Creatinine, Ser 0.95  0.50 - 1.10 mg/dL    Calcium 8.6  8.4 - 10.5 mg/dL   GFR calc non Af Amer 56 (*) >90 mL/min   GFR calc Af Amer 65 (*) >90 mL/min   Comment: (NOTE)     The eGFR has been calculated using the CKD EPI equation.     This calculation has not been validated in all clinical situations.     eGFR's persistently <90 mL/min signify possible Chronic Kidney     Disease.   Anion gap 14  5 - 15  PHOSPHORUS     Status: None   Collection Time    02/22/14  6:20 AM      Result Value Ref Range   Phosphorus 3.5  2.3 - 4.6 mg/dL  MAGNESIUM     Status: None   Collection Time    02/22/14  6:20 AM      Result Value Ref Range   Magnesium 2.0  1.5 - 2.5 mg/dL  TSH     Status: None   Collection Time    02/22/14  6:30 AM      Result Value Ref Range   TSH 1.730  0.350 - 4.500 uIU/mL  GLUCOSE, CAPILLARY     Status: Abnormal   Collection Time    02/22/14  8:57 AM      Result Value Ref Range   Glucose-Capillary 186 (*) 70 - 99 mg/dL  GLUCOSE, CAPILLARY     Status: Abnormal   Collection Time    02/22/14  9:01 AM      Result Value Ref Range   Glucose-Capillary 165 (*) 70 - 99 mg/dL   Comment 1 Documented in Chart     Comment 2 Notify RN    GLUCOSE, CAPILLARY     Status: Abnormal   Collection Time    02/22/14 12:22 PM      Result Value Ref Range   Glucose-Capillary 207 (*) 70 - 99 mg/dL   Comment 1 Notify RN     Comment 2 Documented in Chart    GLUCOSE, CAPILLARY     Status: Abnormal   Collection Time    02/22/14  4:46 PM      Result Value Ref Range   Glucose-Capillary 202 (*) 70 - 99 mg/dL   Comment 1 Notify RN       Comment 2 Documented in Chart    GLUCOSE, CAPILLARY     Status: Abnormal   Collection Time    02/22/14  7:41 PM      Result Value Ref Range   Glucose-Capillary 229 (*) 70 - 99 mg/dL   Comment 1 Documented in Chart     Comment 2 Notify RN    GLUCOSE, CAPILLARY     Status: Abnormal   Collection Time    02/23/14 12:01 AM      Result Value Ref Range   Glucose-Capillary 233 (*) 70 - 99 mg/dL   Comment 1  Documented in Chart     Comment 2 Notify RN    CBC     Status: Abnormal   Collection Time    02/23/14  3:38 AM      Result Value Ref Range   WBC 15.3 (*) 4.0 - 10.5 K/uL   RBC 3.86 (*) 3.87 - 5.11 MIL/uL   Hemoglobin 11.6 (*) 12.0 - 15.0 g/dL   HCT 36.0  36.0 - 46.0 %   MCV 93.3  78.0 - 100.0 fL   MCH 30.1  26.0 - 34.0 pg   MCHC 32.2  30.0 - 36.0 g/dL   RDW 13.6  11.5 - 15.5 %   Platelets 232  150 - 400 K/uL  GLUCOSE, CAPILLARY     Status: Abnormal   Collection Time    02/23/14  3:46 AM      Result Value Ref Range   Glucose-Capillary 174 (*) 70 - 99 mg/dL   Comment 1 Documented in Chart     Comment 2 Notify RN    GLUCOSE, CAPILLARY     Status: Abnormal   Collection Time    02/23/14  8:00 AM      Result Value Ref Range   Glucose-Capillary 245 (*) 70 - 99 mg/dL   No results found.     Medical Problem List and Plan: 1. Functional deficits secondary to T10 fracture with complete paraplegia after a fall. Status post T8-T12 fusion laminectomy decompression 2.  DVT Prophylaxis/Anticoagulation: Subcutaneous heparin for DVT prophylaxis. Check vascular study 3. Pain Management: Hydrocodone and Robaxin as needed. Monitor with increased mobility 4. Mood/anxiety: Xanax as needed. Provide emotional support 5. Neuropsych: This patient is capable of making decisions on her own behalf. 6. Skin/Wound Care: Routine skin checks of surgical site and pressure areas 7. Hypertension/atrial fibrillation. Presently on Cardizem 60 mg 4 times a day. Cardiac rate control. A chest pain or shortness of breath.  8. Diabetes mellitus with peripheral neuropathy. Hemoglobin A1c 7.9. Presently with sliding scale insulin. Check blood sugars a.c. and at bedtime. Patient on Glucophage 500 mg twice a day prior to admission reason tolerated. 9. Neurogenic bowel and bladder: timed bowel program, foley for now---consider voiding trial but given severity of injury, her prognosis for return is poor.    Post  Admission Physician Evaluation: 1. Functional deficits secondary  to T10 fracture with subsequent myelopathy and complete SCI---T11/12 paraplegia. 2. Patient is admitted to receive collaborative, interdisciplinary care between the physiatrist, rehab nursing staff, and therapy team. 3. Patient's level of medical complexity and substantial therapy needs in context of that medical necessity cannot be provided at a lesser intensity of care such as a SNF. 4. Patient has experienced substantial functional loss from his/her baseline which was documented above under the "Functional History" and "Functional Status" headings.  Judging by the patient's diagnosis, physical exam, and functional history, the patient has potential for functional progress  which will result in measurable gains while on inpatient rehab.  These gains will be of substantial and practical use upon discharge  in facilitating mobility and self-care at the household level. 5. Physiatrist will provide 24 hour management of medical needs as well as oversight of the therapy plan/treatment and provide guidance as appropriate regarding the interaction of the two. 6. 24 hour rehab nursing will assist with bladder management, bowel management, safety, skin/wound care, disease management, medication administration, pain management and patient education  and help integrate therapy concepts, techniques,education, etc. 7. PT will assess and treat for/with: Lower extremity strength, range of motion, stamina, balance, functional mobility, safety, adaptive techniques and equipment, NMR, ego support, SCI ed, family ed.   Goals are: min to mod assist. 8. OT will assess and treat for/with: ADL's, functional mobility, safety, upper extremity strength, adaptive techniques and equipment, NMR, ego support, community reintegration, sci ed, family ed.   Goals are: min to mod assist. Therapy may not proceed with showering this patient. 9. SLP will assess and treat  for/with: n/a.  Goals are: n/a. 10. Case Management and Social Worker will assess and treat for psychological issues and discharge planning. 11. Team conference will be held weekly to assess progress toward goals and to determine barriers to discharge. 12. Patient will receive at least 3 hours of therapy per day at least 5 days per week. 13. ELOS: 20-28 days       14. Prognosis:  good and fair     Meredith Staggers, MD, [facility]   02/23/2014

## 2014-02-24 NOTE — Progress Notes (Signed)
Occupational Therapy Session Note  Patient Details  Name: Yolanda Solis MRN: [id number] Date of Birth: 06-08-1934  Today's Date: 02/24/2014 OT Individual Time: 1100-1200 OT Individual Time Calculation (min): 60 min    Skilled Therapeutic Interventions/Progress Updates:  Engaged in grooming task at sink with setup as pt unable to shift weight forward to turn on water.  Educated on AE and DME as needed for self-care tasks with demonstration of drop arm BSC and parts management to utilize slide board for transfer to [facility], encouraging pt to progress from bed pan to [facility].  Pt very fearful with movement and appeared hesitant of BSC even though therapist just demonstrating on self.  Discussed AE to assist with LB bathing and dressing with demonstration by therapist.  Pt returned to bed via [facility] with focus on forward weight shifting to place Maxi Sky sling and then rolling Rt and Lt in bed to remove sling and as needed when bathing, dressing, and donning/doffing back brace.  Pt required +2 with rolling in bed, 2nd time pt with increased participation with use of bed rails to assist in rolling.  Discussed lack of sensation in BLE and rolling for pressure relief to decrease risk of skin breakdown.    Therapy Documentation Precautions:  Precautions Precautions: Back;Fall Required Braces or Orthoses: Spinal Brace Spinal Brace: Thoracolumbosacral orthotic;Applied in supine position Restrictions Weight Bearing Restrictions: No General: General Chart Reviewed: Yes Family/Caregiver Present: No Vital Signs:   Pain: Pain Assessment Pain Assessment: 0-10 Pain Score: 8  Pain Type: Acute pain Pain Location: Back Pain Orientation: Lower Pain Radiating Towards: stomach Pain Frequency: Intermittent Pain Onset: With Activity Pain Intervention(s): Medication (See eMAR) ADL: ADL ADL Comments: see FIM  See FIM for current functional status  Therapy/Group: Individual Therapy  Simonne Come 02/24/2014, 12:07 PM

## 2014-02-24 NOTE — Evaluation (Signed)
Occupational Therapy Assessment and Plan  Patient Details  Name: Yolanda Solis MRN: [id number] Date of Birth: Sep 15, 1933  OT Diagnosis: paraplegia at level T10 Rehab Potential:  Good  ELOS:  3 weeks   Today's Date: 02/24/2014 OT Individual Time: 1100-1200 OT Individual Time Calculation (min): 60 min     Problem List:  Patient Active Problem List   Diagnosis Date Noted  . SCI (spinal cord injury) 02/23/2014  . Fracture dislocation of thoracic spine 02/18/2014  . HTN (hypertension) 02/18/2014  . Type II or unspecified type diabetes mellitus without mention of complication, uncontrolled 02/18/2014  . Fracture of thoracic spine 02/17/2014    Past Medical History:  Past Medical History  Diagnosis Date  . Diabetes mellitus   . Hypertension   . Arthritis    Past Surgical History:  Past Surgical History  Procedure Laterality Date  . Abdominal hysterectomy    . Posterior lumbar fusion 4 level N/A 02/17/2014    Procedure: Thoracolumbar Repair;  Surgeon: Erline Levine, MD;  Location: [place] [facility];  Service: Neurosurgery;  Laterality: N/A;  Thoracolumbar Repair T8 - T12    Assessment & Plan Clinical Impression: Patient is a 80 y.o. year old female admitted 02/18/2014 after unwitnessed fall at home complaining of weakness to bilateral lower extremities. She denied loss of consciousness. X-rays and imaging revealed thoracic fracture T10 fracture dislocation Chance fracture with mild cord compression as well as cord edema. Underwent thoracolumbar repair T8-T12 fusion with laminectomy and decompression of spinal cord with posterior lateral arthrodesis 02/18/2014 per Dr. Vertell Limber. Hospital course EKG new onset atrial fibrillation maintained on Cardizem. Aspirin lumbar fusion brace in place.  Patient transferred to CIR on 02/23/2014 .    Patient currently requires total assist with basic self-care skills secondary to muscle weakness and muscle paralysis.  Prior to hospitalization, patient could  complete BAD/iADL independenly.  Patient will benefit from skilled intervention to decrease level of assist with basic self-care skills prior to discharge home with care partner.  Anticipate patient will require moderate physical assestance and follow up home health.  OT - End of Session Activity Tolerance: Tolerates 10 - 20 min activity with multiple rests Endurance Deficit: Yes OT Assessment OT Patient demonstrates impairments in the following area(s): Balance;Safety;Sensory;Vision;Endurance;Motor;Pain OT Basic ADL's Functional Problem(s): Eating;Grooming;Bathing;Dressing;Toileting OT Transfers Functional Problem(s): Toilet;Tub/Shower OT Plan OT Intensity: Minimum of 1-2 x/day, 45 to 90 minutes OT Frequency: 5 out of 7 days OT Duration/Estimated Length of Stay: 3 weeks OT Treatment/Interventions: Pain management;Patient/family education;Skin care/wound managment;Therapeutic Activities;Self Care/advanced ADL retraining;Therapeutic Exercise;UE/LE Strength taining/ROM;DME/adaptive equipment instruction;Discharge planning;Functional mobility training;Balance/vestibular training;Wheelchair propulsion/positioning OT Self Feeding Anticipated Outcome(s): Independent OT Basic Self-Care Anticipated Outcome(s): Mod Assist OT Toileting Anticipated Outcome(s): Max Assist OT Bathroom Transfers Anticipated Outcome(s): Mod Assist OT Recommendation Patient destination: Home Follow Up Recommendations: Home health OT Equipment Recommended: To be determined   Skilled Therapeutic Intervention 1:1 OT initial evaluation completed with treatment to emphasize increased intellectual awareness, improved bed mobility, increased pain tolerance, and adapted bathing and dressing skills.   Pt received supine in bed reporting nausea with poor appetite.   Pt required total assist +2 for bathing in bed due to need for assist with bed rolls and LE positioning.   Pt required moderate verbal cues to turn and look and manual  facilitation to reach for bed rails due to poor pain tolerance.    OT Evaluation Precautions/Restrictions  Precautions Precautions: Back;Fall Required Braces or Orthoses: Spinal Brace Spinal Brace: Thoracolumbosacral orthotic;Applied in supine position Restrictions Weight Bearing  Restrictions: No  General Chart Reviewed: Yes Family/Caregiver Present: No  Vital Signs    Pain Pain Assessment Pain Assessment: 0-10 Pain Score: 8  Pain Type: Acute pain Pain Location: Back Pain Orientation: Lower Pain Radiating Towards: stomach Pain Frequency: Intermittent Pain Onset: With Activity Pain Intervention(s): Repositioned  Home Living/Prior Functioning Home Living Available Help at Discharge: Available 24 hours/day;Family Type of Home: House  Lives With: Alone (plans to reside with son s/p d/c) IADL History Homemaking Responsibilities: Yes Meal Prep Responsibility: Primary Laundry Responsibility: Primary Cleaning Responsibility: Primary Bill Paying/Finance Responsibility: Primary Shopping Responsibility: Primary Child Care Responsibility: No Homemaking Comments: assist with community mobility Current License: No Mode of Transportation: Family;Friends Education: 11th grade Occupation: Retired 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 Function Level of Independence: Independent with basic ADLs;Independent with transfers  Able to Take Stairs?: Yes Driving: No Vocation: Retired  ADL ADL ADL Comments: see FIM  Vision/Perception  Vision- History Baseline Vision/History: Wears glasses Wears Glasses: Reading only Patient Visual Report: No change from baseline Vision- Assessment Vision Assessment?: No apparent visual deficits Perception Comments: needs cues to open her eyes during bed mobility and related activities   Cognition Overall Cognitive Status: Within Functional Limits for tasks assessed Arousal/Alertness: Awake/alert Orientation Level:  Oriented X4 Attention: Selective Selective Attention: Appears intact Memory: Appears intact Awareness: Impaired Awareness Impairment: Intellectual impairment Problem Solving: Impaired Problem Solving Impairment: Functional basic Safety/Judgment: Appears intact  Sensation Sensation Light Touch: Appears Intact Stereognosis: Appears Intact Hot/Cold: Appears Intact Proprioception: Appears Intact Additional Comments: Intact at B-UE, impaired B-LE  Coordination Gross Motor Movements are Fluid and Coordinated: Yes Fine Motor Movements are Fluid and Coordinated: Yes  Motor     Mobility  Bed Mobility Bed Mobility: Rolling Right;Rolling Left;Supine to Sit;Sitting - Scoot to Edge of Bed Rolling Right: 1: +2 Total assist Rolling Right: Patient Percentage: 20% Rolling Right Details: Manual facilitation for weight shifting;Verbal cues for safe use of DME/AE;Tactile cues for initiation Rolling Left: 1: +2 Total assist Rolling Left: Patient Percentage: 20% Rolling Left Details: Tactile cues for initiation;Verbal cues for safe use of DME/AE;Manual facilitation for weight shifting Supine to Sit: 1: +1 Total assist Supine to Sit Details: Manual facilitation for weight shifting;Verbal cues for safe use of DME/AE;Tactile cues for initiation Sitting - Scoot to Edge of Bed: 1: +1 Total assist Transfers Transfers: Not assessed   Trunk/Postural Assessment  Cervical Assessment Cervical Assessment: Within Functional Limits Thoracic Assessment Thoracic Assessment: Exceptions to [facility] Lumbar Assessment Lumbar Assessment: Exceptions to [facility] Postural Control Postural Control: Deficits on evaluation   Balance Balance Balance Assessed: Yes Static Sitting Balance Static Sitting - Balance Support: Right upper extremity supported;Left upper extremity supported;Feet unsupported (feet unsupport with bed lowered) Static Sitting - Level of Assistance: 2: Max assist  Extremity/Trunk Assessment RUE  Assessment RUE Assessment: Within Functional Limits LUE Assessment LUE Assessment: Within Functional Limits  FIM:  FIM - Eating Eating Activity: 5: Supervision/cues FIM - Grooming Grooming Steps: Wash, rinse, dry face Grooming: 2: Patient completes 1 of 4 or 2 of 5 steps FIM - Bathing Bathing Steps Patient Completed: Chest;Abdomen Bathing: 1: Two helpers FIM - Upper Body Dressing/Undressing Upper body dressing/undressing: 0: Wears gown/pajamas-no public clothing FIM - Lower Body Dressing/Undressing Lower body dressing/undressing: 0: Wears Interior and spatial designer FIM - Toileting Toileting: 1: Two helpers FIM - IT sales professional Transfer: 1: Supine > Sit: Total A (helper does all/Pt. < 25%);1: Mechanical lift FIM - Air cabin crew Transfers: 1-Mechanical lift FIM - Tub/Shower Transfers Tub/shower  Transfers: 0-Activity did not occur or was simulated   Refer to Care Plan for Long Term Goals  Recommendations for other services: None  Discharge Criteria: Patient will be discharged from OT if patient refuses treatment 3 consecutive times without medical reason, if treatment goals not met, if there is a change in medical status, if patient makes no progress towards goals or if patient is discharged from hospital.  The above assessment, treatment plan, treatment alternatives and goals were discussed and mutually agreed upon: by patient  [facility] 02/24/2014, 3:35 PM

## 2014-02-24 NOTE — Progress Notes (Signed)
Physical Therapy Session Note  Patient Details  Name: Yolanda Solis MRN: [id number] Date of Birth: [birth year]  Today's Date: 02/24/2014 PT Individual Time: 1400-1500 PT Individual Time Calculation (min): 60 min   Short Term Goals: Week 1:  PT Short Term Goal 1 (Week 1): Patient will assist with rolling side to side using bedrails in preparation for supine to sit.  PT Short Term Goal 2 (Week 1): Patient will propell wheelchair 50 feet on level surfaces using bilateral UE's PT Short Term Goal 3 (Week 1): Patient will perform dynamic sitting activity edge of mat x 2 minutes with min assist. PT Short Term Goal 4 (Week 1): Patient will be able to verbally instruct set up for sliding board transfer.  Skilled Therapeutic Interventions/Progress Updates:    Therapeutic Exercise: PT performs PROM to pt's B LEs to hips (all planes), knees, and ankles in sagittal plane x 5 reps each. PT instructs pt to attempt isometric or eccentric contractions with each ROM, but PT does not palpate or visualize any motor activity in the LEs, except possible a trace gluteus maximus contraction, but pt was pushing with arms on handrails and PT cannot be sure it was not movement from that.   Therapeutic Activity: PT instructs pt in log rolling technique, crosses pt's ankles, and uses draw sheet to assist pt in log rolling R and L x 5 reps each, req max A rolling R and req tot A rolling L. Pt req verbal cues to reach arm across body for armrest to assist, as well as repeated cues to keep eyes open so that she can see where her arm is supposed to be reaching. PT completes B calf stretch and B hamstring stretch x 30 seconds each.  PT instructs pt in log rolling R/L req 2 person assist to place sling under person. PT instructs pt to observe 2 person overhead lift transfer bed to w/c with maxi sling, so that she will begin to learn how this transfer occurs and be able to verbally direct her care when being transferred; pt  continued to keep eyes closed during therapy session, citing fear of falling/fear of pain.   Pt is extremely anxious and painful re: all movement/physical therapy. Pt did report that she was able to sense "pressure" in LEs during calf stretch, bilaterally. Pt currently req 2 person assist for all mobility, except log rolling to the R in bed with rail, req max A. Pt tends to keep eyes closed throughout therapy session and req repeated verbal cues to keep eyes open in order to participate and begin learning how to direct her care. Continue per PT POC.   Therapy Documentation Precautions:  Precautions Precautions: Back;Fall Required Braces or Orthoses: Spinal Brace Spinal Brace: Thoracolumbosacral orthotic;Applied in supine position Restrictions Weight Bearing Restrictions: No Pain: Pain Assessment Pain Assessment: 0-10 Pain Score: 4  Pain Type: Surgical pain Pain Location: Back Pain Orientation: Lower Pain Onset: On-going Pain Intervention(s): Rest;Repositioned Multiple Pain Sites: No  See FIM for current functional status  Therapy/Group: Individual Therapy  Leatha Rohner M 02/24/2014, 2:04 PM

## 2014-02-24 NOTE — Progress Notes (Signed)
Patient information reviewed and entered into eRehab system by Ama Mcmaster, RN, CRRN, PPS Coordinator.  Information including medical coding and functional independence measure will be reviewed and updated through discharge.     Per nursing patient was given "Data Collection Information Summary for Patients in Inpatient Rehabilitation Facilities with attached "Privacy Act Statement-Health Care Records" upon admission.  

## 2014-02-24 NOTE — Discharge Instructions (Signed)
Inpatient Rehab Discharge Instructions  Yolanda Solis Discharge date and time: No discharge date for patient encounter.   Activities/Precautions/ Functional Status: Activity: activity as tolerated Diet: diabetic diet Wound Care: keep wound clean and dry Functional status:  ___ No restrictions     ___ Walk up steps independently _x__ 24/7 supervision/assistance   ___ Walk up steps with assistance ___ Intermittent supervision/assistance  ___ Bathe/dress independently ___ Walk with walker     ___ Bathe/dress with assistance ___ Walk Independently    ___ Shower independently ___ Walk with assistance    ___ Shower with assistance ___ No alcohol     ___ Return to work/school ________  Special Instructions: Back brace when out of bed   My questions have been answered and I understand these instructions. I will adhere to these goals and the provided educational materials after my discharge from the hospital.  Patient/Caregiver Signature _______________________________ Date __________  Clinician Signature _______________________________________ Date __________  Please bring this form and your medication list with you to all your follow-up doctor's appointments.

## 2014-02-24 NOTE — Evaluation (Signed)
Physical Therapy Assessment and Plan  Patient Details  Name: Yolanda Solis MRN: [id number] Date of Birth: 1934/03/31  PT Diagnosis: Low back pain and Paraplegia Rehab Potential: Good ELOS: 18 - 21 days   Today's Date: 02/24/2014 PT Individual Time: 1000-1100 PT Individual Time Calculation (min): 60 min    Problem List:  Patient Active Problem List   Diagnosis Date Noted  . SCI (spinal cord injury) 02/23/2014  . Fracture dislocation of thoracic spine 02/18/2014  . HTN (hypertension) 02/18/2014  . Type II or unspecified type diabetes mellitus without mention of complication, uncontrolled 02/18/2014  . Fracture of thoracic spine 02/17/2014    Past Medical History:  Past Medical History  Diagnosis Date  . Diabetes mellitus   . Hypertension   . Arthritis    Past Surgical History:  Past Surgical History  Procedure Laterality Date  . Abdominal hysterectomy    . Posterior lumbar fusion 4 level N/A 02/17/2014    Procedure: Thoracolumbar Repair;  Surgeon: Erline Levine, MD;  Location: [place] [facility];  Service: Neurosurgery;  Laterality: N/A;  Thoracolumbar Repair T8 - T12    Assessment & Plan Clinical Impression: [name] is a 80 y.o. right-handed female with history of hypertension as well as diabetes mellitus. Patient independent prior to admission living alone. She does not drive. Admitted 02/18/2014 after unwitnessed fall at home complaining of weakness to bilateral lower extremities. She denied loss of consciousness. X-rays and imaging revealed thoracic fracture T10 fracture dislocation Chance fracture with mild cord compression as well as cord edema. Underwent thoracolumbar repair T8-T12 fusion with laminectomy and decompression of spinal cord with posterior lateral arthrodesis 02/18/2014 per Dr. Vertell Limber. Hospital course EKG new onset atrial fibrillation maintained on Cardizem. Back brace applied in supine position. Subcutaneous heparin added for DVT prophylaxis. Physical and  occupational therapy evaluations pending. M.D. has requested physical medicine rehabilitation consult. Patient transferred to CIR on 02/23/2014 .   Patient currently requires total with mobility secondary to muscle paralysis and decreased sitting balance.  Prior to hospitalization, patient was independent  with mobility and lived with Alone in a House home.  Home access is 1Stairs to enter.  Patient will benefit from skilled PT intervention to maximize safe functional mobility, minimize fall risk and decrease caregiver burden for planned discharge home with 24 hour assist.  Anticipate patient will benefit from follow up [facility] at discharge.  PT - End of Session Activity Tolerance: Tolerates 30+ min activity with multiple rests Endurance Deficit: Yes PT Assessment Rehab Potential: Good Barriers to Discharge: Inaccessible home environment;Decreased caregiver support PT Patient demonstrates impairments in the following area(s): Balance;Pain;Motor;Sensory PT Transfers Functional Problem(s): Bed Mobility;Bed to Chair;Car PT Locomotion Functional Problem(s): Wheelchair Mobility PT Plan PT Intensity: Minimum of 1-2 x/day ,45 to 90 minutes PT Frequency: 5 out of 7 days PT Duration Estimated Length of Stay: 18 - 21 days PT Treatment/Interventions: Discharge planning;Disease management/prevention;Functional mobility training;Pain management;Patient/family education;Therapeutic Activities;Therapeutic Exercise;UE/LE Strength taining/ROM;Wheelchair propulsion/positioning PT Transfers Anticipated Outcome(s): mod assist basic transfers; max assist car PT Locomotion Anticipated Outcome(s): supervision wheelchair mobility controlled and home settings PT Recommendation Follow Up Recommendations: Home health PT Patient destination: Home Equipment Recommended: Sliding board;Wheelchair (measurements);Wheelchair cushion (measurements)  Skilled Therapeutic Intervention Patient sitting in wheelchair upon arrival.  Patient reporting 10/10 pain. Nursing notified and additional meds given. Patient participated in initial evaluation. Patient +2 total assist for sliding board transfer wheelchair to mat. Due to patient's short legs, wooden platform was used under B LE's. Patient able to maintain static  sitting x 15 minutes edge of mat with UE support and LE's supported on wooden step. Wheelchair seating modified with pressure relieving cushion (Jay 3) and foot rests adjusted so that feet are supported. Patient total assist back to wheelchair. Patient propelled wheelchair using bilateral UE's 25 feet with min assist. Patient's arms are also short and patient has difficulty reaching both rims to propel chair. Patient left in wheelchair with quick release belt in place and all items in reach.  PT Evaluation Precautions/Restrictions Precautions Precautions: Fall;Back Required Braces or Orthoses: Spinal Brace Spinal Brace: Thoracolumbosacral orthotic;Applied in supine position Restrictions Weight Bearing Restrictions: No General Chart Reviewed: Yes Family/Caregiver Present: No  Pain Pain Assessment Pain Assessment: 0-10 Pain Score: 10-Worst pain ever Pain Type: Acute pain Pain Location: Back Pain Orientation: Lower Pain Descriptors / Indicators: Aching Pain Frequency: Intermittent Pain Onset: With Activity Pain Intervention(s): Medication (See eMAR);Rest Multiple Pain Sites: No Home Living/Prior Functioning Home Living Available Help at Discharge: Family;Friend(s);Available 24 hours/day Type of Home: House Home Access: Stairs to enter [employer] of Steps: 1 Home Layout: One level  Lives With: Alone Prior Function Level of Independence: Independent with gait  Able to Take Stairs?: Yes Driving: No Vocation: Retired  Associate Professor Overall Cognitive Status: Within Functional Limits for tasks assessed Arousal/Alertness: Awake/alert Orientation Level: Oriented X4 Sensation Sensation Light  Touch: Impaired Detail Light Touch Impaired Details: Absent RLE;Absent LLE Stereognosis: Not tested Hot/Cold: Not tested Proprioception: Not tested Additional Comments: intact B UE; absent B LE Coordination Gross Motor Movements are Fluid and Coordinated: Yes Fine Motor Movements are Fluid and Coordinated: Yes Motor  Motor Motor: Paraplegia  Mobility Transfers Transfers: Yes Lateral/Scoot Transfers: With slide board;1: +2 Total assist Lateral/Scoot Transfer Details (indicate cue type and reason): +2 total assist for sliding board transfers. Patient unable to assist with UE's due to pain and body shape. Locomotion  Ambulation Ambulation: No Gait Gait: No Stairs / Additional Locomotion Stairs: No Architect: Yes Wheelchair Assistance: 4: Energy manager: Both upper extremities Wheelchair Parts Management: Needs assistance Distance: 25  Trunk/Postural Assessment  Cervical Assessment Cervical Assessment: Within Functional Limits Thoracic Assessment Thoracic Assessment: Exceptions to [facility] Lumbar Assessment Lumbar Assessment: Exceptions to [facility] Postural Control Postural Control: Deficits on evaluation  Balance Static Sitting Balance Static Sitting - Balance Support: Feet unsupported;Left upper extremity supported;Right upper extremity supported Static Sitting - Level of Assistance: 5: Stand by assistance Static Sitting - Comment/# of Minutes: patient maintained 15 minutes on edge of mat with LE's supported and bilateral UE support Extremity Assessment  RUE Assessment RUE Assessment: Within Functional Limits (unable to fully MMT due to back pain) LUE Assessment LUE Assessment: Within Functional Limits (unable to fully MMT due to back pain) RLE Assessment RLE Assessment: Exceptions to [facility] (no active movement noted) LLE Assessment LLE Assessment: Exceptions to [facility] (no active movement noted.)  FIM:  FIM - Financial trader Devices: Sliding board Bed/Chair Transfer: 1: Two helpers FIM - Locomotion: Wheelchair Distance: 25 Locomotion: Wheelchair: 1: Travels less than 50 ft with minimal assistance (Pt.>75%) FIM - Locomotion: Ambulation Locomotion: Ambulation: 0: Activity did not occur FIM - Locomotion: Stairs Locomotion: Stairs: 0: Activity did not occur   Refer to Care Plan for Long Term Goals  Recommendations for other services: None  Discharge Criteria: Patient will be discharged from PT if patient refuses treatment 3 consecutive times without medical reason, if treatment goals not met, if there is a change in medical status, if patient makes  no progress towards goals or if patient is discharged from hospital.  The above assessment, treatment plan, treatment alternatives and goals were discussed and mutually agreed upon: by patient  Sanjuana Letters 02/24/2014, 4:23 PM

## 2014-02-25 ENCOUNTER — Inpatient Hospital Stay (HOSPITAL_COMMUNITY): Payer: Medicare Other | Admitting: *Deleted

## 2014-02-25 ENCOUNTER — Inpatient Hospital Stay (HOSPITAL_COMMUNITY): Payer: Medicare Other | Admitting: Occupational Therapy

## 2014-02-25 DIAGNOSIS — M79609 Pain in unspecified limb: Secondary | ICD-10-CM

## 2014-02-25 DIAGNOSIS — M7989 Other specified soft tissue disorders: Secondary | ICD-10-CM

## 2014-02-25 LAB — GLUCOSE, CAPILLARY
GLUCOSE-CAPILLARY: 295 mg/dL — AB (ref 70–99)
GLUCOSE-CAPILLARY: 346 mg/dL — AB (ref 70–99)
Glucose-Capillary: 238 mg/dL — ABNORMAL HIGH (ref 70–99)
Glucose-Capillary: 213 mg/dL — ABNORMAL HIGH (ref 70–99)
Glucose-Capillary: 245 mg/dL — ABNORMAL HIGH (ref 70–99)

## 2014-02-25 MED ORDER — METFORMIN HCL 500 MG PO TABS
500.0000 mg | ORAL_TABLET | Freq: Every day | ORAL | Status: DC
  Administered 2014-02-26 – 2014-02-27 (×2): 500 mg via ORAL
  Filled 2014-02-25 (×5): qty 1

## 2014-02-25 MED ORDER — INSULIN ASPART 100 UNIT/ML ~~LOC~~ SOLN
0.0000 [IU] | Freq: Three times a day (TID) | SUBCUTANEOUS | Status: DC
  Administered 2014-02-25: 11 [IU] via SUBCUTANEOUS
  Administered 2014-02-26 (×2): 5 [IU] via SUBCUTANEOUS
  Administered 2014-02-26: 3 [IU] via SUBCUTANEOUS
  Administered 2014-02-27: 8 [IU] via SUBCUTANEOUS
  Administered 2014-02-27: 5 [IU] via SUBCUTANEOUS
  Administered 2014-02-27 – 2014-02-28 (×2): 8 [IU] via SUBCUTANEOUS
  Administered 2014-02-28: 15 [IU] via SUBCUTANEOUS
  Administered 2014-02-28: 5 [IU] via SUBCUTANEOUS
  Administered 2014-03-01: 8 [IU] via SUBCUTANEOUS
  Administered 2014-03-01: 3 [IU] via SUBCUTANEOUS
  Administered 2014-03-01: 11 [IU] via SUBCUTANEOUS
  Administered 2014-03-02: 5 [IU] via SUBCUTANEOUS
  Administered 2014-03-02: 3 [IU] via SUBCUTANEOUS
  Administered 2014-03-02: 5 [IU] via SUBCUTANEOUS
  Administered 2014-03-03 (×2): 3 [IU] via SUBCUTANEOUS
  Administered 2014-03-03 – 2014-03-04 (×2): 5 [IU] via SUBCUTANEOUS
  Administered 2014-03-04: 3 [IU] via SUBCUTANEOUS
  Administered 2014-03-04: 5 [IU] via SUBCUTANEOUS
  Administered 2014-03-05: 8 [IU] via SUBCUTANEOUS
  Administered 2014-03-05 – 2014-03-07 (×6): 3 [IU] via SUBCUTANEOUS
  Administered 2014-03-07: 2 [IU] via SUBCUTANEOUS
  Administered 2014-03-07: 5 [IU] via SUBCUTANEOUS
  Administered 2014-03-08 – 2014-03-10 (×8): 3 [IU] via SUBCUTANEOUS
  Administered 2014-03-10: 2 [IU] via SUBCUTANEOUS
  Administered 2014-03-11 – 2014-03-12 (×5): 3 [IU] via SUBCUTANEOUS
  Administered 2014-03-12: 2 [IU] via SUBCUTANEOUS
  Administered 2014-03-13: 3 [IU] via SUBCUTANEOUS
  Administered 2014-03-13: 2 [IU] via SUBCUTANEOUS
  Administered 2014-03-13 – 2014-03-14 (×3): 3 [IU] via SUBCUTANEOUS

## 2014-02-25 MED ORDER — METOCLOPRAMIDE HCL 5 MG/ML IJ SOLN
10.0000 mg | Freq: Four times a day (QID) | INTRAMUSCULAR | Status: DC
  Administered 2014-02-25 – 2014-02-27 (×8): 10 mg via INTRAVENOUS
  Filled 2014-02-25 (×12): qty 2

## 2014-02-25 MED ORDER — SODIUM CHLORIDE 0.45 % IV SOLN
INTRAVENOUS | Status: DC
  Administered 2014-02-25 – 2014-02-27 (×3): via INTRAVENOUS

## 2014-02-25 MED ORDER — METFORMIN HCL 500 MG PO TABS: 500.0000 mg | ORAL_TABLET | Freq: Every day | ORAL | Status: DC

## 2014-02-25 MED ORDER — SORBITOL 70 % SOLN
960.0000 mL | TOPICAL_OIL | Freq: Once | ORAL | Status: AC
  Administered 2014-02-25: 960 mL via RECTAL
  Filled 2014-02-25: qty 240

## 2014-02-25 MED ORDER — INSULIN ASPART 100 UNIT/ML ~~LOC~~ SOLN
0.0000 [IU] | Freq: Every day | SUBCUTANEOUS | Status: DC
  Administered 2014-02-25: 2 [IU] via SUBCUTANEOUS
  Administered 2014-02-26: 3 [IU] via SUBCUTANEOUS
  Administered 2014-02-28 – 2014-03-01 (×2): 2 [IU] via SUBCUTANEOUS

## 2014-02-25 MED ORDER — METFORMIN HCL 500 MG PO TABS: 500.0000 mg | ORAL_TABLET | Freq: Two times a day (BID) | ORAL | Status: DC

## 2014-02-25 NOTE — Progress Notes (Signed)
Chart and note reviewed. Agree with note.  Margorie Renner, RD, LDN, CNSC Pager [phone] After Hours Pager [phone]   

## 2014-02-25 NOTE — Progress Notes (Signed)
[facility] Individual Statement of Services  Patient Name:  Yolanda Solis  Date:  02/25/2014  Welcome to the [facility].  Our goal is to provide you with an individualized program based on your diagnosis and situation, designed to meet your specific needs.  With this comprehensive rehabilitation program, you will be expected to participate in at least 3 hours of rehabilitation therapies Monday-Friday, with modified therapy programming on the weekends.  Your rehabilitation program will include the following services:  Physical Therapy (PT), Occupational Therapy (OT), 24 hour per day rehabilitation nursing, Case Management (Social Worker), Rehabilitation Medicine, Nutrition Services and Pharmacy Services  Weekly team conferences will be held on Tuesdays to discuss your progress.  Your Social Worker will talk with you frequently to get your input and to update you on team discussions.  Team conferences with you and your family in attendance may also be held.  Expected length of stay: 3 weeks  Overall anticipated outcome: moderate assistance  Depending on your progress and recovery, your program may change. Your Social Worker will coordinate services and will keep you informed of any changes. Your Social Worker's name and contact numbers are listed  below.  The following services may also be recommended but are not provided by the [facility]:  [place] will be made to provide these services after discharge if needed.  Arrangements include referral to agencies that provide these services.  Your insurance has been verified to be:  [employer] Medicare Complete Your primary doctor is:  Dr. Elsie Lincoln  Pertinent information will be shared with your doctor and your insurance company.  Social Worker:  Alfonse Alpers, LCSW  [phone] or ([phone]  Information discussed with and copy given to patient by: Trey Sailors, 02/25/2014, 12:51 AM

## 2014-02-25 NOTE — Progress Notes (Signed)
Occupational Therapy Session Note  Patient Details  Name: Yolanda Solis MRN: [id number] Date of Birth: [birth year]  Today's Date: 02/25/2014 OT Individual Time: 1448-1856 OT Individual Time Calculation (min): 60 min    Short Term Goals: Week 1:  OT Short Term Goal 1 (Week 1): Pt will complete bed rolls with mod assist to manage legs during assisted BADL OT Short Term Goal 2 (Week 1): Pt will demo ability to wash upper body with min assist for thoroughness while supine in bed OT Short Term Goal 3 (Week 1): Pt will demo abiltiy to perform lateral leans for toilet hygiene with mod assist to manage legs OT Short Term Goal 4 (Week 1): Pt will demo ability to don upper body clothing with HOB elevated and mod assist OT Short Term Goal 5 (Week 1): Pt will perform 4 of 5 grooming tasks at w/c level with setup  Skilled Therapeutic Interventions/Progress Updates: ADL-retraining with emphasis on improved bed mobility, upper body bathing and dressing skills, improved activity tolerance, weight-sifting, and improved active participation with role in directing care/caregivers.   Pt received supine in bed and required +2 assist for lower body bathing/dressing due to BLE weakness.  Pt requires facilitation at hips/shoulders to turn and reach for bed rails but demo'd improved pain tolerance this session and was able to verbalize several steps in multi-step assist process.   Pt fitted with TLSO while supine however orthotics may need adjustments for improved fit; plan to discuss orthotics issue physical therapist/rehab team.   Pt continues to require mech lift transfer howevershe  reports static sitting in w/c for extended periods as being tolerable.  Pt left in w/c with safety belt applied and call light accessible while awaiting physical therapist.     Therapy Documentation Precautions:  Precautions Precautions: Fall;Back Required Braces or Orthoses: Spinal Brace Spinal Brace: Thoracolumbosacral  orthotic;Applied in supine position Restrictions Weight Bearing Restrictions: No  Pain: Pain Assessment Pain Score: No/denies pain  ADL: ADL ADL Comments: see FIM  See FIM for current functional status  Therapy/Group: Individual Therapy  Second session: Time: 1300-1400 Time Calculation (min):  60 min  Pain Assessment: No/denies pain  Skilled Therapeutic Interventions: ADL-retraining with emphasis on improved static sitting balance, dynamic sitting balance, grooming at sink, and slide board transfer.   Pt received supine in bed, dressed with TLSO on.   With +2 assist pt completed bed mobility, rolling to her left, and rose to sit at edge of bed with max assist.   Pt practiced sitting balance at edge of bed, unsupported, and maintained static balance for approx 2 minutes before deviating to posterior left lateral tilt, failing righting reaction due impaired postural control.   After assist to neutral balance seated, pt then completed dynamic activity (combing her hair) while seated at edge of bed with only standby assist during task.  Pt then completed slilde board transfer to w/c (+2, total) although able to articulate 2 steps with transfer process.   While seated in w/c with mod assist to propel w/c, pt groomed at sink (washing hands & face, brushing gums, combing hair) with supervision and setup.   Pt was escorted to RN station to remain seated while awaiting physical therapy appt later.    See FIM for current functional status  Therapy/Group: Individual Therapy  Marcus 02/25/2014, 12:35 PM

## 2014-02-25 NOTE — Progress Notes (Signed)
[facility] PHYSICAL MEDICINE & REHABILITATION     PROGRESS NOTE    Subjective/Complaints:  minimal results with SSE yesterday.   A  review of systems has been performed and if not noted above is otherwise negative.   Objective: Vital Signs: Blood pressure 142/69, pulse 83, temperature 98.6 F (37 C), temperature source Oral, resp. rate 16, weight 101.424 kg (223 lb 9.6 oz), SpO2 93.00%. Dg Abd 1 View  02/24/2014   CLINICAL DATA:  80 year old female with distended abdomen. Query ileus. Recent spine surgery. Initial encounter.  EXAM: ABDOMEN - 1 VIEW  COMPARISON:  Lumbar radiographs 02/16/2014. [facility] CT Abdomen and Pelvis 12/25/2006.  FINDINGS: Supine view of the abdomen at 1337 hrs. Gas-filled large bowel loops, mildly distended. Gas-filled nondistended small bowel loops mostly in the lower abdomen. Gas is present to the rectum. There is some retained stool in the right colon. New partially visualized thoracolumbar transpedicular hardware.  IMPRESSION: Increased gas-filled bowel loops, more so the large bowel, compatible with postoperative ileus.   Electronically Signed   By: Lars Pinks M.D.   On: 02/24/2014 14:42    Recent Labs  02/23/14 2031 02/24/14 0843  WBC 16.6* 15.3*  HGB 11.5* 11.8*  HCT 35.5* 37.2  PLT 234 279    Recent Labs  02/23/14 2031 02/24/14 0843  NA  --  138  K  --  4.4  CL  --  98  GLUCOSE  --  285*  BUN  --  26*  CREATININE 1.13* 0.90  CALCIUM  --  8.6   CBG (last 3)   Recent Labs  02/24/14 2337 02/25/14 0347 02/25/14 0725  GLUCAP 220* 238* 213*    Wt Readings from Last 3 Encounters:  02/23/14 101.424 kg (223 lb 9.6 oz)  02/17/14 94.7 kg (208 lb 12.4 oz)  02/17/14 94.7 kg (208 lb 12.4 oz)    Physical Exam:  Constitutional: She is oriented to person, place, and time. She appears well-developed.  HENT: oral mucosa pink/moist  Head: Normocephalic.  Eyes: EOM are normal.  Neck: Normal range of motion. Neck supple. No thyromegaly  present.  Cardiovascular: Normal rate and regular rhythm. No murmurs  Respiratory: Effort normal and breath sounds normal. No respiratory distress. No wheezes  GI: Soft. Bowel sounds are normal. She exhibits continued distension and high pitched bowel sounds persists.  Skin:  Back incision clean.   Neuro: alert and oriented. Cognitively appears appropriate  T12 sensory level---0/2 sensation below the level 0/5 strength below waist. No resting tone.  UE motor 5/5, UE sensory normal. CN intact. Cognition appropriate  Psych: normal   Assessment/Plan: 1. Functional deficits secondary to T10 fx/dislocation with complete SCI which require 3+ hours per day of interdisciplinary therapy in a comprehensive inpatient rehab setting. Physiatrist is providing close team supervision and 24 hour management of active medical problems listed below. Physiatrist and rehab team continue to assess barriers to discharge/monitor patient progress toward functional and medical goals. FIM: FIM - Bathing Bathing Steps Patient Completed: Chest;Abdomen Bathing: 1: Two helpers  FIM - Upper Body Dressing/Undressing Upper body dressing/undressing: 0: Wears gown/pajamas-no public clothing FIM - Lower Body Dressing/Undressing Lower body dressing/undressing: 0: Wears Interior and spatial designer  FIM - Toileting Toileting: 1: Two helpers  FIM - Air cabin crew Transfers: 1-Mechanical lift  FIM - Control and instrumentation engineer Devices: Theatre stage manager Transfer: 1: Mechanical lift;1: Two helpers  FIM - Locomotion: Wheelchair Distance: 25 Locomotion: Wheelchair: 0: Activity did not occur FIM - Locomotion:  Ambulation Locomotion: Ambulation: 0: Activity did not occur  Comprehension Comprehension Mode: Auditory Comprehension: 4-Understands basic 75 - 89% of the time/requires cueing 10 - 24% of the time  Expression Expression Mode: Verbal Expression: 5-Expresses basic 90% of  the time/requires cueing < 10% of the time.  Social Interaction Social Interaction: 4-Interacts appropriately 75 - 89% of the time - Needs redirection for appropriate language or to initiate interaction.  Problem Solving Problem Solving: 3-Solves basic 50 - 74% of the time/requires cueing 25 - 49% of the time  Memory Memory: 3-Recognizes or recalls 50 - 74% of the time/requires cueing 25 - 49% of the time Medical Problem List and Plan:  1. Functional deficits secondary to T10 fracture with complete paraplegia after a fall. Status post T8-T12 fusion laminectomy decompression  2. DVT Prophylaxis/Anticoagulation: change to lovenox 40q12. Check vascular study today  3. Pain Management: Hydrocodone and Robaxin as needed. Monitor with increased mobility  4. Mood/anxiety: Xanax as needed. Provide emotional support   -will ask neuropsych to see this pt as well 5. Neuropsych: This patient is capable of making decisions on her own behalf.  6. Skin/Wound Care: Routine skin checks of surgical site and pressure areas  7. Hypertension/atrial fibrillation. Presently on Cardizem 60 mg 4 times a day. Cardiac rate control. A chest pain or shortness of breath.  8. Diabetes mellitus with peripheral neuropathy. Hemoglobin A1c 7.9. Presently with sliding scale insulin. Check blood sugars a.c. and at bedtime. Patient on Glucophage 500 mg twice a day prior to admission reason tolerated  -adjust regimen as needed.  9. Neurogenic bowel and bladder: timed bowel program, foley for now---consider voiding trial but given severity of injury, her prognosis for return is poor.  10. Ileus: SMOG enema  -iv reglan  -liquid diet for now     LOS (Days) 2 A FACE TO FACE EVALUATION WAS PERFORMED  SWARTZ,ZACHARY T 02/25/2014 8:02 AM

## 2014-02-25 NOTE — Progress Notes (Signed)
Patient on full liquid diet at current time due to ileus. Will resume metformin daily as Cr improving and titrate upwards as diet advanced. Change SSI to ac/hs

## 2014-02-25 NOTE — Progress Notes (Signed)
INITIAL NUTRITION ASSESSMENT  DOCUMENTATION CODES Per approved criteria  -Obesity Unspecified   INTERVENTION: Encourage PO intake.  NUTRITION DIAGNOSIS: Increased nutrient needs related to spinal cord injury as evidenced by estimated nutrition needs.   Goal: Pt to meet >/= 90% of their estimated nutrition needs   Monitor:  PO intake, weight trends, labs, I/O's  Reason for Assessment: Low Braden Score  80 y.o. female  Admitting Dx: Spinal cord injury  ASSESSMENT: Pt with the diagnosis of T10 spinal fx with SCI, paraplegia. loss of BLE sensation and motor function after a fall.  Pt is on a full liquid diet at this time due to ileus. Meal completion is 100%. Pt reports she usually has a good appetite, however at this time she has not been feeling hungry due to her ileus and not being able to make a full BM. Spoke with RN, she reports pt has not been able to fully make a BM in 2 weeks. They are giving an enema to pt today to help with the relief. Pt reports no recent weight loss. Pt however is identified by a low braden score, which puts her in risk of skin breakdown. Pt reports she is willing to try glucerna, but would not like it now due to her ileus. Will revisit next time and order glucerna to pt's liking.   Pt with no observed significant fat or muscle mass loss.  Glucose: 213-285 mg/dL  Height: Ht Readings from Last 1 Encounters:  02/17/14 5\' 4"  (1.626 m)    Weight: Wt Readings from Last 1 Encounters:  02/23/14 223 lb 9.6 oz (101.424 kg)    Ideal Body Weight: 120 lbs  % Ideal Body Weight: 186%  Wt Readings from Last 10 Encounters:  02/23/14 223 lb 9.6 oz (101.424 kg)  02/17/14 208 lb 12.4 oz (94.7 kg)  02/17/14 208 lb 12.4 oz (94.7 kg)  02/16/14 200 lb (90.719 kg)  08/15/11 200 lb (90.719 kg)    Usual Body Weight: 200 lbs  % Usual Body Weight: 112%  BMI:  Body mass index is 38.36 kg/(m^2). Class II obesity  Estimated Nutritional Needs: Kcal:  1800-2100 Protein: 80-90 grams Fluid: 1.8 - 2.1 L/day  Skin: +1 RLE, +2 LLE edema  Diet Order: Full Liquid  EDUCATION NEEDS: -No education needs identified at this time   Intake/Output Summary (Last 24 hours) at 02/25/14 0937 Last data filed at 02/25/14 0900  Gross per 24 hour  Intake   1400 ml  Output    550 ml  Net    850 ml    Last BM: 9/3   Labs:   Recent Labs Lab 02/20/14 0225 02/21/14 0231 02/22/14 0620 02/23/14 2031 02/24/14 0843  NA 139 138 136*  --  138  K 3.9 3.9 4.2  --  4.4  CL 100 100 98  --  98  CO2 27 27 24   --  25  BUN 27* 22 28*  --  26*  CREATININE 1.11* 0.83 0.95 1.13* 0.90  CALCIUM 8.3* 8.4 8.6  --  8.6  MG 2.2 1.8 2.0  --   --   PHOS 3.5 3.4 3.5  --   --   GLUCOSE 134* 148* 180*  --  285*    CBG (last 3)   Recent Labs  02/24/14 2337 02/25/14 0347 02/25/14 0725  GLUCAP 220* 238* 213*    Scheduled Meds: . antiseptic oral rinse  7 mL Mouth Rinse BID  . bisacodyl  10 mg Rectal Q0600  .  diltiazem  60 mg Oral 4 times per day  . heparin  5,000 Units Subcutaneous 3 times per day  . insulin aspart  2-6 Units Subcutaneous 6 times per day  . loratadine  10 mg Oral Daily  . metoCLOPramide (REGLAN) injection  10 mg Intravenous 4 times per day  . pantoprazole  40 mg Oral Q1200  . polyethylene glycol  17 g Oral Daily  . senna-docusate  2 tablet Oral QHS  . sorbitol, milk of mag, mineral oil, glycerin (SMOG) enema  960 mL Rectal Once    Continuous Infusions: . sodium chloride      Past Medical History  Diagnosis Date  . Diabetes mellitus   . Hypertension   . Arthritis     Past Surgical History  Procedure Laterality Date  . Abdominal hysterectomy    . Posterior lumbar fusion 4 level N/A 02/17/2014    Procedure: Thoracolumbar Repair;  Surgeon: Erline Levine, MD;  Location: [facility];  Service: Neurosurgery;  Laterality: N/A;  Thoracolumbar Repair T8 - T12    Kallie Locks, MS, Provisional LDN Pager # [phone] After hours/  weekend pager # [phone]

## 2014-02-25 NOTE — Progress Notes (Signed)
Inpatient Diabetes Program Recommendations  AACE/ADA: New Consensus Statement on Inpatient Glycemic Control (2013)  Target Ranges:  Prepandial:   less than 140 mg/dL      Peak postprandial:   less than 180 mg/dL (1-2 hours)      Critically ill patients:  140 - 180 mg/dL   Results for MADDEN, PIAZZA (MRN [id number]) as of 02/25/2014 08:57  Ref. Range 02/24/2014 07:43 02/24/2014 11:55 02/24/2014 15:59 02/24/2014 19:13 02/24/2014 23:37 02/25/2014 03:47 02/25/2014 07:25  Glucose-Capillary Latest Range: 70-99 mg/dL 225 (H) 242 (H) 249 (H) 276 (H) 220 (H) 238 (H) 213 (H)   Diabetes history: DM2 Outpatient Diabetes medications: Metformin 500 mg BID Current orders for Inpatient glycemic control: Novolog 2-6 units Q4H  Inpatient Diabetes Program Recommendations Insulin - Basal: Please order low dose basal insulin; recommend ordering Levemir 10 units daily. Correction (SSI): Please discontinue current ICU glycemic control order and please order Novolog moderate correction scale ACHS.  Note: CBGs consistently greater than 213 mg/dl over the past 24 hours.   Thanks, Barnie Alderman, RN, MSN, CCRN Diabetes Coordinator Inpatient Diabetes Program [phone] (Team Pager) [phone] ([facility] office) [phone] [facility] office)

## 2014-02-25 NOTE — Progress Notes (Signed)
VASCULAR LAB PRELIMINARY  PRELIMINARY  PRELIMINARY  PRELIMINARY  Bilateral lower extremity venous duplex completed.    Preliminary report:  Bilateral:  No evidence of DVT, superficial thrombosis, or Baker's Cyst.   Santana Edell, RVS 02/25/2014, 9:17 AM

## 2014-02-25 NOTE — Progress Notes (Signed)
Orthopedic Tech Progress Note Patient Details:  Yolanda Solis 04/11/1934 [id number] Advanced called to re-evaluate TLSO fit Patient ID: Bethanne Ginger, female   DOB: 10/05/1933, 80 y.o.   MRN: [id number]   Fenton Foy 02/25/2014, 2:31 PM

## 2014-02-25 NOTE — Progress Notes (Signed)
Physical Therapy Session Note  Patient Details  Name: Yolanda Solis MRN: [id number] Date of Birth: March 17, 1934  Today's Date: 02/25/2014 PT Individual Time: 1000-1100 Tx 2: 1500-1600 with +2 Kennieth Rad, PT both sessions PT Individual Time Calculation (min): 60 min  Tx 2: 60 min  Short Term Goals: Week 1:  PT Short Term Goal 1 (Week 1): Patient will assist with rolling side to side using bedrails in preparation for supine to sit.  PT Short Term Goal 2 (Week 1): Patient will propell wheelchair 50 feet on level surfaces using bilateral UE's PT Short Term Goal 3 (Week 1): Patient will perform dynamic sitting activity edge of mat x 2 minutes with min assist. PT Short Term Goal 4 (Week 1): Patient will be able to verbally instruct set up for sliding board transfer.  Skilled Therapeutic Interventions/Progress Updates:    Therapeutic Activity: PT instructs pt in scoot transfer with slideboard and push-up blocks (dicem used to assist in stabilizing) w/c to/from mat with step under feet for support req tot A x 2. PT introduced pt to head-hips relationship and pt began to demonstrate understanding, but once time to push and lean for scoot transfer came, pt closed eyes and froze.  PT instructs pt to observe in overhead lift w/c to bed transfer req tot A x 2 and mechanical lift - pt appeared less apprehensive and scared with overhead lift transfer, today.   W/C Management: PT insructs pt in w/c propulsion 100' with B UE req min A at first, progressing to SBA and verbal cues for UE management with turns.   Neuromuscular Reeducation: PT instructs pt in dynamic sit balance: pulling upper body forward in chair with B armrests x 10 reps.   Pt finished in bed with TLSO brace on, HOB slightly elevated, 3 bedrails up, and all needs in reach.   Tx 2: Therapeutic Exercise: PT instructs pt in pushing into PT's hand on descent phase of R heel slide, tolerating min resistance x 10 reps. PT instructs pt in  pushing into PT's hand on descent phase of L heel slide, tolerating P/AAROM x 10 reps.   Therapeutic Activity: Upon receiving pt in w/c, she c/o nausea and began trying to vomit. Pt had to be assisted into full upright/slight forward lean posture so she was able to spit her vomit. PT and RN assist pt back to bed via overhead lift tot A. PT and RN assist pt in log rolling R/L x 4 reps total to remove sling and remove TLSO brace due to pt c/o of it being too high on her neck.  While in bed, PT administers the motor portion of the ASIA (except for the anal contraction portion). Pt demonstrates trace movement in various myotomes in R and L LE, as well as partial anti-gravity motion of great toe extensor in L foot.   Pt left flat in bed with one pillow under head, on air mattress, B PRAFOs in place, all needs within reach. PT instructed pt not to raise head of bed or knees of bed b/c spinal brace was not on. Orthotist came to room during session and took front of pt's TLSO in order to trim it down in armpit section and in area near neck for improved pt comfort. Pt has motor score of 28 on R side and 29 on L side, for a total motor score of 57. Pt also demonstrates significant R hip extension strength. Pain and nausea and fear continue to be significant limiting factors  with functional mobility. Continue per PT POC.   Therapy Documentation Precautions:  Precautions Precautions: Fall;Back Required Braces or Orthoses: Spinal Brace Spinal Brace: Thoracolumbosacral orthotic;Applied in supine position Restrictions Weight Bearing Restrictions: No Pain: Pain Assessment Pain Assessment: 0-10 Pain Score: Asleep Pain Type: Surgical pain Pain Location: Back Pain Orientation: Medial;Lower;Mid;Upper Pain Onset: On-going Pain Intervention(s): Medication (See eMAR) Multiple Pain Sites: No Tx 2: Pt c/o unrated pain in spine, but is more concerned with nausea at beginning of tx.  Locomotion : Wheelchair  Mobility Distance: 100   See FIM for current functional status  Therapy/Group: Individual Therapy with +2 Kennieth Rad, PT  [facility] M 02/25/2014, 12:54 PM

## 2014-02-25 NOTE — Progress Notes (Signed)
SMOG enema given as ordered. Only mucus and enema was a result. No stool result from enema. Pushed pt abdomen to assist with BM but pt stated, "Ouch, that hurts. Please stop pushing on my belly." I explained importance of helping to move stool but pt unable to tolerate pain. Once all Smog enema given pt continued to only have smog enema and mucus liquid at result. No stool was on enema tip. Will continue to monitor for stool this shift.

## 2014-02-26 ENCOUNTER — Inpatient Hospital Stay (HOSPITAL_COMMUNITY): Payer: Medicare Other | Admitting: Physical Therapy

## 2014-02-26 ENCOUNTER — Inpatient Hospital Stay (HOSPITAL_COMMUNITY): Payer: Medicare Other

## 2014-02-26 DIAGNOSIS — IMO0001 Reserved for inherently not codable concepts without codable children: Secondary | ICD-10-CM

## 2014-02-26 DIAGNOSIS — E1165 Type 2 diabetes mellitus with hyperglycemia: Secondary | ICD-10-CM

## 2014-02-26 DIAGNOSIS — Z5189 Encounter for other specified aftercare: Secondary | ICD-10-CM

## 2014-02-26 DIAGNOSIS — IMO0002 Reserved for concepts with insufficient information to code with codable children: Secondary | ICD-10-CM

## 2014-02-26 DIAGNOSIS — W19XXXA Unspecified fall, initial encounter: Secondary | ICD-10-CM

## 2014-02-26 LAB — GLUCOSE, CAPILLARY
Glucose-Capillary: 195 mg/dL — ABNORMAL HIGH (ref 70–99)
Glucose-Capillary: 242 mg/dL — ABNORMAL HIGH (ref 70–99)
Glucose-Capillary: 242 mg/dL — ABNORMAL HIGH (ref 70–99)
Glucose-Capillary: 288 mg/dL — ABNORMAL HIGH (ref 70–99)

## 2014-02-26 NOTE — Progress Notes (Signed)
[facility] PHYSICAL MEDICINE & REHABILITATION     PROGRESS NOTE    Subjective/Complaints:  some results with SMOG yesterday. Belly feels better today A  review of systems has been performed and if not noted above is otherwise negative.   Objective: Vital Signs: Blood pressure 135/66, pulse 90, temperature 97.7 F (36.5 C), temperature source Oral, resp. rate 19, weight 101.424 kg (223 lb 9.6 oz), SpO2 98.00%. Dg Abd 1 View  02/24/2014   CLINICAL DATA:  80 year old female with distended abdomen. Query ileus. Recent spine surgery. Initial encounter.  EXAM: ABDOMEN - 1 VIEW  COMPARISON:  Lumbar radiographs 02/16/2014. [facility] 121 CT Abdomen and Pelvis 12/25/2006.  FINDINGS: Supine view of the abdomen at 1337 hrs. Gas-filled large bowel loops, mildly distended. Gas-filled nondistended small bowel loops mostly in the lower abdomen. Gas is present to the rectum. There is some retained stool in the right colon. New partially visualized thoracolumbar transpedicular hardware.  IMPRESSION: Increased gas-filled bowel loops, more so the large bowel, compatible with postoperative ileus.   Electronically Signed   By: Lars Pinks M.D.   On: 02/24/2014 14:42    Recent Labs  02/23/14 2031 02/24/14 0843  WBC 16.6* 15.3*  HGB 11.5* 11.8*  HCT 35.5* 37.2  PLT 234 279    Recent Labs  02/23/14 2031 02/24/14 0843  NA  --  138  K  --  4.4  CL  --  98  GLUCOSE  --  285*  BUN  --  26*  CREATININE 1.13* 0.90  CALCIUM  --  8.6   CBG (last 3)   Recent Labs  02/25/14 1621 02/25/14 2049 02/26/14 0721  GLUCAP 346* 245* 195*    Wt Readings from Last 3 Encounters:  02/23/14 101.424 kg (223 lb 9.6 oz)  02/17/14 94.7 kg (208 lb 12.4 oz)  02/17/14 94.7 kg (208 lb 12.4 oz)    Physical Exam:  Constitutional: She is oriented to person, place, and time. She appears well-developed.  HENT: oral mucosa pink/moist  Head: Normocephalic.  Eyes: EOM are normal.  Neck: Normal range of motion. Neck  supple. No thyromegaly present.  Cardiovascular: Normal rate and regular rhythm. No murmurs  Respiratory: Effort normal and breath sounds normal. No respiratory distress. No wheezes  GI: Soft. Bowel sounds are normal. She exhibits less distension and better bowel sounds. Abdomen still large. Back incision clean.   Neuro: alert and oriented. Cognitively appears appropriate  T12 sensory level---0/2 sensation below the level 0/5 strength below waist. No resting tone.  UE motor 5/5, UE sensory normal. CN intact. Cognition appropriate  Psych: normal   Assessment/Plan: 1. Functional deficits secondary to T10 fx/dislocation with complete SCI which require 3+ hours per day of interdisciplinary therapy in a comprehensive inpatient rehab setting. Physiatrist is providing close team supervision and 24 hour management of active medical problems listed below. Physiatrist and rehab team continue to assess barriers to discharge/monitor patient progress toward functional and medical goals. FIM: FIM - Bathing Bathing Steps Patient Completed: Chest;Abdomen Bathing: 1: Two helpers  FIM - Upper Body Dressing/Undressing Upper body dressing/undressing: 0: Wears gown/pajamas-no public clothing FIM - Lower Body Dressing/Undressing Lower body dressing/undressing: 0: Wears Interior and spatial designer  FIM - Toileting Toileting: 1: Two helpers  FIM - Air cabin crew Transfers: 1-Mechanical lift  FIM - Control and instrumentation engineer Devices: Sliding board;Arm rests (push-up blocks) Bed/Chair Transfer: 1: Mechanical lift;1: Two helpers;1: Chair or W/C > Bed: Total A (helper does all/Pt. < 25%)  FIM -  Locomotion: Wheelchair Distance: 100 Locomotion: Wheelchair: 2: Travels 50 - 149 ft with minimal assistance (Pt.>75%) FIM - Locomotion: Ambulation Locomotion: Ambulation: 0: Activity did not occur  Comprehension Comprehension Mode: Auditory Comprehension: 4-Understands basic 75  - 89% of the time/requires cueing 10 - 24% of the time  Expression Expression Mode: Verbal Expression: 5-Expresses basic 90% of the time/requires cueing < 10% of the time.  Social Interaction Social Interaction: 4-Interacts appropriately 75 - 89% of the time - Needs redirection for appropriate language or to initiate interaction.  Problem Solving Problem Solving: 3-Solves basic 50 - 74% of the time/requires cueing 25 - 49% of the time  Memory Memory: 3-Recognizes or recalls 50 - 74% of the time/requires cueing 25 - 49% of the time Medical Problem List and Plan:  1. Functional deficits secondary to T10 fracture with complete paraplegia after a fall. Status post T8-T12 fusion laminectomy decompression  2. DVT Prophylaxis/Anticoagulation: change to lovenox 40q12. Check vascular study today  3. Pain Management: Hydrocodone and Robaxin as needed. Monitor with increased mobility  4. Mood/anxiety: Xanax as needed. Provide emotional support   -will ask neuropsych to see this pt as well 5. Neuropsych: This patient is capable of making decisions on her own behalf.  6. Skin/Wound Care: Routine skin checks of surgical site and pressure areas  7. Hypertension/atrial fibrillation. Presently on Cardizem 60 mg 4 times a day. Cardiac rate control. A chest pain or shortness of breath.  8. Diabetes mellitus with peripheral neuropathy. Hemoglobin A1c 7.9. Presently with sliding scale insulin. Check blood sugars a.c. and at bedtime. Patient on Glucophage 500 mg twice a day prior to admission reason tolerated  -adjust regimen as needed.  9. Neurogenic bowel and bladder: timed bowel program, foley for now---consider voiding trial but given severity of injury, her prognosis for return is poor.  10. Ileus: SMOG enema with some results  -continue iv reglan  -liquid diet for now  -re-check KUB today     LOS (Days) 3 A FACE TO FACE EVALUATION WAS PERFORMED  SWARTZ,ZACHARY T 02/26/2014 9:11 AM

## 2014-02-26 NOTE — IPOC Note (Signed)
Overall Plan of Care [facility]) Patient Details Name: Yolanda Solis MRN: [id number] DOB: 1933-08-04  Admitting Diagnosis: t10 para  Hospital Problems: Active Problems:   SCI (spinal cord injury)     Functional Problem List: Nursing Bladder;Bowel;Endurance;Medication Management;Motor;Pain;Safety;Sensory;Skin Integrity  PT Balance;Pain;Motor;Sensory  OT Balance;Safety;Sensory;Vision;Endurance;Motor;Pain  SLP    TR         Basic ADL's: OT Eating;Grooming;Bathing;Dressing;Toileting     Advanced  ADL's: OT       Transfers: PT Bed Mobility;Bed to Chair;Car  OT Toilet;Tub/Shower     Locomotion: PT Wheelchair Mobility     Additional Impairments: OT    SLP        TR      Anticipated Outcomes Item Anticipated Outcome  Self Feeding Independent  Swallowing      Basic self-care  Mod Assist  Toileting  Max Assist   Bathroom Transfers Mod Assist  Bowel/Bladder  max assist  Transfers  mod assist basic transfers; max assist car  Locomotion  supervision wheelchair mobility controlled and home settings  Communication     Cognition     Pain  < 5  Safety/Judgment  supervision   Therapy Plan: PT Intensity: Minimum of 1-2 x/day ,45 to 90 minutes PT Frequency: 5 out of 7 days PT Duration Estimated Length of Stay: 18 - 21 days OT Intensity: Minimum of 1-2 x/day, 45 to 90 minutes OT Frequency: 5 out of 7 days OT Duration/Estimated Length of Stay: 3 weeks         Team Interventions: Nursing Interventions Patient/Family Education;Medication Management;Bladder Management;Skin Care/Wound Management;Bowel Management;Disease Management/Prevention;Pain Management  PT interventions Discharge planning;Disease management/prevention;Functional mobility training;Pain management;Patient/family education;Therapeutic Activities;Therapeutic Exercise;UE/LE Strength taining/ROM;Wheelchair propulsion/positioning  OT Interventions Pain management;Patient/family education;Skin  care/wound managment;Therapeutic Activities;Self Care/advanced ADL retraining;Therapeutic Exercise;UE/LE Strength taining/ROM;DME/adaptive equipment instruction;Discharge planning;Functional mobility training;Balance/vestibular training;Wheelchair propulsion/positioning  SLP Interventions    TR Interventions    SW/CM Interventions      Team Discharge Planning: Destination: PT-Home ,OT- Home , SLP-  Projected Follow-up: PT-Home health PT, OT-  Home health OT, SLP-  Projected Equipment Needs: PT-Sliding board;Wheelchair (measurements);Wheelchair cushion (measurements), OT- To be determined, SLP-  Equipment Details: PT- , OT-  Patient/family involved in discharge planning: PT- Patient,  OT-Patient, SLP-   MD ELOS: 18-21 days Medical Rehab Prognosis:  Good Assessment: The patient has been admitted for CIR therapies with the diagnosis of T10 fx/dislocation with SCI. The team will be addressing functional mobility, strength, stamina, balance, safety, adaptive techniques and equipment, self-care, bowel and bladder mgt, patient and caregiver education, pain mgt, spinal precautions, egosupport, community reintegration. Goals have been set at mod to max assist with self-care, mod to max assist with transfers, supervision with w/c mobility.    Meredith Staggers, MD, FAAPMR      See Team Conference Notes for weekly updates to the plan of care

## 2014-02-26 NOTE — Progress Notes (Signed)
Physical Therapy Session Note  Patient Details  Name: Yolanda Solis MRN: [id number] Date of Birth: September 18, 1933  Today's Date: 02/26/2014 PT Individual Time: 1305-1350 PT Individual Time Calculation (min): 45 min   Short Term Goals: Week 1:  PT Short Term Goal 1 (Week 1): Patient will assist with rolling side to side using bedrails in preparation for supine to sit.  PT Short Term Goal 2 (Week 1): Patient will propell wheelchair 50 feet on level surfaces using bilateral UE's PT Short Term Goal 3 (Week 1): Patient will perform dynamic sitting activity edge of mat x 2 minutes with min assist. PT Short Term Goal 4 (Week 1): Patient will be able to verbally instruct set up for sliding board transfer.  Skilled Therapeutic Interventions/Progress Updates:   Session focused on pain tolerance, bed mobility, directing care, and pressure relief. Pt received in bed, agreeable to therapy. Prafos removed and socks donned, total A. Pt rolled multiple times to R and L with total A x 2 and use of bed rails to don TLSO and place sling for lift in supine. Pt transferred bed > TIS w/c with mechanical lift and +2 assist. Patient education provided regarding pressure relief while up in w/c and turning schedule while in bed. Pt verbalized understanding, continue to reinforce. Patient able to broadly verbalize some of the steps in multi-step process of getting out of bed. Patient left seated in w/c with all needs within reach, RN tech notified of patient position and need to assist patient with pressure relief/assist back to bed as needed.   Therapy Documentation Precautions:  Precautions Precautions: Fall;Back Required Braces or Orthoses: Spinal Brace Spinal Brace: Thoracolumbosacral orthotic;Applied in supine position Restrictions Weight Bearing Restrictions: No Vital Signs: Therapy Vitals BP: 140/70 mmHg Pain: Pain Assessment Pain Score: 0-No pain  See FIM for current functional status  Therapy/Group:  Individual Therapy  Laretta Alstrom 02/26/2014, 1:51 PM

## 2014-02-27 ENCOUNTER — Inpatient Hospital Stay (HOSPITAL_COMMUNITY): Payer: Medicare Other

## 2014-02-27 ENCOUNTER — Inpatient Hospital Stay (HOSPITAL_COMMUNITY): Payer: Medicare Other | Admitting: *Deleted

## 2014-02-27 DIAGNOSIS — IMO0001 Reserved for inherently not codable concepts without codable children: Secondary | ICD-10-CM

## 2014-02-27 DIAGNOSIS — W19XXXA Unspecified fall, initial encounter: Secondary | ICD-10-CM

## 2014-02-27 DIAGNOSIS — E1165 Type 2 diabetes mellitus with hyperglycemia: Secondary | ICD-10-CM

## 2014-02-27 DIAGNOSIS — IMO0002 Reserved for concepts with insufficient information to code with codable children: Secondary | ICD-10-CM

## 2014-02-27 DIAGNOSIS — Z5189 Encounter for other specified aftercare: Secondary | ICD-10-CM

## 2014-02-27 LAB — GLUCOSE, CAPILLARY
Glucose-Capillary: 239 mg/dL — ABNORMAL HIGH (ref 70–99)
Glucose-Capillary: 258 mg/dL — ABNORMAL HIGH (ref 70–99)
Glucose-Capillary: 269 mg/dL — ABNORMAL HIGH (ref 70–99)
Glucose-Capillary: 198 mg/dL — ABNORMAL HIGH (ref 70–99)

## 2014-02-27 MED ORDER — METOCLOPRAMIDE HCL 10 MG PO TABS
10.0000 mg | ORAL_TABLET | Freq: Three times a day (TID) | ORAL | Status: DC
  Administered 2014-02-27 – 2014-02-28 (×3): 10 mg via ORAL
  Filled 2014-02-27 (×8): qty 1

## 2014-02-27 NOTE — Progress Notes (Signed)
Physical Therapy Session Note  Patient Details  Name: Yolanda Solis MRN: [id number] Date of Birth: 1933/11/05  Today's Date: 02/27/2014 PT Individual Time: 1100-1200 PT Individual Time Calculation (min): 60 min  Session 2 Time: 1400-1420 Time Calculation (min) 20 min  Short Term Goals: Week 1:  PT Short Term Goal 1 (Week 1): Patient will assist with rolling side to side using bedrails in preparation for supine to sit.  PT Short Term Goal 2 (Week 1): Patient will propell wheelchair 50 feet on level surfaces using bilateral UE's PT Short Term Goal 3 (Week 1): Patient will perform dynamic sitting activity edge of mat x 2 minutes with min assist. PT Short Term Goal 4 (Week 1): Patient will be able to verbally instruct set up for sliding board transfer.  Skilled Therapeutic Interventions/Progress Updates:    Session 1: Pt received supine in bed, agreeable to participate in therapy. While preparing pt for transfer, pt found to have been incontinent of bowel w/ large, loose stool. NT assisted PT with cleaning up, changing brief, and changing pants. Pt consistently Max to +2A for rolling both R and L. Pt very fatigued after clean up, but still agreeable to get up to wheelchair. Pt Max-TotalA for rolling L and R for donning TLSO and sling placement. Therapist asked questions during functional mobility to given pt opportunity to direct care, but pt unable to give details on how to place brace or sling. Pt transferred bed>TIS w/c w/ [employer] lift. Therapist used towels to position pt's hips in neutral rotation and neutral for abduction/adduction. Pt agreed to stay up in [place] chair for lunch for upright tolerance. Pt positioned in chair for comfort and left tilted back w/ all needs within reach.  Session 2: Pt received supine in bed, reporting that she had called for nurse to change brief as she had had BM. Therapist assisted NT w/ rolling pt L/R in order to clean and change brief. Pt very fatigued after  rolling this activity, declined further therapy. Pt reported that she stayed up in [place] wheelchair through lunch but that she had significantly increased pain in her back from sitting in the chair. Will continue t follow in order to give pt optimal seating solution. Pt left supine in bed w/ all needs within reach.   Therapy Documentation Precautions:  Precautions Precautions: Fall;Back Required Braces or Orthoses: Spinal Brace Spinal Brace: Thoracolumbosacral orthotic;Applied in supine position Restrictions Weight Bearing Restrictions: No General:   Vital Signs: Therapy Vitals Temp: 98.6 F (37 C) Temp src: Oral Pulse Rate: 84 Resp: 17 BP: 147/63 mmHg Patient Position (if appropriate): Lying Oxygen Therapy SpO2: 94 % O2 Device: None (Room air) Pain: Pain Assessment Pain Assessment: 0-10 Pain Score: Asleep Pain Type: Acute pain Pain Location: Back Pain Orientation: Mid Pain Descriptors / Indicators: Aching Pain Frequency: Intermittent Pain Onset: Gradual Pain Intervention(s): Medication (See eMAR) Mobility:   Locomotion :    Trunk/Postural Assessment :    Balance:   Exercises:   Other Treatments:    See FIM for current functional status  Therapy/Group: Individual Therapy  [name] [name], PT, DPT 02/27/2014, 7:42 AM

## 2014-02-27 NOTE — Progress Notes (Signed)
[place] PHYSICAL MEDICINE & REHABILITATION     PROGRESS NOTE    Subjective/Complaints: Fair night. Stomach feeling ok although emptied bowels multiple times yesterday A  review of systems has been performed and if not noted above is otherwise negative.   Objective: Vital Signs: Blood pressure 147/63, pulse 84, temperature 98.6 F (37 C), temperature source Oral, resp. rate 17, weight 101.424 kg (223 lb 9.6 oz), SpO2 94.00%. Dg Abd 1 View  02/26/2014   CLINICAL DATA:  Abdominal pain, evaluate ileus  EXAM: ABDOMEN - 1 VIEW  COMPARISON:  02/24/2014; CT abdomen pelvis -12/25/2006  FINDINGS: Nonobstructive bowel gas pattern.  No supine evidence of pneumoperitoneum. No pneumatosis or portal venous gas.  No definite abnormal intra-abdominal calcifications.  Limited visualization the lower thorax demonstrates minimal bibasilar opacities, left greater than right.  Post lower thoracic/ upper lumbar paraspinal fusion, incompletely evaluated.  IMPRESSION: Nonobstructive bowel gas pattern.   Electronically Signed   By: Sandi Mariscal M.D.   On: 02/26/2014 14:49   No results found for this basename: WBC, HGB, HCT, PLT,  in the last 72 hours No results found for this basename: NA, K, CL, CO, GLUCOSE, BUN, CREATININE, CALCIUM,  in the last 72 hours CBG (last 3)   Recent Labs  02/26/14 1633 02/26/14 2103 02/27/14 0723  GLUCAP 242* 288* 239*    Wt Readings from Last 3 Encounters:  02/23/14 101.424 kg (223 lb 9.6 oz)  02/17/14 94.7 kg (208 lb 12.4 oz)  02/17/14 94.7 kg (208 lb 12.4 oz)    Physical Exam:  Constitutional: She is oriented to person, place, and time. She appears well-developed.  HENT: oral mucosa pink/moist  Head: Normocephalic.  Eyes: EOM are normal.  Neck: Normal range of motion. Neck supple. No thyromegaly present.  Cardiovascular: Normal rate and regular rhythm. No murmurs  Respiratory: Effort normal and breath sounds normal. No respiratory distress. No wheezes  GI: Soft.  Bowel sounds are normal. She exhibits less distension with improved bowel sounds. Abdomen still large. Back incision clean.   Neuro: alert and oriented. Cognitively appears appropriate  T12 sensory level---0/2 sensation below the level 0/5 strength below waist. No resting tone.  UE motor 5/5, UE sensory normal. CN intact. Cognition appropriate  Psych: normal   Assessment/Plan: 1. Functional deficits secondary to T10 fx/dislocation with complete SCI which require 3+ hours per day of interdisciplinary therapy in a comprehensive inpatient rehab setting. Physiatrist is providing close team supervision and 24 hour management of active medical problems listed below. Physiatrist and rehab team continue to assess barriers to discharge/monitor patient progress toward functional and medical goals. FIM: FIM - Bathing Bathing Steps Patient Completed: Right Arm;Chest;Right upper leg;Left upper leg;Left Arm;Abdomen;Right lower leg (including foot);Left lower leg (including foot);Front perineal area;Buttocks Bathing: 1: Total-Patient completes 0-2 of 10 parts or less than 25%  FIM - Upper Body Dressing/Undressing Upper body dressing/undressing: 0: Wears gown/pajamas-no public clothing FIM - Lower Body Dressing/Undressing Lower body dressing/undressing: 0: Wears Interior and spatial designer  FIM - Toileting Toileting: 1: Two helpers  FIM - Air cabin crew Transfers: 1-Mechanical lift  FIM - Control and instrumentation engineer Devices: Sliding board;Arm rests (push-up blocks) Bed/Chair Transfer: 1: Mechanical lift  FIM - Locomotion: Wheelchair Distance: 100 Locomotion: Wheelchair: 1: Total Assistance/staff pushes wheelchair (Pt<25%) FIM - Locomotion: Ambulation Locomotion: Ambulation: 0: Activity did not occur  Comprehension Comprehension Mode: Auditory Comprehension: 4-Understands basic 75 - 89% of the time/requires cueing 10 - 24% of the time  Expression Expression  Mode: Verbal  Expression: 5-Expresses basic 90% of the time/requires cueing < 10% of the time.  Social Interaction Social Interaction: 4-Interacts appropriately 75 - 89% of the time - Needs redirection for appropriate language or to initiate interaction.  Problem Solving Problem Solving: 3-Solves basic 50 - 74% of the time/requires cueing 25 - 49% of the time  Memory Memory: 3-Recognizes or recalls 50 - 74% of the time/requires cueing 25 - 49% of the time Medical Problem List and Plan:  1. Functional deficits secondary to T10 fracture with complete paraplegia after a fall. Status post T8-T12 fusion laminectomy decompression  2. DVT Prophylaxis/Anticoagulation: change to lovenox 40q12. Check vascular study today  3. Pain Management: Hydrocodone and Robaxin as needed. Monitor with increased mobility  4. Mood/anxiety: Xanax as needed. Provide emotional support   -will ask neuropsych to see this pt as well 5. Neuropsych: This patient is capable of making decisions on her own behalf.  6. Skin/Wound Care: Routine skin checks of surgical site and pressure areas  7. Hypertension/atrial fibrillation. Presently on Cardizem 60 mg 4 times a day. Cardiac rate control. A chest pain or shortness of breath.  8. Diabetes mellitus with peripheral neuropathy. Hemoglobin A1c 7.9. Presently with sliding scale insulin. Check blood sugars a.c. and at bedtime. Patient on Glucophage 500 mg twice a day prior to admission reason tolerated  -adjust regimen as needed.   -resuming CM diet today 9. Neurogenic bowel and bladder: timed bowel program, foley for now---consider voiding trial but given severity of injury, her prognosis for return is poor.  10. Ileus: SMOG enema with some results  -continue daily bowel program  -changed reglan to PO and advance to regular diet  -follow clinically     LOS (Days) 4 A FACE TO FACE EVALUATION WAS PERFORMED  Muneer Leider T 02/27/2014 8:48 AM

## 2014-02-27 NOTE — Progress Notes (Signed)
Occupational Therapy Session Note  Patient Details  Name: Yolanda Solis MRN: [id number] Date of Birth: Jan 08, 1934  Today's Date: 02/27/2014 OT Individual Time:  - 0900-1000  (60 min)  1st session      Short Term Goals: Week 1:  OT Short Term Goal 1 (Week 1): Pt will complete bed rolls with mod assist to manage legs during assisted BADL OT Short Term Goal 2 (Week 1): Pt will demo ability to wash upper body with min assist for thoroughness while supine in bed OT Short Term Goal 3 (Week 1): Pt will demo abiltiy to perform lateral leans for toilet hygiene with mod assist to manage legs OT Short Term Goal 4 (Week 1): Pt will demo ability to don upper body clothing with HOB elevated and mod assist OT Short Term Goal 5 (Week 1): Pt will perform 4 of 5 grooming tasks at w/c level with setup     Skilled Therapeutic Interventions/Progress Updates:  1st session:  Received in bed upon OT arrival.  Addressed  ADL-retraining with emphasis on functional bed mobility, upper body bathing and dressing skills, improved activity tolerance, weight-sifting, and improved active participation with role in directing care/caregivers.   Pt requires facilitation at hips/shoulders to turn and reach for bed rails.  Practiced donning pants and washing feet with Hip,knee flexion and external rotation.  Pt. Tolerated position with no problems.  Rolled to right and left with facilitation of hip flexion, internal rotation, knee flexion.  Pt. Rolled 10 times to each side during functional ADL.  Left pt in bed with  call bell,phone within reach.    Therapy Documentation Precautions:  Precautions Precautions: Fall;Back Required Braces or Orthoses: Spinal Brace Spinal Brace: Thoracolumbosacral orthotic;Applied in supine position Restrictions Weight Bearing Restrictions: No      Pain: Pain Assessment Pain Assessment: 0-10 Pain Score: Asleep Pain Type: Acute pain  2/10 initially; Climbed to 4/10 with mobility Pain  Location: Back Pain Orientation: Mid Pain Descriptors / Indicators: Aching Pain Frequency: Intermittent Pain Onset: Gradual Pain Intervention(s): Medication (See eMAR) ADL: ADL ADL Comments: see FIM Exercises:  Performed BUE AROM and PROM BLE during BADL session   2nd session:  Time:  1600-1700  (60 min) Pain:  4/10 initially climbing to 10 with movements Individual session:  Addressed UE strength and ROM with pt lying in bed.  Pt. Performed shoulder movements in all planes.  Did UB rolling  And reaching rail to prepare for rolling.  Did LE side to side with knees flexed and trace movement of R hip with adduction.  Son and Yolanda Bonine present during session.  They asked lots of questions about home modifications, transport to MD's and bowel and bladder.  Discussed pt and she may have to use a lift after discharge and [facility] bus for transport unless she can use the sliding board.  Pt has son that lives with her who can be with her at night.  She has 2 female relatives that could be with her during the day.   Left pt in bed with supper tray in front.  Asked Janice Norrie to allow pt to do as much as possible to do her own tray set up to encourage independence.  He followed through with suggestion         See FIM for current functional status  Therapy/Group: Individual Therapy  Lisa Roca 02/27/2014, 7:44 AM

## 2014-02-28 ENCOUNTER — Inpatient Hospital Stay (HOSPITAL_COMMUNITY): Payer: Medicare Other

## 2014-02-28 ENCOUNTER — Ambulatory Visit (HOSPITAL_COMMUNITY): Payer: Medicare Other | Admitting: Physical Therapy

## 2014-02-28 LAB — GLUCOSE, CAPILLARY
GLUCOSE-CAPILLARY: 259 mg/dL — AB (ref 70–99)
GLUCOSE-CAPILLARY: 352 mg/dL — AB (ref 70–99)
Glucose-Capillary: 215 mg/dL — ABNORMAL HIGH (ref 70–99)
Glucose-Capillary: 211 mg/dL — ABNORMAL HIGH (ref 70–99)

## 2014-02-28 LAB — BASIC METABOLIC PANEL
Anion gap: 12 (ref 5–15)
BUN: 11 mg/dL (ref 6–23)
CHLORIDE: 99 meq/L (ref 96–112)
CO2: 28 meq/L (ref 19–32)
CREATININE: 0.93 mg/dL (ref 0.50–1.10)
Calcium: 9 mg/dL (ref 8.4–10.5)
GFR calc Af Amer: 67 mL/min — ABNORMAL LOW (ref >90)
GFR calc non Af Amer: 58 mL/min — ABNORMAL LOW (ref >90)
Glucose, Bld: 228 mg/dL — ABNORMAL HIGH (ref 70–99)
Potassium: 5.3 mEq/L (ref 3.7–5.3)
Sodium: 139 mEq/L (ref 137–147)

## 2014-02-28 MED ORDER — METFORMIN HCL 500 MG PO TABS
500.0000 mg | ORAL_TABLET | Freq: Two times a day (BID) | ORAL | Status: DC
  Administered 2014-02-28 – 2014-03-14 (×28): 500 mg via ORAL
  Filled 2014-02-28 (×30): qty 1

## 2014-02-28 NOTE — Plan of Care (Signed)
Problem: RH PAIN MANAGEMENT Goal: RH STG PAIN MANAGED AT OR BELOW PT'S PAIN GOAL Pain managed at or below 4  Outcome: Not Progressing Reports pain as a 5

## 2014-02-28 NOTE — Progress Notes (Signed)
Occupational Therapy Session Note  Patient Details  Name: Yolanda Solis MRN: [id number] Date of Birth: 1934-04-22  Today's Date: 02/28/2014 OT Individual Time: 0815-0900 OT Individual Time Calculation (min): 45 min    Short Term Goals: Week 1:  OT Short Term Goal 1 (Week 1): Pt will complete bed rolls with mod assist to manage legs during assisted BADL OT Short Term Goal 2 (Week 1): Pt will demo ability to wash upper body with min assist for thoroughness while supine in bed OT Short Term Goal 3 (Week 1): Pt will demo abiltiy to perform lateral leans for toilet hygiene with mod assist to manage legs OT Short Term Goal 4 (Week 1): Pt will demo ability to don upper body clothing with HOB elevated and mod assist OT Short Term Goal 5 (Week 1): Pt will perform 4 of 5 grooming tasks at w/c level with setup  Skilled Therapeutic Interventions/Progress Updates: ADL-retraining with emphasis on improved participation in BADL to reduce burden of care.   Pt received supine in bed, alert and oriented to planned task.   With setup and mod assist pt removed shirt and washed upper body using pan bath.    Pt required min vc for thoroughness and sequencing this session.   OT provided total assist with bathing of lower body to include change with diaper and toilet hygiene for BM.   Pt was able to don shirt through arms and head this session and required only min assist to pull shirt over her left shoulder and down her back.   Pt was total assist for lower body dressing although able to complete bed rolls and entire BADL with support from just OT (no helper).    After setup assist to don TLSO, pt was left in bed due to extra time required for mechanical lift transfer with no time remaining in session.      Therapy Documentation Precautions:  Precautions Precautions: Fall;Back Required Braces or Orthoses: Spinal Brace Spinal Brace: Thoracolumbosacral orthotic;Applied in supine position Restrictions Weight  Bearing Restrictions: No  Vital Signs: Therapy Vitals Temp: 97.4 F (36.3 C) Temp src: Oral Pulse Rate: 81 Resp: 16 BP: 153/67 mmHg Patient Position (if appropriate): Lying Oxygen Therapy SpO2: 98 % O2 Device: None (Room air)  Pain: Pain Assessment Pain Assessment: 0-10 Pain Score: 5  Pain Type: Acute pain Pain Location: Back Pain Orientation: Mid Pain Descriptors / Indicators: Aching Pain Frequency: Intermittent Pain Onset: With Activity Pain Intervention(s): Back rub;Rest  ADL: ADL ADL Comments: see FIM  See FIM for current functional status  Therapy/Group: Individual Therapy  Second session: Time: 0300-9233 Time Calculation (min):  45 min  Pain Assessment: 5/10, low back, with activity, repositioned  Skilled Therapeutic Interventions: Therapeutic activities with emphasis on weight-shifting anterior/posterior in w/c, lateral leans in w/c, and introduction to use of leg lifter.   Pt received in reclining w/c, receptive for change of position d/t back pain.   OT re-educated on use of arm rests and w/c management to complete pressure relief.   Pt is unable to recruit sufficient strength from BUE to lift buttocks off cushion this session but reported improved awareness of need for repositioning and the completed anterior weight shift using bed rails and towel as directed to allow assisted repositioning in w/c, 3 times until optimal position was achieved.   OT used Maxi-sky lift to place pt in standard w/c and then pushed pt in w/c to gym to complete 10 min of therex using Sci-Fit.   Pt  was able to maintain 8 minutes of upper arm ergometry to equal .5 miles total distance travelled.    Pt was then returned to her room with call light placed within reach.   See FIM for current functional status  Therapy/Group: Individual Therapy  Dayten Juba 02/28/2014, 9:34 AM

## 2014-02-28 NOTE — Progress Notes (Signed)
Physical Therapy Session Note  Patient Details  Name: Yolanda Solis MRN: [id number] Date of Birth: 1933-09-14  Today's Date: 02/28/2014 PT Individual Time: 1100-1200 PT Individual Time Calculation (min): 60 min  Session 2 Time: 1400-1430 Time Calculation (min) 30 Session 3 Time: 1530-1630 Time Calculation (min) 60   Short Term Goals: Week 1:  PT Short Term Goal 1 (Week 1): Patient will assist with rolling side to side using bedrails in preparation for supine to sit.  PT Short Term Goal 2 (Week 1): Patient will propell wheelchair 50 feet on level surfaces using bilateral UE's PT Short Term Goal 3 (Week 1): Patient will perform dynamic sitting activity edge of mat x 2 minutes with min assist. PT Short Term Goal 4 (Week 1): Patient will be able to verbally instruct set up for sliding board transfer.  Skilled Therapeutic Interventions/Progress Updates:    Session 1: Pt received supine in bed w/ family present, agreeable to participate in therapy. Session focused on transferring out of bed, w/c propulsion, comfort in TIS chair. Extended time was spent setting up for mechanical lift transfer during session. Pt rolled L/R w/ ModA in order to place slings underneath. Pt able to direct care w/ mod cueing. Pt initially transferred w/ [facility] to standard wheelchair in order to address w/c propulsion. Due to brace, placement in chair (pt unable to lean forward to adjust hips due to pain) and arm length pt w/ a lot of difficulty propelling w/c, only able to use small wrist/forearm movements rather than more efficient shoulder movements. Pt transferred to [facility] chair via [facility], reported increased comfort after headrest adjustment and placement of pillow behind back. Pt left tilted back in TIS chair w/ QRB donned w/ all needs within reach.   Session 2: Pt received seated in w/c, agreeable to participate in therapy. Session focused on fitting pt into appropriate standard wheelchair. Pt had been in 22x18  wheelchair, had difficulty propelling w/c due to short arms. Pt transferred w/ MaxiSky lift to 20x18 wheelchair. Pt reported increased comfort in chair. Pt able to propel 10' in room w/ S. Pt left seated in w/c w/ QRB donned w/ friend present and all needs within reach.  Session 3: Pt received seated in w/c, agreeable to participate in therapy. Pt propelled w/c 150' w/ MinA w/ max cueing for larger strokes w/ BUE. Pt continues to have difficulty propelling w/c due to brace and arm length, pt is unable to slide hands back for efficient movement. Pt w/ x1 SBT to R w/ +2 Total A w/c>mat table. After transfer, pt reported significantly increased back pain. Pt sat EOM for 10 minutes w/ BUE support and close S. Pt transferred back to w/c w/ MaxiSky lift. TotalA to transport pt back to room via w/c, then TotalA to transfer w/c>bed w/ MaxiSky lift. Pt rolled L/R w/ MaxA x1 in order to remove sling and remove TLSO. NMR for BLE w/ PNF techniques including rhythmic initiation, approximation, hold/relax while supine in bed, noted pt had trace contraction on RLE w/ quad set and w/ resisted hip extension. Pt left supine in bed w/ PRAFOs donned w/ all needs within reach.  Therapy Documentation Precautions:  Precautions Precautions: Fall;Back Required Braces or Orthoses: Spinal Brace Spinal Brace: Thoracolumbosacral orthotic;Applied in supine position Restrictions Weight Bearing Restrictions: No General:   Vital Signs: Therapy Vitals Temp: 97.4 F (36.3 C) Temp src: Oral Pulse Rate: 81 Resp: 16 BP: 153/67 mmHg Patient Position (if appropriate): Lying Oxygen Therapy SpO2: 98 % O2  Device: None (Room air) Pain: Pain Assessment Pain Assessment: 0-10 Pain Score: 4  Pain Type: Acute pain Pain Location: Back Pain Orientation: Mid Pain Descriptors / Indicators: Aching Pain Frequency: Intermittent Pain Onset: Gradual Pain Intervention(s): Medication (See eMAR) Mobility:   Locomotion :     Trunk/Postural Assessment :    Balance:   Exercises:   Other Treatments:    See FIM for current functional status  Therapy/Group: Individual Therapy  [name] [name], PT, DPT 02/28/2014, 7:49 AM

## 2014-02-28 NOTE — Progress Notes (Signed)
[place] PHYSICAL MEDICINE & REHABILITATION     PROGRESS NOTE    Subjective/Complaints: Back sore. Belly better. Able to sleep last night. Refused am suppository today (sore) A  review of systems has been performed and if not noted above is otherwise negative.   Objective: Vital Signs: Blood pressure 153/67, pulse 81, temperature 97.4 F (36.3 C), temperature source Oral, resp. rate 16, weight 101.424 kg (223 lb 9.6 oz), SpO2 98.00%. Dg Abd 1 View  02/26/2014   CLINICAL DATA:  Abdominal pain, evaluate ileus  EXAM: ABDOMEN - 1 VIEW  COMPARISON:  02/24/2014; CT abdomen pelvis -12/25/2006  FINDINGS: Nonobstructive bowel gas pattern.  No supine evidence of pneumoperitoneum. No pneumatosis or portal venous gas.  No definite abnormal intra-abdominal calcifications.  Limited visualization the lower thorax demonstrates minimal bibasilar opacities, left greater than right.  Post lower thoracic/ upper lumbar paraspinal fusion, incompletely evaluated.  IMPRESSION: Nonobstructive bowel gas pattern.   Electronically Signed   By: Sandi Mariscal M.D.   On: 02/26/2014 14:49   No results found for this basename: WBC, HGB, HCT, PLT,  in the last 72 hours  Recent Labs  02/28/14 0540  NA 139  K 5.3  CL 99  GLUCOSE 228*  BUN 11  CREATININE 0.93  CALCIUM 9.0   CBG (last 3)   Recent Labs  02/27/14 1644 02/27/14 2129 02/28/14 0701  GLUCAP 269* 198* 215*    Wt Readings from Last 3 Encounters:  02/23/14 101.424 kg (223 lb 9.6 oz)  02/17/14 94.7 kg (208 lb 12.4 oz)  02/17/14 94.7 kg (208 lb 12.4 oz)    Physical Exam:  Constitutional: She is oriented to person, place, and time. She appears well-developed.  HENT: oral mucosa pink/moist  Head: Normocephalic.  Eyes: EOM are normal.  Neck: Normal range of motion. Neck supple. No thyromegaly present.  Cardiovascular: Normal rate and regular rhythm. No murmurs  Respiratory: Effort normal and breath sounds normal. No respiratory distress. No wheezes   GI: Soft. Bowel sounds are normal. She exhibits less distension with improved bowel sounds. Abdomen still large. Back incision clean.   Neuro: alert and oriented. Cognitively appears appropriate  T12 sensory level---0/2 sensation below the level 0/5 strength LLE. Now with trace movement Right HF, KE, 0 at right ankle. No resting tone.  UE motor 5/5, UE sensory normal. CN intact. Cognition appropriate  Psych: normal   Assessment/Plan: 1. Functional deficits secondary to T10 fx/dislocation with complete SCI which require 3+ hours per day of interdisciplinary therapy in a comprehensive inpatient rehab setting. Physiatrist is providing close team supervision and 24 hour management of active medical problems listed below. Physiatrist and rehab team continue to assess barriers to discharge/monitor patient progress toward functional and medical goals. FIM: FIM - Bathing Bathing Steps Patient Completed: Right Arm;Chest;Right upper leg;Left upper leg;Left Arm;Abdomen Bathing: 2: Max-Patient completes 3-4 1f 10 parts or 25-49%  FIM - Upper Body Dressing/Undressing Upper body dressing/undressing: 1: Total-Patient completed less than 25% of tasks FIM - Lower Body Dressing/Undressing Lower body dressing/undressing: 1: Total-Patient completed less than 25% of tasks  FIM - Toileting Toileting: 0: Activity did not occur  FIM - Air cabin crew Transfers: 0-Activity did not occur  FIM - Control and instrumentation engineer Devices: Sliding board;Arm rests (push-up blocks) Bed/Chair Transfer: 1: Mechanical lift  FIM - Locomotion: Wheelchair Distance: 100 Locomotion: Wheelchair: 0: Activity did not occur FIM - Locomotion: Ambulation Locomotion: Ambulation: 0: Activity did not occur  Comprehension Comprehension Mode: Auditory Comprehension: 4-Understands basic  75 - 89% of the time/requires cueing 10 - 24% of the time  Expression Expression Mode: Verbal Expression:  5-Expresses basic 90% of the time/requires cueing < 10% of the time.  Social Interaction Social Interaction: 4-Interacts appropriately 75 - 89% of the time - Needs redirection for appropriate language or to initiate interaction.  Problem Solving Problem Solving: 3-Solves basic 50 - 74% of the time/requires cueing 25 - 49% of the time  Memory Memory: 3-Recognizes or recalls 50 - 74% of the time/requires cueing 25 - 49% of the time Medical Problem List and Plan:  1. Functional deficits secondary to T10 fracture with complete paraplegia after a fall. Status post T8-T12 fusion laminectomy decompression  2. DVT Prophylaxis/Anticoagulation:   lovenox 40q12. Dopplers neg 3. Pain Management: Hydrocodone and Robaxin as needed. Monitor with increased mobility  4. Mood/anxiety: Xanax as needed. Provide emotional support   - neuropsych to see this pt as well 5. Neuropsych: This patient is capable of making decisions on her own behalf.  6. Skin/Wound Care: Routine skin checks of surgical site and pressure areas  7. Hypertension/atrial fibrillation. Presently on Cardizem 60 mg 4 times a day. Cardiac rate control. A chest pain or shortness of breath.  8. Diabetes mellitus with peripheral neuropathy. Hemoglobin A1c 7.9. Presently with sliding scale insulin. Check blood sugars a.c. and at bedtime/  -increase glucophage to 500mg  bid   -resumed CM diet  9. Neurogenic bowel and bladder: timed bowel program, foley for now---consider voiding trial but given severity of injury, her prognosis for return is poor.  10. Ileus: SMOG enema with some results  -continue daily bowel program  -dc reglan as she still having loose stools  -dc miralax, continue has senna s        LOS (Days) 5 A FACE TO FACE EVALUATION WAS PERFORMED  SWARTZ,ZACHARY T 02/28/2014 8:10 AM

## 2014-02-28 NOTE — Progress Notes (Signed)
Patient absolutely refusing to take supp this AM.  States, "That's too painful".

## 2014-03-01 ENCOUNTER — Inpatient Hospital Stay (HOSPITAL_COMMUNITY): Payer: Medicare Other

## 2014-03-01 ENCOUNTER — Inpatient Hospital Stay (HOSPITAL_COMMUNITY): Payer: Medicare Other | Admitting: Physical Therapy

## 2014-03-01 LAB — GLUCOSE, CAPILLARY
GLUCOSE-CAPILLARY: 306 mg/dL — AB (ref 70–99)
Glucose-Capillary: 260 mg/dL — ABNORMAL HIGH (ref 70–99)
Glucose-Capillary: 191 mg/dL — ABNORMAL HIGH (ref 70–99)
Glucose-Capillary: 201 mg/dL — ABNORMAL HIGH (ref 70–99)

## 2014-03-01 MED ORDER — FENTANYL 12 MCG/HR TD PT72
12.5000 ug | MEDICATED_PATCH | TRANSDERMAL | Status: DC
  Administered 2014-03-01 – 2014-03-13 (×5): 12.5 ug via TRANSDERMAL
  Filled 2014-03-01 (×5): qty 1

## 2014-03-01 MED ORDER — INSULIN GLARGINE 100 UNIT/ML ~~LOC~~ SOLN
10.0000 [IU] | Freq: Every day | SUBCUTANEOUS | Status: DC
  Administered 2014-03-01 – 2014-03-02 (×2): 10 [IU] via SUBCUTANEOUS
  Filled 2014-03-01 (×3): qty 0.1

## 2014-03-01 NOTE — Progress Notes (Signed)
Social Work Assessment and Plan  Patient Details  Name: ADRINNE Solis MRN: [id number] Date of Birth: [birth year]  Today's Date: 03/01/2014  Problem List:  Patient Active Problem List   Diagnosis Date Noted  . SCI (spinal cord injury) 02/23/2014  . Fracture dislocation of thoracic spine 02/18/2014  . HTN (hypertension) 02/18/2014  . Type II or unspecified type diabetes mellitus without mention of complication, uncontrolled 02/18/2014  . Fracture of thoracic spine 02/17/2014   Past Medical History:  Past Medical History  Diagnosis Date  . Diabetes mellitus   . Hypertension   . Arthritis    Past Surgical History:  Past Surgical History  Procedure Laterality Date  . Abdominal hysterectomy    . Posterior lumbar fusion 4 level N/A 02/17/2014    Procedure: Thoracolumbar Repair;  Surgeon: Erline Levine, MD;  Location: [place] [facility];  Service: Neurosurgery;  Laterality: N/A;  Thoracolumbar Repair T8 - T12   Social History:  reports that she has never smoked. She does not have any smokeless tobacco history on file. She reports that she does not drink alcohol or use illicit drugs.  Family / Support Systems Marital Status: Widow/Widower How Long?: 37 years Patient Roles: Parent Children: Yolanda Solis [phone] Other Supports: Yolanda Solis - sister  [phone] Anticipated Caregiver: son and his fiance, grandsons, cousins, etc Ability/Limitations of Caregiver: son works days. Family pulling together 24/7 physical care at home Family Dynamics: close, extended family  Social History Preferred language: English Religion:  Read: Yes Write: Yes Employment Status: Retired Date Retired/Disabled/Unemployed: about 20 years ago Public relations account executive Issues: None reported Guardian/Conservator: None reported   Abuse/Neglect Physical Abuse: Denies Verbal Abuse: Denies Sexual Abuse: Denies Exploitation of patient/patient's resources: Denies Self-Neglect: Denies  Emotional  Status Pt's affect, behavior and adjustment status: Pt has a strong, tough personality and is not going to "let something like this get me down." Recent Psychosocial Issues: None reported Psychiatric History: None reported Substance Abuse History: None reported  Patient / Family Perceptions, Expectations & Goals Pt/Family understanding of illness & functional limitations: Pt and son report receiving information from doctors and team and have a good understanding of her condition and are already making arrangements for pt's d/c to home. Premorbid pt/family roles/activities: Pt enjoys reading and doing puzzle books. Anticipated changes in roles/activities/participation: Pt would like to return to these activities as she is able.  Pt/family expectations/goals: Pt would like to be able to do as much for herself as possible.  Community Resources [employer] Scripts: None Premorbid Home Care/DME Agencies: None Transportation available at discharge: son Resource referrals recommended: Neuropsychology;Support group (specify)  Discharge Planning Living Arrangements: Children (PTA pt was at home alone.  Plan is to go to her son's home with family available 24/7.) Support Systems: Children;Other relatives Type of Residence: Private residence Insurance Resources: Multimedia programmer (specify) ([employer]/AARP Medicare Complete) Financial Resources: Fish farm manager;Other (Comment) (retirement fund) Financial Screen Referred: No Living Expenses: Own Money Management: Patient (has family to assist as needed) Does the patient have any problems obtaining your medications?: No Home Management: Pt had assistance from her family with some home management. Patient/Family Preliminary Plans: Pt plans to go to her son's home with family members taking turns to provide pt with 24/7 care. Social Work Anticipated Follow Up Needs: HH/OP;Support Group Expected length of stay: 3 weeks  Clinical  Impression CSW met with pt to introduce self and role of CSW, as well as complete assessment.  Pt also gave permission for CSW to f/u with  her son, Yolanda Solis, to do the same.  Yolanda Solis and family are planning to bring pt home and are making arrangements to have family with her 24/7.  There are many medical professionals in her family.  Yolanda Solis wondered about DME and f/u therapies and set up of home.  CSW assured him that we would make sure she has what she needs prior to d/c and would complete family education with him and anyone else who may be interested to make sure that they felt comfortable with her care.  He was appreciative of this and seems very receptive to anything we as a team can do to help them take the best care of the pt at home.  CSW encouraged him to call anytime and told the pt and son that CSW would continue to follow them and assist along the way and closer to d/c.  Pt reports feeling well emotionally and CSW told her that CSW would continue to check on pt in re: to this, as her new condition is quite a change for her.  She is strong and determined to persevere.  CSW will continue to follow, support, and assist as needed.  Yolanda Solis, Silvestre Mesi 03/01/2014, 12:22 AM

## 2014-03-01 NOTE — Progress Notes (Signed)
Physical Therapy Session Note  Patient Details  Name: Yolanda Solis MRN: [id number] Date of Birth: [birth year]  Today's Date: 03/01/2014 PT Individual Time: 9470-9628 PT Individual Time Calculation (min): 60 min   Short Term Goals: Week 1:  PT Short Term Goal 1 (Week 1): Patient will assist with rolling side to side using bedrails in preparation for supine to sit.  PT Short Term Goal 2 (Week 1): Patient will propell wheelchair 50 feet on level surfaces using bilateral UE's PT Short Term Goal 3 (Week 1): Patient will perform dynamic sitting activity edge of mat x 2 minutes with min assist. PT Short Term Goal 4 (Week 1): Patient will be able to verbally instruct set up for sliding board transfer.  Skilled Therapeutic Interventions/Progress Updates:    Pt received seated in w/c, agreeable to participate in therapy. Session focused on wheelchair seating, functional mobility, upright tolerance. Time spent during session w/ PT Audra discussing optimal seating solution for pt, with various backs being considered for pt's standard wheelchair. Pressure relieving gel cushion fit into pt's TIS chair. Attempted to have pt stand in standing frame, however pt unable to tolerate standing, reported significantly increased back pain before frame was able to stand pt up. Pt sat back down in chair and allowed to rest. Attempted one more stand in frame w/ feet repositioned for increased ease of stand, however pt still unable to tolerate standing. Pt transported back to room via w/c, performed sliding board transfer Max-Total A +2 to L w/c>bed. Pt able to recall head-hips relationship or back precautions w/ mod cueing. Performed BLE PNF exercises in supine, no change in motor return vs yesterday. Pt left supine in bed w/ all needs within reach.  Therapy Documentation Precautions:  Precautions Precautions: Fall;Back Required Braces or Orthoses: Spinal Brace Spinal Brace: Thoracolumbosacral orthotic;Applied in  supine position Restrictions Weight Bearing Restrictions: No General:   Vital Signs: Therapy Vitals Temp: 98.7 F (37.1 C) Temp src: Oral Pulse Rate: 85 Resp: 16 BP: 142/53 mmHg Patient Position (if appropriate): Lying Oxygen Therapy SpO2: 96 % O2 Device: None (Room air) Pain:   Mobility:   Locomotion :    Trunk/Postural Assessment :    Balance:   Exercises:   Other Treatments:    See FIM for current functional status  Therapy/Group: Individual Therapy  [name] [name], PT, DPT 03/01/2014, 8:34 AM

## 2014-03-01 NOTE — Progress Notes (Signed)
Physical Therapy Session Note  Patient Details  Name: Yolanda Solis MRN: [id number] Date of Birth: Nov 11, 1933  Today's Date: 03/01/2014 PT Individual Time: 4132-4401 PT Individual Time Calculation (min): 70 min   Short Term Goals: Week 1:  PT Short Term Goal 1 (Week 1): Patient will assist with rolling side to side using bedrails in preparation for supine to sit.  PT Short Term Goal 2 (Week 1): Patient will propell wheelchair 50 feet on level surfaces using bilateral UE's PT Short Term Goal 3 (Week 1): Patient will perform dynamic sitting activity edge of mat x 2 minutes with min assist. PT Short Term Goal 4 (Week 1): Patient will be able to verbally instruct set up for sliding board transfer.  Skilled Therapeutic Interventions/Progress Updates:   Pt received in manual w/c with pillow behind pt's back.  Pt also noted to continue to have issues with proper fit of anterior portion of TLSO with upper border rising up to her nose.  Pt performed w/c mobility x 100' in controlled environment with min A with intermittent verbal cues for proper propulsion sequence and self correction of R lateral lean to allow for LUE to reach drive wheel on L.  Pt set up at mat and performed transfers mat <> w/c with block under feet and +2 A for safety with pt performing 10-15% pushing through UE and with improved WB through LE with verbal and tactile cues for full anterior lean and use of head-hips relationship to advance hips across board.  On mat pt engaged in sitting balance and postural control training beginning with feet supported progressing to feet unsupported with focus on finding and maintaining balance point, movements outside limits of stability and returning to balance point with UE support and then progressing to R and LUE reaching forwards and laterally out of BOS >> progressing to bilat UE holding ball and reaching forwards, laterally and overhead all with mod-max A for full weight shifting; pt very  anxious and fearful of falling during weight shifting especially to L.  Pt did require frequent rest breaks secondary to feeling "dizzy".  Returned to tilt in space w/c and leg rests raised to provide more LE positioning and support.    Discussed need with primary PT for a different back that would allow for increased support and positioning and allow improved efficiency with w/c propulsion.  Changed manual w/c back to 20" width Jay back with gel cushion in w/c; will place pt in manual w/c tomorrow to assess fit and positioning.     Therapy Documentation Precautions:  Precautions Precautions: Fall;Back Required Braces or Orthoses: Spinal Brace Spinal Brace: Thoracolumbosacral orthotic;Applied in supine position Restrictions Weight Bearing Restrictions: No Vital Signs: Therapy Vitals Pulse Rate: 60 BP: 150/54 mmHg Pain: Pain Assessment Pain Assessment: No/denies pain Pain Score: 4   See FIM for current functional status  Therapy/Group: Individual Therapy  Raylene Everts Faucette 03/01/2014, 1:47 PM

## 2014-03-01 NOTE — Progress Notes (Addendum)
Inpatient Diabetes Program Recommendations  AACE/ADA: New Consensus Statement on Inpatient Glycemic Control (2013)  Target Ranges:  Prepandial:   less than 140 mg/dL      Peak postprandial:   less than 180 mg/dL (1-2 hours)      Critically ill patients:  140 - 180 mg/dL   Fasting glucose is continuallly high in 200's, thus glucose throughout the day remains in the 200's to 300's despite correction insulin.  Inpatient Diabetes Program Recommendations Insulin - Basal: Please add low dose basal insulin to pt's regmen while healing/rehabbing.  Lantus or levemir10 units daily or HS Correction (SSI): Continue as ordered. Pt's rehabilitation may well benefit from better controlled blood sugars,  Thank you, Rosita Kea, RN, CNS, Diabetes Coordinator [phone])

## 2014-03-01 NOTE — Progress Notes (Signed)
Occupational Therapy Session Note  Patient Details  Name: Yolanda Solis MRN: [id number] Date of Birth: [birth year]  Today's Date: 03/01/2014 OT Individual Time: 8338-2505 OT Individual Time Calculation (min): 60 min    Short Term Goals: Week 1:  OT Short Term Goal 1 (Week 1): Pt will complete bed rolls with mod assist to manage legs during assisted BADL OT Short Term Goal 2 (Week 1): Pt will demo ability to wash upper body with min assist for thoroughness while supine in bed OT Short Term Goal 3 (Week 1): Pt will demo abiltiy to perform lateral leans for toilet hygiene with mod assist to manage legs OT Short Term Goal 4 (Week 1): Pt will demo ability to don upper body clothing with HOB elevated and mod assist OT Short Term Goal 5 (Week 1): Pt will perform 4 of 5 grooming tasks at w/c level with setup  Skilled Therapeutic Interventions/Progress Updates: ADL-retraining with focus on improved upper body bathind/dressing skills, bed rolls, attention, and slide board transfer retraining.  Pt received supine in bed, alert and receptive for assisted BADL.   With +2 helper present pt performed bed rolls with mod assist to manage LE as helper washed her back.   With setup assist pt is able to wash her face, hands, chest, abdomin, arms, unassisted while supine, HOB raised less than 30 degrees.  Pt washed peri-area with OT and helper lifting and abducting her legs.   Pt now demo's right ankle flexion/extension but is unable flex knee or hip.    Pt required setup assist to don TLSO and mod assist for lower body bathing below her knees with total assist to don diaper, pants and socks, performing bed rolls to hike pants over her hips, again with mod assist and verbal cues to reach for bedrails.   Pt requires max assist to rise to sit at edge of bed.   With facilitation to achieve static sitting balance, pt maintained balance at edge of bed for 30 seconds and completed 5 sets of lateral leans with mod assist to  maintain balance.   Pt completed total assist transfer to w/c from edge of bed using slide board and leaned to her right to grasp arm rest with min assist to initiate lean.     Therapy Documentation Precautions:  Precautions Precautions: Fall;Back Required Braces or Orthoses: Spinal Brace Spinal Brace: Thoracolumbosacral orthotic;Applied in supine position Restrictions Weight Bearing Restrictions: No  Pain: Pain Assessment Pain Assessment: No/denies pain Pain Score: 0-No pain Pain Type: Acute pain Pain Location: Back Pain Intervention(s): Repositioned;Distraction  ADL: ADL ADL Comments: see FIM  See FIM for current functional status  Therapy/Group: Individual Therapy  Second session: Time: 1300-1400 Time Calculation (min):  60 min  Pain Assessment: 5/10, low back with activity  Skilled Therapeutic Interventions: ADL-retraining and therapeutic activities to reinforce skills with bed rolls (log roll method), static and dynamic sitting balance, toileting and initial instruction on use of lateral leans while on BSC.   Pt received supine with TLSO on, reporting moderate low back pain but receptive for planned task: to practice sitting on BSC.   With helper to manage mech lift, pt was placed on bedside commode to initiate instruction in toileting (clothing management and hygiene).   After repeated facilitation to maintain balance and copious cues to keep her eyes open, pt maintained static sitting, feet supported and her hands placed on her knees, on BSC for 45 seconds.   With mod facilitation pt performed anterior/posterior and lateral  weight-shifting on BSC with feet supported and mod assist to initiate weight-shifting and to maintain balance.    OT then noted odor from pt suspicious for BM and returned pt to bed to complete toilet care (hygiene and change of diaper).   Pt left in bed, TLSO removed for comfort with call light and phone placed within reach.   See FIM for current  functional status  Therapy/Group: Individual Therapy  [place] 03/01/2014, 10:26 AM

## 2014-03-01 NOTE — Progress Notes (Signed)
[place] PHYSICAL MEDICINE & REHABILITATION     PROGRESS NOTE    Subjective/Complaints: Back still quite sore. Affects activity tolerance.  A  review of systems has been performed and if not noted above is otherwise negative.   Objective: Vital Signs: Blood pressure 142/53, pulse 85, temperature 98.7 F (37.1 C), temperature source Oral, resp. rate 16, weight 101.424 kg (223 lb 9.6 oz), SpO2 96.00%. No results found. No results found for this basename: WBC, HGB, HCT, PLT,  in the last 72 hours  Recent Labs  02/28/14 0540  NA 139  K 5.3  CL 99  GLUCOSE 228*  BUN 11  CREATININE 0.93  CALCIUM 9.0   CBG (last 3)   Recent Labs  02/28/14 1702 02/28/14 2036 03/01/14 0721  GLUCAP 352* 211* 260*    Wt Readings from Last 3 Encounters:  02/23/14 101.424 kg (223 lb 9.6 oz)  02/17/14 94.7 kg (208 lb 12.4 oz)  02/17/14 94.7 kg (208 lb 12.4 oz)    Physical Exam:  Constitutional: She is oriented to person, place, and time. She appears well-developed.  HENT: oral mucosa pink/moist  Head: Normocephalic.  Eyes: EOM are normal.  Neck: Normal range of motion. Neck supple. No thyromegaly present.  Cardiovascular: Normal rate and regular rhythm. No murmurs  Respiratory: Effort normal and breath sounds normal. No respiratory distress. No wheezes  GI: Soft. Bowel sounds are normal. She exhibits less distension with improved bowel sounds. Abdomen still large. Back incision clean.   Neuro: alert and oriented. Cognitively appears appropriate  T12 sensory level---0/2 sensation below the level 0/5 strength LLE. Now with trace movement Right HF, KE, 0 at right ankle. No resting tone.  UE motor 5/5, UE sensory normal. CN intact. Cognition appropriate  Psych: normal   Assessment/Plan: 1. Functional deficits secondary to T10 fx/dislocation with complete SCI which require 3+ hours per day of interdisciplinary therapy in a comprehensive inpatient rehab setting. Physiatrist is providing  close team supervision and 24 hour management of active medical problems listed below. Physiatrist and rehab team continue to assess barriers to discharge/monitor patient progress toward functional and medical goals. FIM: FIM - Bathing Bathing Steps Patient Completed: Chest;Right Arm;Left Arm;Abdomen Bathing: 2: Max-Patient completes 3-4 81f 10 parts or 25-49%  FIM - Upper Body Dressing/Undressing Upper body dressing/undressing steps patient completed: Thread/unthread right sleeve of pullover shirt/dresss;Thread/unthread left sleeve of pullover shirt/dress;Put head through opening of pull over shirt/dress Upper body dressing/undressing: 4: Min-Patient completed 75 plus % of tasks FIM - Lower Body Dressing/Undressing Lower body dressing/undressing: 1: Total-Patient completed less than 25% of tasks  FIM - Toileting Toileting: 1: Total-Patient completed zero steps, helper did all 3  FIM - Air cabin crew Transfers: 0-Activity did not occur  FIM - Control and instrumentation engineer Devices: Sliding board;Arm rests (push-up blocks) Bed/Chair Transfer: 1: Mechanical lift;1: Two helpers  FIM - Locomotion: Wheelchair Distance: 100 Locomotion: Wheelchair: 2: Travels 50 - 149 ft with minimal assistance (Pt.>75%) FIM - Locomotion: Ambulation Locomotion: Ambulation: 0: Activity did not occur  Comprehension Comprehension Mode: Auditory Comprehension: 4-Understands basic 75 - 89% of the time/requires cueing 10 - 24% of the time  Expression Expression Mode: Verbal Expression: 5-Expresses basic 90% of the time/requires cueing < 10% of the time.  Social Interaction Social Interaction: 4-Interacts appropriately 75 - 89% of the time - Needs redirection for appropriate language or to initiate interaction.  Problem Solving Problem Solving: 3-Solves basic 50 - 74% of the time/requires cueing 25 - 49% of the  time  Memory Memory: 3-Recognizes or recalls 50 - 74% of the  time/requires cueing 25 - 49% of the time Medical Problem List and Plan:  1. Functional deficits secondary to T10 fracture with complete paraplegia after a fall. Status post T8-T12 fusion laminectomy decompression  2. DVT Prophylaxis/Anticoagulation:   lovenox 40q12. Dopplers neg 3. Pain Management: Hydrocodone and Robaxin as needed. Monitor with increased mobility  4. Mood/anxiety: Xanax as needed. Provide emotional support   - neuropsych to see this pt as well 5. Neuropsych: This patient is capable of making decisions on her own behalf.  6. Skin/Wound Care: Routine skin checks of surgical site and pressure areas  7. Hypertension/atrial fibrillation. Presently on Cardizem 60 mg 4 times a day. Cardiac rate control. A chest pain or shortness of breath.  8. Diabetes mellitus with peripheral neuropathy. Hemoglobin A1c 7.9. Presently with sliding scale insulin. Check blood sugars a.c. and at bedtime/  -increased glucophage to 500mg  bid  -add scheduled lantus   -resumed CM diet  9. Neurogenic bowel and bladder: timed bowel program, foley for now---consider voiding trial but given severity of injury, her prognosis for return is poor.  10. Ileus: resolved  -continue daily bowel program---scheduled supp in am  -dc'ed reglan as she still having loose stools  -dc'ed miralax, continue has senna s        LOS (Days) 6 A FACE TO FACE EVALUATION WAS PERFORMED  Mayur Duman T 03/01/2014 7:46 AM

## 2014-03-02 ENCOUNTER — Inpatient Hospital Stay (HOSPITAL_COMMUNITY): Payer: Medicare Other | Admitting: Occupational Therapy

## 2014-03-02 ENCOUNTER — Inpatient Hospital Stay (HOSPITAL_COMMUNITY): Payer: Medicare Other

## 2014-03-02 ENCOUNTER — Inpatient Hospital Stay (HOSPITAL_COMMUNITY): Payer: Medicare Other | Admitting: Physical Therapy

## 2014-03-02 LAB — GLUCOSE, CAPILLARY
Glucose-Capillary: 180 mg/dL — ABNORMAL HIGH (ref 70–99)
Glucose-Capillary: 243 mg/dL — ABNORMAL HIGH (ref 70–99)
Glucose-Capillary: 245 mg/dL — ABNORMAL HIGH (ref 70–99)
Glucose-Capillary: 168 mg/dL — ABNORMAL HIGH (ref 70–99)

## 2014-03-02 NOTE — Progress Notes (Signed)
Physical Therapy Session Note  Patient Details  Name: Yolanda Solis MRN: [id number] Date of Birth: [birth year]  Today's Date: 03/02/2014 PT Individual Time: 1102-1205 PT Individual Time Calculation (min): 63 min  Session 2 Time: 3875-6433 Time Calculation (min): 60 min   Short Term Goals: Week 1:  PT Short Term Goal 1 (Week 1): Patient will assist with rolling side to side using bedrails in preparation for supine to sit.  PT Short Term Goal 2 (Week 1): Patient will propell wheelchair 50 feet on level surfaces using bilateral UE's PT Short Term Goal 3 (Week 1): Patient will perform dynamic sitting activity edge of mat x 2 minutes with min assist. PT Short Term Goal 4 (Week 1): Patient will be able to verbally instruct set up for sliding board transfer.  Skilled Therapeutic Interventions/Progress Updates:    Session 1: Pt received supine in bed w/ HOB elevated handed off from OT. PT Audra present throughout session for +2 Assist. Pt transferred to standard wheelchair w/ use of MaxiSky lift, pt able to direct transfer w/ mod cueing. Pt propelled w/c 100' to rehab gym w/ MinA, noted increased lateral trunk flexion to R resulting in decreased efficiency of propulsion w/ LUE. Consistent positioning seen when sitting EOB , pt prefers to WB through R hip rather than distributing weight evenly. Pt performed sliding board transfer to R w/ TotalA +2 w/ pt able to help 10% w/ lean and pushing through BUE. Pt benefited from having feet elevated on step during transfer. Dynamic seated balance at EOB w/ reaching, trunk control, increasing WBing through BLE through anterior weight shift. With good setup and no fatigue pt able to sit statically w/ no UE support for 3-4 seconds at a time. However, when fatigued pt requires at least 1 UE supported to maintain balance. Noted increased difficulty reaching to L. During seated balance exercises PT Audra adjusted pt's wheelchair for increased ease of propulsion by  lowering seat height. Pt transferred mat>w/c w/ TotalA+2 via sliding board to L. Pt propelled w/c 100' w/ MinA back to room. While setting pt up with lunch tray, pt reported sudden nausea and stated she thought she was going to vomit. Pt's RN called, pt left handed off to RN.   Session 2: Pt received seated in w/c, agreeable to participate in therapy. Pt interacted w/ therapy dog reaching outside BOS w/ LUE while seated in w/c. Pt propelled w/c to rehab gym w/ close S and intermittent MinA for making turns and navigating tight doorways. SBT w/ +2 Total A w/c to mat to R. Worked on seated dynamic balance w/ horse shoe tossing activity, then ball tossing activity with yellow theraball. Pt able to maintain dynamic seated balance w/ no UE support intermittently as long as weight remained anterior. When pt started leaning posteriorly she was unable to correct without assist. Pt reached for 2# cuffs w/ RUE and LUE at mod limits of BOS anteriorly and laterally. Pt w/ SBT to L w/ TotalA +2 mat>w/c. Because of fatigue, pt transported back to room via w/c and transferred back to bed via MaxiSky sling. Pt rolled R and L w/ ModA in order to remove sling and TLSO while supine in bed. Pt left supine in bed w/ all needs within reach. At end of session pt reported she may have had BM, RN made aware.   Therapy Documentation Precautions:  Precautions Precautions: Fall;Back Required Braces or Orthoses: Spinal Brace Spinal Brace: Thoracolumbosacral orthotic;Applied in supine position Restrictions Weight Bearing Restrictions: No  General:   Vital Signs: Therapy Vitals Temp: 97.9 F (36.6 C) Temp src: Oral Pulse Rate: 70 Resp: 16 BP: 110/51 mmHg Patient Position (if appropriate): Lying Oxygen Therapy SpO2: 97 % O2 Device: None (Room air) Pain:   Mobility:   Locomotion :    Trunk/Postural Assessment :    Balance:   Exercises:   Other Treatments:    See FIM for current functional  status  Therapy/Group: Individual Therapy and Co-Treatment  [name] [name], PT, DPT 03/02/2014, 7:55 AM

## 2014-03-02 NOTE — Progress Notes (Signed)
Social Work Patient ID: Yolanda Solis, female   DOB: 12/01/1933, 79 y.o.   MRN: [id number]  CSW met with pt and her son, Yolanda Solis, to update them on team conference discussion.  Explained to them that d/c date has not been set at this time, due to long length of stay expected and pt's progress hard to assess due to medical complications.  Pt's family is preparing for her to return home with 8 people willing to take turns caring for pt.  Son's church is building a ramp for them and son is clearing a room for pt in his home.  CSW explained that caregivers will have the opportunity to come and train with our therapists.  He was appreciative of that.  Neuropsychology referral made at the request of Dr. Swartz.  CSW will continue to follow pt and will assist as needed.  

## 2014-03-02 NOTE — Progress Notes (Signed)
[place] PHYSICAL MEDICINE & REHABILITATION     PROGRESS NOTE    Subjective/Complaints: Back better today. Able to sleep last night. No more loose stools.  A  review of systems has been performed and if not noted above is otherwise negative.   Objective: Vital Signs: Blood pressure 110/51, pulse 70, temperature 97.9 F (36.6 C), temperature source Oral, resp. rate 16, weight 101.424 kg (223 lb 9.6 oz), SpO2 97.00%. No results found. No results found for this basename: WBC, HGB, HCT, PLT,  in the last 72 hours  Recent Labs  02/28/14 0540  NA 139  K 5.3  CL 99  GLUCOSE 228*  BUN 11  CREATININE 0.93  CALCIUM 9.0   CBG (last 3)   Recent Labs  03/01/14 1637 03/01/14 2127 03/02/14 0658  GLUCAP 191* 201* 180*    Wt Readings from Last 3 Encounters:  02/23/14 101.424 kg (223 lb 9.6 oz)  02/17/14 94.7 kg (208 lb 12.4 oz)  02/17/14 94.7 kg (208 lb 12.4 oz)    Physical Exam:  Constitutional: She is oriented to person, place, and time. She appears well-developed.  HENT: oral mucosa pink/moist  Head: Normocephalic.  Eyes: EOM are normal.  Neck: Normal range of motion. Neck supple. No thyromegaly present.  Cardiovascular: Normal rate and regular rhythm. No murmurs  Respiratory: Effort normal and breath sounds normal. No respiratory distress. No wheezes  GI: Soft. Bowel sounds are normal. She exhibits less distension with improved bowel sounds. Abdomen still large. Back incision clean.   Neuro: alert and oriented. Cognitively appears appropriate  T12 sensory level---0/2 sensation below the level 0/5 strength LLE. Now with trace movement Right HF, KE, 0 at right ankle. No resting tone.  UE motor 5/5, UE sensory normal. CN intact. Cognition appropriate  Psych: normal   Assessment/Plan: 1. Functional deficits secondary to T10 fx/dislocation with complete SCI which require 3+ hours per day of interdisciplinary therapy in a comprehensive inpatient rehab  setting. Physiatrist is providing close team supervision and 24 hour management of active medical problems listed below. Physiatrist and rehab team continue to assess barriers to discharge/monitor patient progress toward functional and medical goals. FIM: FIM - Bathing Bathing Steps Patient Completed: Chest;Right Arm;Left Arm;Abdomen Bathing: 2: Max-Patient completes 3-4 88f 10 parts or 25-49%  FIM - Upper Body Dressing/Undressing Upper body dressing/undressing steps patient completed: Thread/unthread right sleeve of pullover shirt/dresss;Thread/unthread left sleeve of pullover shirt/dress;Put head through opening of pull over shirt/dress Upper body dressing/undressing: 4: Min-Patient completed 75 plus % of tasks FIM - Lower Body Dressing/Undressing Lower body dressing/undressing: 1: Total-Patient completed less than 25% of tasks  FIM - Toileting Toileting: 1: Total-Patient completed zero steps, helper did all 3  FIM - Air cabin crew Transfers: 0-Activity did not occur  FIM - Control and instrumentation engineer Devices: Sliding board;Arm rests Bed/Chair Transfer: 1: Two helpers  FIM - Locomotion: Wheelchair Distance: 100 Locomotion: Wheelchair: 2: Travels 89 - 149 ft with minimal assistance (Pt.>75%) FIM - Locomotion: Ambulation Locomotion: Ambulation: 0: Activity did not occur  Comprehension Comprehension Mode: Auditory Comprehension: 4-Understands basic 75 - 89% of the time/requires cueing 10 - 24% of the time  Expression Expression Mode: Verbal Expression: 5-Expresses basic 90% of the time/requires cueing < 10% of the time.  Social Interaction Social Interaction: 4-Interacts appropriately 75 - 89% of the time - Needs redirection for appropriate language or to initiate interaction.  Problem Solving Problem Solving: 5-Solves basic 90% of the time/requires cueing < 10% of the time  Memory Memory: 4-Recognizes or recalls 75 - 89% of the time/requires  cueing 10 - 24% of the time Medical Problem List and Plan:  1. Functional deficits secondary to T10 fracture with complete paraplegia after a fall. Status post T8-T12 fusion laminectomy decompression  2. DVT Prophylaxis/Anticoagulation:   lovenox 40q12. Dopplers neg 3. Pain Management: Hydrocodone and Robaxin as needed. Monitor with increased mobility  4. Mood/anxiety: Xanax as needed. Provide emotional support   - neuropsych to see this pt as well 5. Neuropsych: This patient is capable of making decisions on her own behalf.  6. Skin/Wound Care: Routine skin checks of surgical site and pressure areas  7. Hypertension/atrial fibrillation. Presently on Cardizem 60 mg 4 times a day. Cardiac rate control. A chest pain or shortness of breath.  8. Diabetes mellitus with peripheral neuropathy. Hemoglobin A1c 7.9. Presently with sliding scale insulin. Check blood sugars a.c. and at bedtime/  -increased glucophage to 500mg  bid  -added scheduled lantus YESTERDAY   -continue CM diet  9. Neurogenic bowel and bladder: timed bowel program, foley for now---consider voiding trial but given severity of injury, her prognosis for return is poor.  10. Ileus: resolved  -continue daily bowel program---scheduled supp in am---discussed with patient  -dc'ed reglan as she still having loose stools  -dc'ed miralax, continue has senna s        LOS (Days) 7 A FACE TO FACE EVALUATION WAS PERFORMED  Alyan Hartline T 03/02/2014 8:21 AM

## 2014-03-02 NOTE — Progress Notes (Signed)
Occupational Therapy Session Notes  Patient Details  Name: Yolanda Solis MRN: [id number] Date of Birth: 05-Jan-1934  Today's Date: 03/02/2014 OT Individual Time: 1000-1100 and 100-130 (60 min co-tx w/ PT 100-200) OT Individual Time Calculation (min): 60 min and 30 min OT   Short Term Goals: Week 1:  OT Short Term Goal 1 (Week 1): Pt will complete bed rolls with mod assist to manage legs during assisted BADL OT Short Term Goal 2 (Week 1): Pt will demo ability to wash upper body with min assist for thoroughness while supine in bed OT Short Term Goal 3 (Week 1): Pt will demo abiltiy to perform lateral leans for toilet hygiene with mod assist to manage legs OT Short Term Goal 4 (Week 1): Pt will demo ability to don upper body clothing with HOB elevated and mod assist OT Short Term Goal 5 (Week 1): Pt will perform 4 of 5 grooming tasks at w/c level with setup  Skilled Therapeutic Interventions/Progress Updates:  1)  Patient resting in bed upon arrival.  Engaged in self care retraining to include sponge bath, dressing and grooming.  Focused session on patient's ability to guide caregivers during BADL/donn TLSO/use of lift equipment, adaptive techniques, bed mobility, back precautions during all mobility.  Currently, patient requires occasional +2 for mobility and to don TLSO, and total assist for LB bath and dress.  Patient noted to have loose and watery stool accident which required extra cleaning.  Otherwise, patient could have assisted with washing peri area with HOB up ~30 degrees.  Called patient's son to request clothes to be brought in that are large and stretchy and no shorts.  Patient has 1 pair of pants that she stated were too tight and they were not stretchy and another pair just like it that was dirty.  Son stated that he thought we wash patient's clothes.  Recommended that family either take clothes home or they can use our washer/dryer and soap to was clothes when they visit.  Patient  washed her hair with waterless shampoo cap and was placed in Maxi Sky sling to prepare for hand off to PT.  2)  Patient up in w/c upon arrival. Engaged in oral care at at sink, propelled w/c to gym, and therapeutic activities in therapy gym.  Focused session on lateral leans to prepare for the tasks of toileting, placing and removing a pad to slide on during slide board transfers when skin might come in contact with the slide board, and as an option for pressure relief technique,  improved speed and technique when propelling w/c, steps necessary for a safe and successful w/c><therapy mat sliding board transfer, sit><stand with standing frame with blood pressure monitored.  Patient with increased pain and a pinching feeling during lateral leans and patient is further limited due to feal of falling.  Patient Patient reported feeling nauseated during standing therefore sat back down and blood pressure back to with normal limits.  Patient easily distracted in therapy gym and required redirection.  Therapy Documentation Precautions:  Precautions Precautions: Fall;Back Required Braces or Orthoses: Spinal Brace Spinal Brace: Thoracolumbosacral orthotic;Applied in supine position Restrictions Weight Bearing Restrictions: No Pain: 1)  Back pain, not rated, RN notified and medication to be given 2)  3/10 low back pain, premedicated, rest and repositioned ADL: See FIM for current functional status  Therapy/Group: Individual Therapy in AM and Co-Treat with PT in PM  Hal Norrington 03/02/2014, 7:58 AM

## 2014-03-02 NOTE — Progress Notes (Signed)
Physical Therapy Session Note  Patient Details  Name: Yolanda Solis MRN: [id number] Date of Birth: 1933/09/24  Today's Date: 03/02/2014 PT Co-Treatment Time: 1330-1400 ([facility]- treatment time 1300-1400 with OT)  Short Term Goals: Week 1:  PT Short Term Goal 1 (Week 1): Patient will assist with rolling side to side using bedrails in preparation for supine to sit.  PT Short Term Goal 2 (Week 1): Patient will propell wheelchair 50 feet on level surfaces using bilateral UE's PT Short Term Goal 3 (Week 1): Patient will perform dynamic sitting activity edge of mat x 2 minutes with min assist. PT Short Term Goal 4 (Week 1): Patient will be able to verbally instruct set up for sliding board transfer.  Skilled Therapeutic Interventions/Progress Updates:    Neuromuscular Reeducation: PT instructs pt in standing frame activity x 5 minutes total up; standing tolerance ceased due to pt c/o nausea, but no vomiting.  Prior to standing: BP 124/63 Immediately standing: BP 106/76 After 5 minutes standing: BP 100/83 After resuming sitting: BP 128/87 PT instructs pt in slideboard transfer with low step under feet, mat to w/c to R, having pt explain how to set up the transfer req max cueing, and the transfer req +2 assist with pillow case under to decrease friction of skin against slideboard. Pt req repeated cues for head-hips relationship, but demonstrates much less apprehension than past week.   Pt demonstrates ability to kick R and L leg when in sit, dangling legs, 2-/5 strength in quads.   Therapy Documentation Precautions:  Precautions Precautions: Fall;Back Required Braces or Orthoses: Spinal Brace Spinal Brace: Thoracolumbosacral orthotic;Applied in supine position Restrictions Weight Bearing Restrictions: No Pain: Pain Assessment Pain Assessment: 0-10 Pain Score: 3  Pain Type: Surgical pain Pain Location: Back Pain Orientation: Lower Pain Descriptors / Indicators: Aching Pain Frequency:  Intermittent Pain Onset: On-going Patients Stated Pain Goal: 3 Pain Intervention(s): Rest Multiple Pain Sites: No  See FIM for current functional status  Therapy/Group: Co-Treatment  Timmia Cogburn M 03/02/2014, 1:07 PM

## 2014-03-02 NOTE — Patient Care Conference (Signed)
Inpatient RehabilitationTeam Conference and Plan of Care Update Date: 03/01/2014   Time: 2:30 PM    Patient Name: Yolanda Solis      Medical Record Number: [id number]  Date of Birth: 07-08-1933 Sex: Female         Room/Bed: 4W15C/4W15C-01 Payor Info: Payor: [employer] MEDICARE / Plan: AARP MEDICARE COMPLETE / Product Type: *No Product type* /    Admitting Diagnosis: t10 para  Admit Date/Time:  02/23/2014  6:29 PM Admission Comments: No comment available   Primary Diagnosis:  <principal problem not specified> Principal Problem: <principal problem not specified>  Patient Active Problem List   Diagnosis Date Noted  . SCI (spinal cord injury) 02/23/2014  . Fracture dislocation of thoracic spine 02/18/2014  . HTN (hypertension) 02/18/2014  . Type II or unspecified type diabetes mellitus without mention of complication, uncontrolled 02/18/2014  . Fracture of thoracic spine 02/17/2014    Expected Discharge Date: Expected Discharge Date:  (3 weeks)  Team Members Present: Physician leading conference: Dr. Alger Simons Social Worker Present: Lennart Pall, LCSW;Jenny Sopheap Boehle, LCSW Nurse Present: Elliot Cousin, RN PT Present: Raylene Everts, PT;Alison Arnetha Massy, PT OT Present: Salome Spotted, OT;Patricia Lissa Hoard, OT SLP Present: Weston Anna, SLP Other (Discipline and Name): Danne Baxter, RN [facility]) PPS Coordinator present : Daiva Nakayama, RN, CRRN;Becky Alwyn Ren, PT     Current Status/Progress Goal Weekly Team Focus  Medical   t10 sci, incomplete. pain issues. has had some motor return  improve activity tolerance, sci ed  bowel program, pain control   Bowel/Bladder   Foley cath to straight drain. 6AM bowel program LBM 02/28/14  Managed bowel program  Continue with bowel program. Assess for effectiveness   Swallow/Nutrition/ Hydration             ADL's   Mod Assist upper body (UB) BADL, Total Assist lower body (LB), total assist slide board transfers, total assist  toileting  Min Assist UB BADL, Max A LB BADL, Mod A slide board transfers (bed<>w/c & w/c <> BSC)  Bed mobility, static/dynamic sitting balance, pain managment, adapted B & D, transfer skills, improved awareness   Mobility   +2A for SBT, ModA for w/c propulsion, MinA for seated balance  ModA bed mobility and transfers, MaxA for car transfers, (S) for w/c propulsion  Sliding Board Transfers, bed mobility, directing care, wheelchair seating   Communication             Safety/Cognition/ Behavioral Observations            Pain   Vicodin x 2 tabs q 4hrs prn for mild discomfort  <4  Offe pain medication 1hr prior to initial therapy session   Skin   Honeycomb dressing to thoracic incision, CDI, unremarkable  No additional skin breakdown  Monitor for appropriate healing    Rehab Goals Patient on target to meet rehab goals: Yes Rehab Goals Revised: None *See Care Plan and progress notes for long and short-term goals.  Barriers to Discharge: severe injury, awareness of deficits    Possible Resolutions to Barriers:  pt and family ed. will need more supervision at home    Discharge Planning/Teaching Needs:  Plan is for pt to go to her son's home with 24/7 caregivers.  He is having a ramp built for her and rearranging bedrooms/furniture.  Family will come in for family education closer to d/c.   Team Discussion:  Pt has minimal return in her right leg.  Her ileus is improving, but pain continues to  be a problem.  Pt continues to be max-total assist, sometimes with 2 people.  She is using the slide board some, but mostly the lift.  Nursing is working on establishing a bowel program.  Team is concerned pt will need SNF.  Neuropsychology is recommended and will see pt on 03-03-14.  Pt will be a long length of stay.  Revisions to Treatment Plan:  None   Continued Need for Acute Rehabilitation Level of Care: The patient requires daily medical management by a physician with specialized training in  physical medicine and rehabilitation for the following conditions: Daily direction of a multidisciplinary physical rehabilitation program to ensure safe treatment while eliciting the highest outcome that is of practical value to the patient.: Yes Daily medical management of patient stability for increased activity during participation in an intensive rehabilitation regime.: Yes Daily analysis of laboratory values and/or radiology reports with any subsequent need for medication adjustment of medical intervention for : Neurological problems;Post surgical problems  [name], Silvestre Mesi 03/02/2014, 1:36 PM

## 2014-03-03 ENCOUNTER — Inpatient Hospital Stay (HOSPITAL_COMMUNITY): Payer: Medicare Other

## 2014-03-03 ENCOUNTER — Inpatient Hospital Stay (HOSPITAL_COMMUNITY): Payer: Medicare Other | Admitting: Physical Therapy

## 2014-03-03 ENCOUNTER — Encounter (HOSPITAL_COMMUNITY): Payer: Medicare Other

## 2014-03-03 DIAGNOSIS — IMO0002 Reserved for concepts with insufficient information to code with codable children: Secondary | ICD-10-CM

## 2014-03-03 LAB — GLUCOSE, CAPILLARY
GLUCOSE-CAPILLARY: 172 mg/dL — AB (ref 70–99)
Glucose-Capillary: 213 mg/dL — ABNORMAL HIGH (ref 70–99)
Glucose-Capillary: 190 mg/dL — ABNORMAL HIGH (ref 70–99)
Glucose-Capillary: 176 mg/dL — ABNORMAL HIGH (ref 70–99)

## 2014-03-03 MED ORDER — INSULIN GLARGINE 100 UNIT/ML ~~LOC~~ SOLN
15.0000 [IU] | Freq: Every day | SUBCUTANEOUS | Status: DC
  Administered 2014-03-03 – 2014-03-07 (×5): 15 [IU] via SUBCUTANEOUS
  Filled 2014-03-03 (×6): qty 0.15

## 2014-03-03 NOTE — Progress Notes (Signed)
[facility] PHYSICAL MEDICINE & REHABILITATION     PROGRESS NOTE    Subjective/Complaints: No new issues. Pain an issue A  review of systems has been performed and if not noted above is otherwise negative.   Objective: Vital Signs: Blood pressure 120/50, pulse 82, temperature 97.8 F (36.6 C), temperature source Oral, resp. rate 18, weight 93.214 kg (205 lb 8 oz), SpO2 95.00%. No results found. No results found for this basename: WBC, HGB, HCT, PLT,  in the last 72 hours No results found for this basename: NA, K, CL, CO, GLUCOSE, BUN, CREATININE, CALCIUM,  in the last 72 hours CBG (last 3)   Recent Labs  03/02/14 1558 03/02/14 2126 03/03/14 0658  GLUCAP 245* 168* 172*    Wt Readings from Last 3 Encounters:  03/02/14 93.214 kg (205 lb 8 oz)  02/17/14 94.7 kg (208 lb 12.4 oz)  02/17/14 94.7 kg (208 lb 12.4 oz)    Physical Exam:  Constitutional: She is oriented to person, place, and time. She appears well-developed.  HENT: oral mucosa pink/moist  Head: Normocephalic.  Eyes: EOM are normal.  Neck: Normal range of motion. Neck supple. No thyromegaly present.  Cardiovascular: Normal rate and regular rhythm. No murmurs  Respiratory: Effort normal and breath sounds normal. No respiratory distress. No wheezes  GI: Soft. Bowel sounds are normal. She exhibits less distension with improved bowel sounds. Abdomen still large. Back incision clean.   Neuro: alert and oriented. Cognitively appears appropriate  T12 sensory level---0/2 sensation below the level 0/5 strength LLE. Now with trace movement Right HF, KE, 0 at right ankle. No resting tone.  UE motor 5/5, UE sensory normal. CN intact. Cognition appropriate  Psych: normal   Assessment/Plan: 1. Functional deficits secondary to T10 fx/dislocation with complete SCI which require 3+ hours per day of interdisciplinary therapy in a comprehensive inpatient rehab setting. Physiatrist is providing close team supervision and 24 hour  management of active medical problems listed below. Physiatrist and rehab team continue to assess barriers to discharge/monitor patient progress toward functional and medical goals.  Needs prafo's on at night.  FIM: FIM - Bathing Bathing Steps Patient Completed: Chest;Right Arm;Left Arm;Abdomen Bathing: 2: Max-Patient completes 3-4 54f 10 parts or 25-49%  FIM - Upper Body Dressing/Undressing Upper body dressing/undressing steps patient completed: Thread/unthread right sleeve of pullover shirt/dresss;Thread/unthread left sleeve of pullover shirt/dress;Put head through opening of pull over shirt/dress Upper body dressing/undressing: 4: Min-Patient completed 75 plus % of tasks FIM - Lower Body Dressing/Undressing Lower body dressing/undressing: 1: Total-Patient completed less than 25% of tasks  FIM - Toileting Toileting: 1: Total-Patient completed zero steps, helper did all 3  FIM - Air cabin crew Transfers: 0-Activity did not occur  FIM - Control and instrumentation engineer Devices: Sliding board;Arm rests;Orthosis (tlso) Bed/Chair Transfer: 1: Bed > Chair or W/C: Total A (helper does all/Pt. < 25%);1: Chair or W/C > Bed: Total A (helper does all/Pt. < 25%);1: Two helpers  FIM - Locomotion: Wheelchair Distance: 100 Locomotion: Wheelchair: 2: [place] 46 - 149 ft with supervision, cueing or coaxing FIM - Locomotion: Ambulation Locomotion: Ambulation: 0: Activity did not occur  Comprehension Comprehension Mode: Auditory Comprehension: 4-Understands basic 75 - 89% of the time/requires cueing 10 - 24% of the time  Expression Expression Mode: Verbal Expression: 5-Expresses basic 90% of the time/requires cueing < 10% of the time.  Social Interaction Social Interaction: 4-Interacts appropriately 75 - 89% of the time - Needs redirection for appropriate language or to initiate interaction.  Problem Solving Problem Solving: 5-Solves basic 90% of the time/requires  cueing < 10% of the time  Memory Memory: 4-Recognizes or recalls 75 - 89% of the time/requires cueing 10 - 24% of the time Medical Problem List and Plan:  1. Functional deficits secondary to T10 fracture with complete paraplegia after a fall. Status post T8-T12 fusion laminectomy decompression  2. DVT Prophylaxis/Anticoagulation:   lovenox 40q12. Dopplers neg 3. Pain Management: Hydrocodone and Robaxin as needed. Monitor with increased mobility  4. Mood/anxiety: Xanax as needed. Provide emotional support   - neuropsych to see this pt as well 5. Neuropsych: This patient is capable of making decisions on her own behalf.  6. Skin/Wound Care: Routine skin checks of surgical site and pressure areas  7. Hypertension/atrial fibrillation. Presently on Cardizem 60 mg 4 times a day. Cardiac rate control. A chest pain or shortness of breath.  8. Diabetes mellitus with peripheral neuropathy. Hemoglobin A1c 7.9. Presently with sliding scale insulin. Check blood sugars a.c. and at bedtime/  -increased glucophage to 500mg  bid  -increase scheduled lantus by 5u today   -continue CM diet  9. Neurogenic bowel and bladder: timed bowel program, foley for now---consider voiding trial but given severity of injury, her prognosis for return is poor.  10. Ileus: resolved  -continue daily bowel program---scheduled supp in am---discussed with patient  -dc'ed reglan as she still having loose stools  -dc'ed miralax, continue has senna s        LOS (Days) 8 A FACE TO FACE EVALUATION WAS PERFORMED  SWARTZ,ZACHARY T 03/03/2014 8:45 AM

## 2014-03-03 NOTE — Progress Notes (Signed)
Physical Therapy Weekly Progress Note  Patient Details  Name: Yolanda Solis MRN: [id number] Date of Birth: May 08, 1934  Beginning of progress report period: February 23, 2014 End of progress report period: March 03, 2014  Today's Date: 03/03/2014 PT Individual Time: 1100-1200 Tx 2: 1400-1500 PT Individual Time Calculation (min): 60 min Tx 2: 60 min  Patient has met 3 of 4 short term goals.    Patient continues to demonstrate the following deficits: B LE paraplegia, difficulty with all functional mobility (bed mobility, transfers), inability to ambulate, low activity tolerance, difficulty steering w/c, impaired dynamic sit balance and therefore will continue to benefit from skilled PT intervention to enhance overall performance with activity tolerance, balance, postural control, ability to compensate for deficits, functional use of  right upper extremity and left upper extremity, coordination and knowledge of precautions.  Patient progressing toward long term goals..  Continue plan of care.  PT Short Term Goals Week 1:  PT Short Term Goal 1 (Week 1): Patient will assist with rolling side to side using bedrails in preparation for supine to sit.  PT Short Term Goal 1 - Progress (Week 1): Met PT Short Term Goal 2 (Week 1): Patient will propell wheelchair 50 feet on level surfaces using bilateral UE's PT Short Term Goal 2 - Progress (Week 1): Met PT Short Term Goal 3 (Week 1): Patient will perform dynamic sitting activity edge of mat x 2 minutes with min assist. PT Short Term Goal 3 - Progress (Week 1): Met PT Short Term Goal 4 (Week 1): Patient will be able to verbally instruct set up for sliding board transfer. PT Short Term Goal 4 - Progress (Week 1): Progressing toward goal Week 2:  PT Short Term Goal 1 (Week 2): Pt will propel manual w/c x 150' req SBA and demonstrate ability to lock/unlock brakes.  PT Short Term Goal 2 (Week 2): Pt will be able to verbally instruct set up for  sliding board transfer.  PT Short Term Goal 3 (Week 2): Pt will transfer w/c to/from mat req 1 person assist.  PT Short Term Goal 4 (Week 2): Pt will demonstrate ability to follow commands for trunk leaning during head-hips relationship PT Short Term Goal 5 (Week 2): Pt will demonstrate ability to sit body up in w/c by using arms on armrests to pull body forward   Skilled Therapeutic Interventions/Progress Updates:    Neuro: PT instructs pt in dynamic sitting balance activities edge of mat x 5 minutes total, consisting of passing a small, weighted ball between her own hands and passing it to/from PT's hands. Focus is on obtaining midline posture as pt's tendency is to lean L in static sit, progressing to working on forward weight shifts and weight shifts to the R.   Therapeutic Activity: PT instructs pt in scoot transfer with SB from w/c to/from mat req +2 assist and significant amount of increased time. Pt is very fearful and has difficulty following commands when fear is present. Focus of this activity is performing head-hips relationship and pushing with UEs to scoot. Pt is unable to verbalize full set up of slideboard transfer; is only able to tell PT to put the board under her and swing back the correct armrest (forgot legrests, foley bag, step under B feet, scooting forward).   W/C Management: PT instructs pt in w/c propulsion with B UEs mostly SBA but occasional min A req when pt steers too close to walls x 150' with B UEs.   Tx 2:  Therapeutic Activity: PT instructs pt in rolling R and L with bedrail req mod A each direction with PT crossing legs at the ankles and then assisting in the roll at the hip, while pt utilizes B UE to pull on the rail. X 5 reps each direction.   Therapeutic Exercise: PT instructs pt in B LE ROM and strengthening exercises: PROM mostly hip flexion with heel slides on ascent and mod resisted on R descent and min resisted on L during descent x 10 reps each,  attempted bridges x 10 reps, P/AAROM hip IR in B hook lie x 10 reps, PROM L ankle DF x 10 reps, AAROM R ankle DF x 10 reps, PROM supine hip abduction/AAROM adduction x 10 reps, AAROM R SAQ and PROM L SAQ x 10 reps, PROM clam shells x 10 reps B, PROM Bhip flexion in gravity-minimized side lie x 10 reps B, PROM L and AAROM R knee flexion in gravity minimized side lie x 10 reps   Pt demonstrates activity in R and L hip extensors (R>L), trace movement into hip IR during B hook lie exercise, partial R toe extension and ankle DF, trace movements into hip adduction on R side, trace R quadriceps, and 2-/5 R knee flexion.  Pt also demonstrates emerging extensor tone in L LE.  Cont per PT POC.    Therapy Documentation Precautions:  Precautions Precautions: Fall;Back Required Braces or Orthoses: Spinal Brace Spinal Brace: Thoracolumbosacral orthotic;Applied in supine position Restrictions Weight Bearing Restrictions: No Pain: Pain Assessment Pain Assessment: 0-10 Pain Score: 6  Pain Type: Surgical pain Pain Location: Back Pain Orientation: Lower Pain Descriptors / Indicators: Aching Pain Frequency: Intermittent Pain Onset: On-going Patients Stated Pain Goal: 4 Pain Intervention(s): Medication (See eMAR) Multiple Pain Sites: No  See FIM for current functional status  Therapy/Group: Individual Therapy with Linus Orn PT Tech as +2 first session.   [facility] M 03/03/2014, 1:00 PM

## 2014-03-03 NOTE — Progress Notes (Signed)
NUTRITION FOLLOW-UP  DOCUMENTATION CODES Per approved criteria  -Obesity Unspecified   INTERVENTION: Encourage PO intake.  NUTRITION DIAGNOSIS: Increased nutrient needs related to spinal cord injury as evidenced by estimated nutrition needs; ongoing  Goal: Pt to meet >/= 90% of their estimated nutrition needs, met  Monitor:  PO intake, weight trends, labs, I/O's  80 y.o. female  Admitting Dx: Spinal cord injury  ASSESSMENT: Pt with the diagnosis of T10 spinal fx with SCI, paraplegia. loss of BLE sensation and motor function after a fall.  9/4-Pt is on a full liquid diet at this time due to ileus. Meal completion is 100%. Pt reports she usually has a good appetite, however at this time she has not been feeling hungry due to her ileus and not being able to make a full BM. Spoke with RN, she reports pt has not been able to fully make a BM in 2 weeks. They are giving an enema to pt today to help with the relief. Pt reports no recent weight loss. Pt however is identified by a low braden score, which puts her in risk of skin breakdown. Pt reports she is willing to try glucerna, but would not like it now due to her ileus. Will revisit next time and order glucerna to pt's liking. Pt with no observed significant fat or muscle mass loss.  9/10- Ileus has resolved. Pt reports no stomach pains. Pt reports her appetite is "ok". Pt reports she has been feeling nauseous at times. Meal completion has varied from 15-100%. Pt refused supplements. Pt reports when she is not nauseous, she eats great. Pt was encouraged to eat her food at meals.  Glucose: 168-228 mg/dL  Height: Ht Readings from Last 1 Encounters:  02/17/14 '5\' 4"'  (1.626 m)   Weight: Wt Readings from Last 1 Encounters:  03/02/14 205 lb 8 oz (93.214 kg)   BMI:  Body mass index is 35.26 kg/(m^2). Class II obesity  Re-Estimated Nutritional Needs: Kcal: 1800-2100 Protein: 80-90 grams Fluid: 1.8 - 2.1 L/day  Skin: +2 RLE, +2 LLE  edema  Diet Order: Carb Control    Intake/Output Summary (Last 24 hours) at 03/03/14 1645 Last data filed at 03/03/14 0800  Gross per 24 hour  Intake    170 ml  Output    350 ml  Net   -180 ml    Last BM: 9/9  Labs:   Recent Labs Lab 02/28/14 0540  NA 139  K 5.3  CL 99  CO2 28  BUN 11  CREATININE 0.93  CALCIUM 9.0  GLUCOSE 228*    CBG (last 3)   Recent Labs  03/02/14 2126 03/03/14 0658 03/03/14 1153  GLUCAP 168* 172* 213*    Scheduled Meds: . antiseptic oral rinse  7 mL Mouth Rinse BID  . bisacodyl  10 mg Rectal Q0600  . diltiazem  60 mg Oral 4 times per day  . fentaNYL  12.5 mcg Transdermal Q72H  . heparin  5,000 Units Subcutaneous 3 times per day  . insulin aspart  0-15 Units Subcutaneous TID WC  . insulin aspart  0-5 Units Subcutaneous QHS  . insulin glargine  15 Units Subcutaneous QHS  . loratadine  10 mg Oral Daily  . metFORMIN  500 mg Oral BID WC  . pantoprazole  40 mg Oral Q1200  . senna-docusate  2 tablet Oral QHS    Continuous Infusions:    Past Medical History  Diagnosis Date  . Diabetes mellitus   . Hypertension   .  Arthritis     Past Surgical History  Procedure Laterality Date  . Abdominal hysterectomy    . Posterior lumbar fusion 4 level N/A 02/17/2014    Procedure: Thoracolumbar Repair;  Surgeon: Erline Levine, MD;  Location: [facility];  Service: Neurosurgery;  Laterality: N/A;  Thoracolumbar Repair T8 - T12    Kallie Locks, MS, Provisional LDN Pager # [phone] After hours/ weekend pager # [phone]

## 2014-03-03 NOTE — Progress Notes (Signed)
Occupational Therapy Session and Weekly Progress Note  Patient Details  Name: Yolanda Solis MRN: [id number] Date of Birth: [birth year]  Beginning of progress report period: February 23, 2014 End of progress report period: March 02, 2014  Today's Date: 03/04/2014 OT Individual Time: 1000-1100 OT Individual Time Calculation (min): 60 min    Patient has met 2 of 5 short term goals.  Pt is progressing toward 2 additional goals (grooming, bed rolls) but not progressing with toileting.  Patient continues to demonstrate the following deficits: Impaired static/dynamic sitting balance, impaired transfers, BLE weakness, impaired emergent awareness and therefore will continue to benefit from skilled OT intervention to enhance overall performance with BADL and Reduce care partner burden.  Patient not progressing toward long term goals.  See goal revision..  Plan of care revisions: Discontinue toileting goals d/t incontinence..  OT Short Term Goals Week 1:  OT Short Term Goal 1 (Week 1): Pt will complete bed rolls with mod assist to manage legs during assisted BADL OT Short Term Goal 1 - Progress (Week 1): Progressing toward goal OT Short Term Goal 2 (Week 1): Pt will demo ability to wash upper body with min assist for thoroughness while supine in bed OT Short Term Goal 2 - Progress (Week 1): Met OT Short Term Goal 3 (Week 1): Pt will demo abiltiy to perform lateral leans for toilet hygiene with mod assist to manage legs OT Short Term Goal 3 - Progress (Week 1): Not progressing OT Short Term Goal 4 (Week 1): Pt will demo ability to don upper body clothing with HOB elevated and mod assist OT Short Term Goal 4 - Progress (Week 1): Met OT Short Term Goal 5 (Week 1): Pt will perform 4 of 5 grooming tasks at w/c level with setup OT Short Term Goal 5 - Progress (Week 1): Met Week 2:  OT Short Term Goal 1 (Week 2): Pt will complete bed rolls with mod assist to manage legs during assisted BADL OT Short  Term Goal 2 (Week 2): Pt will incorporate use of appropriate AE to improve participation in lower body BADL OT Short Term Goal 3 (Week 2): Pt will demonstrate ability to direct caregivers to assist in pressure relief while maintaining back precautions 100% of the time OT Short Term Goal 4 (Week 2): Pt will retreive beverage or food item from refrigerator at w/c level using AE, prn OT Short Term Goal 5 (Week 2): Pt will demo ability to perform HEP for 30 minutes to mprove endurance and UE strengthening with supervision  Skilled Therapeutic Interventions/Progress Updates: ADL-retraining with focus on improved participation with assisted bathing in bed, static/dynamic sitting balance, and slide board transfer training.   Pt received supine in bed receptive for assisted ADL with +2 to maintain post-op precautions during functional tasks.   Pt re-educated on need for caregivers to perform assisted log-rolls during bed mobility and to avoid trunk rotation while reaching for bed rails.   Pt required mod verbal cues to sustain intellectual awareness by openning her eyes during assist with bed mobility and to adopt a more directive role with caregivers.   Pt was unable to sustain grasp on bed rails 3 times during assisted lower body bathing resulting in loss of trunk support briefly but she now completes upper body bathing with min assist while lower body bathing requires total assist, +1.   Pt required total-max assist to rise from supine to sitting at edge of bed, supported.    With repetition, pt was  able to perform lateral and anterior/posterior leans at edge of bed while maintained two arms contact with therapist.   Although fearful of slide board transfer, pt completed total assist transfer (+2 only to stabilize w/c) from edge of bed to w/c, contributing approx 20% by reaching for arm rest when cued and performing lateral leans as needed.   Pt left in w/c at end of session with safety belt affixed and call light  placed within reach.    Therapy Documentation Precautions:  Precautions Precautions: Fall;Back Required Braces or Orthoses: Spinal Brace Spinal Brace: Thoracolumbosacral orthotic;Applied in supine position Restrictions Weight Bearing Restrictions: No  Vital Signs: Therapy Vitals Temp: 98.9 F (37.2 C) Temp src: Oral Pulse Rate: 81 Resp: 18 BP: 139/59 mmHg Patient Position (if appropriate): Lying Oxygen Therapy SpO2: 93 % O2 Device: None (Room air)  Pain: Pain Assessment Pain Assessment: No/denies pain (premedicated prior to therapy) Pain Score: 0-No pain Pain Intervention(s): Medication (See eMAR)  ADL: ADL ADL Comments: see FIM  See FIM for current functional status  Therapy/Group: Individual Therapy  Second session: Time: 1300-1400 Time Calculation (min): 60 min  Pain Assessment: No pain  Skilled Therapeutic Interventions: Therex x 15 min for general endurance using Sci-Fit (arm ergometry), level 1, random routine.   Pt demo'd good carry over and was able to sustain attention to perform task for 10 min continuously, equivalent distance = .8 mile.    Therapeutic activity using standing frame for 12 minutes continuously although requiring extensive setup and repositioning for comfort.   Pt returned to bed using Maxi-sky in prep for planned physical therapy session supine in bed.   See FIM for current functional status  Therapy/Group: Individual Therapy  Lynze Reddy 03/04/2014, 7:05 AM

## 2014-03-04 ENCOUNTER — Inpatient Hospital Stay (HOSPITAL_COMMUNITY): Payer: Medicare Other

## 2014-03-04 ENCOUNTER — Inpatient Hospital Stay (HOSPITAL_COMMUNITY): Payer: Medicare Other | Admitting: *Deleted

## 2014-03-04 DIAGNOSIS — E1165 Type 2 diabetes mellitus with hyperglycemia: Secondary | ICD-10-CM

## 2014-03-04 DIAGNOSIS — W19XXXA Unspecified fall, initial encounter: Secondary | ICD-10-CM

## 2014-03-04 DIAGNOSIS — Z5189 Encounter for other specified aftercare: Secondary | ICD-10-CM

## 2014-03-04 DIAGNOSIS — IMO0002 Reserved for concepts with insufficient information to code with codable children: Secondary | ICD-10-CM

## 2014-03-04 DIAGNOSIS — IMO0001 Reserved for inherently not codable concepts without codable children: Secondary | ICD-10-CM

## 2014-03-04 LAB — GLUCOSE, CAPILLARY
GLUCOSE-CAPILLARY: 194 mg/dL — AB (ref 70–99)
GLUCOSE-CAPILLARY: 167 mg/dL — AB (ref 70–99)
Glucose-Capillary: 223 mg/dL — ABNORMAL HIGH (ref 70–99)
Glucose-Capillary: 210 mg/dL — ABNORMAL HIGH (ref 70–99)

## 2014-03-04 MED ORDER — SENNOSIDES-DOCUSATE SODIUM 8.6-50 MG PO TABS
1.0000 | ORAL_TABLET | Freq: Every day | ORAL | Status: DC
  Administered 2014-03-04 – 2014-03-06 (×3): 1 via ORAL
  Filled 2014-03-04 (×3): qty 1

## 2014-03-04 NOTE — Progress Notes (Signed)
Physical Therapy Session Note  Patient Details  Name: Yolanda Solis MRN: [id number] Date of Birth: Sep 30, 1933  Today's Date: 03/04/2014 PT Individual Time:  10:00-11:00 (40min)    Short Term Goals: Week 2:  PT Short Term Goal 1 (Week 2): Pt will propel manual w/c x 150' req SBA and demonstrate ability to lock/unlock brakes.  PT Short Term Goal 2 (Week 2): Pt will be able to verbally instruct set up for sliding board transfer.  PT Short Term Goal 3 (Week 2): Pt will transfer w/c to/from mat req 1 person assist.  PT Short Term Goal 4 (Week 2): Pt will demonstrate ability to follow commands for trunk leaning during head-hips relationship PT Short Term Goal 5 (Week 2): Pt will demonstrate ability to sit body up in w/c by using arms on armrests to pull body forward   Skilled Therapeutic Interventions/Progress Updates:  Tx focused on threx for strengthening and activity tolerance, transfer training, and sitting balance. Pt c/o foor "brning sensation," but no other pain. Pt needed rest breaks throughout for activity tolerance. Reinforced education on increasing self-directing cares and participation in mobility tasks.   Attempted kinetron to see if any ext activation was possible, but only AAROM available x3 min in seated position.  Performed standing frame for increased LE weight bearing and activity tolernace x75min with UE clothes clip task for increased UE reach and trunk ext.   Performed SB transfer with +2 and total assist for parts and set-up. Pt encouraged however to direct self cares and was able to remember needing to move arm rests, "put that plank somewhere," lean opposite direction, and place step under feet (with questioning cue). Pt able to assist ~10% with hip advancement WC<>mat.   Performed unsupported static sitting with hands on knees to increase comfort with neutral/anterior trunk position x57min.  Performed dynamic sitting, reaching for horseshoes in upward and anterior  directions with cues for trunk lengthening x79min with improved performance with practice.   Performed scooting back in WC x3 throughout tx with cues for trunk positioning and assist as able, which also improved with encouragement and practice.   Pt brought back to room, left up with all needs in reach.       Therapy Documentation Precautions:  Precautions Precautions: Fall;Back Required Braces or Orthoses: Spinal Brace Spinal Brace: Thoracolumbosacral orthotic;Applied in supine position Restrictions Weight Bearing Restrictions: No General:   Vital Signs: Therapy Vitals Pulse Rate: 75 BP: 131/52 mmHg Patient Position (if appropriate): Lying Oxygen Therapy SpO2: 98 % Pain: burning foot pain, modified tx.     See FIM for current functional status  Therapy/Group: Individual Therapy  Druanne Bosques, Corinna Lines, PT, DPT\ 03/04/2014, 10:20 AM

## 2014-03-04 NOTE — Progress Notes (Signed)
Patient had vomit episode x 1, small amount, thick green in color. Patient declined anti nausea medication. Provided oral care. Held caradizem  For 30 mins to ensure patient will not vomit again. Will continue to monitor.  Oval Linsey, RN

## 2014-03-04 NOTE — Progress Notes (Signed)
Occupational Therapy Session Note  Patient Details  Name: Yolanda Solis MRN: [id number] Date of Birth: 11/26/1933  Today's Date: 03/04/2014 OT Individual Time: 0900-1000 OT Individual Time Calculation (min): 60 min    Short Term Goals: Week 2:  OT Short Term Goal 1 (Week 2): Pt will complete bed rolls with mod assist to manage legs during assisted BADL OT Short Term Goal 2 (Week 2): Pt will incorporate use of appropriate AE to improve participation in lower body BADL OT Short Term Goal 3 (Week 2): Pt will demonstrate ability to direct caregivers to assist in pressure relief while maintaining back precautions 100% of the time OT Short Term Goal 4 (Week 2): Pt will retreive beverage or food item from refrigerator at w/c level using AE, prn OT Short Term Goal 5 (Week 2): Pt will demo ability to perform HEP for 30 minutes to mprove endurance and UE strengthening with supervision  Skilled Therapeutic Interventions/Progress Updates: ADL-retraining with emphasis on reduced burden of care using only 1 caregiver for assisted bathing/dressing while pt remained supine in bed.   With setup assist pt completed upper body bathing after removing her shirt independently this session prior to bathing.   OT provided total assist for lower body B & D however pt is now able to complete side rolls in bed, rolling either right or left, consistently with only mod A X 1 to bend hips and knees and to direct initiation of log rolling.    Pt required setup assist to don TLSO as she is unable to reach straps or apply orthotic appropriately due to obesity.   Pt benefits from mech lift transfer for single caregiver for safety although she reported less pain this session due to improved awareness of process for mechanical lift transfer.  Pt was placed in her w/c with aid of lift at end of session and was left in w/c to improve static sitting tolerance until next appt, approx 2 hours later.   Call light and phone placed within  reach.     Therapy Documentation Precautions:  Precautions Precautions: Fall;Back Required Braces or Orthoses: Spinal Brace Spinal Brace: Thoracolumbosacral orthotic;Applied in supine position Restrictions Weight Bearing Restrictions: No  Vital Signs: Therapy Vitals Pulse Rate: 75 BP: 131/52 mmHg Patient Position (if appropriate): Lying Oxygen Therapy SpO2: 98 %  Pain: 2/10   ADL: ADL ADL Comments: see FIM  See FIM for current functional status  Therapy/Group: Individual Therapy  Second session: Time: 1300-1400 Time Calculation (min):  60 min  Pain Assessment: 6/10, back and underarms from TLSO, repositioned for comfort  Skilled Therapeutic Interventions: ADL-retraining with focus on w/c propulsion within room and seated grooming at sink.   Pt received seated in w/c with complaint of pain and low back and axilla from prolonged sitting with TLSO (approx 2 hours, from 11 am - 1 pm).  Pt was encouraged to propel w/c to sink and complete grooming after her lunch.   With setup assist pt washed face, hands, brushed her hair, and swabbed her gums with foam brush and mouth wash rinse.   Pt then propelled w/c back to edge of bed for mech assist lift back to bed to continue therapy supine.   Therapeutic activities with emphasis on improved awareness of caregiver roles, use of DME, and repositioning for improved comfort while wearing TLSO.   Pt was educated on precautions and attention to caregivers during safe use of slings for either electric or manual lift devices as OT recommends use of  mech lift for potential patient and caregivers (couisns?) s/p d/c.      While supine in bed, TLSO was removed to allow care and cleansing of axilla with powder and wash cloth.   Pt cleaned armpits with washcloth and OT applied power to affected areas which were mildly irritated.   Pt then performed 15 minutes of supine therex using thera-band (orange, grade II) and 5 lbs weighted bar.   Pt performed  scapular chest pulls, straight arm pull downs, chest press, and bicep curls, completing 3 sets of 10 reps for each arm during curls and arm pull downs, and 3 sets of 10 reps with BUE for chest pulls and chest press.   Pt demo'd good attention to task and performed exercises with close supervision and intermittent manual assist to perform exercise correctly.   TLSO re-applied at end of session; pt left supine in bed with call light and phone within reach.      See FIM for current functional status  Therapy/Group: Individual Therapy  Ericca Labra 03/04/2014, 11:19 AM

## 2014-03-04 NOTE — Progress Notes (Signed)
Physical Therapy Session Note  Patient Details  Name: Yolanda Solis MRN: [id number] Date of Birth: [birth year]  Today's Date: 03/04/2014 PT Co-Treatment Time: 1583-0940 (Co-tx w/ OT for total time 1500-1600) PT Co-Treatment Time Calculation (min): 30 min  Short Term Goals: Week 2:  PT Short Term Goal 1 (Week 2): Pt will propel manual w/c x 150' req SBA and demonstrate ability to lock/unlock brakes.  PT Short Term Goal 2 (Week 2): Pt will be able to verbally instruct set up for sliding board transfer.  PT Short Term Goal 3 (Week 2): Pt will transfer w/c to/from mat req 1 person assist.  PT Short Term Goal 4 (Week 2): Pt will demonstrate ability to follow commands for trunk leaning during head-hips relationship PT Short Term Goal 5 (Week 2): Pt will demonstrate ability to sit body up in w/c by using arms on armrests to pull body forward   Skilled Therapeutic Interventions/Progress Updates:  Pt received supine in bed, ready for therapy. Co-tx this session w/ OT for emphasis on SBTs, functional w/c propulsion, standing tolerance and B weight shifts. Pt req total Ax2persons for SBT bed>w/c<>tx mat w/ use of 6" step for B LE support. Pt very anxious during unsupported sitting/transfers and becomes dependent vs. Attempting to actively use B UE for assistance. While sitting EOM, emphasis on B weight shifts by placing cards under bottom, cued to slide hand along mat away from body for increased lean. Attempted standing tolerance initially w/ use of Sara +, but increase pt's pain. Pt with excellent tolerance to standing frame x10' w/ pt engaged in multiple rounds of tic-tac-toe to encourage upright posture w/ no reports of dizziness. Pt req min A for w/c propulsion 75'x1 and 100'x1 w/ B UE, demonstrating slow pace. Pt assisted back to bed at end of session via maxi move due to fatigue. Pt left semi-reclined in bed w/ all needs in reach, in care of nurse tech.   Therapy Documentation Precautions:   Precautions Precautions: Fall;Back Required Braces or Orthoses: Spinal Brace Spinal Brace: Thoracolumbosacral orthotic;Applied in supine position Restrictions Weight Bearing Restrictions: No Pain: Pain Assessment Pain Assessment: 0-10 Pain Score: 5  Pain Type: Surgical pain Pain Location: Back Pain Orientation: Lower Pain Radiating Towards: left leg Pain Descriptors / Indicators: Burning Pain Frequency: Intermittent Pain Onset: On-going Patients Stated Pain Goal: 4 Pain Intervention(s): Medication (See eMAR)  See FIM for current functional status  Therapy/Group: Co-Treatment  Otis Brace S 03/04/2014, 4:11 PM

## 2014-03-04 NOTE — Progress Notes (Signed)
Occupational Therapy Session Note  Patient Details  Name: Yolanda Solis MRN: [id number] Date of Birth: 1933-11-24  Today's Date: 03/04/2014 OT Co-Treatment Time: 1530-1600  Co treatment with PT from 1500-1600 OT Co-Treatment Time Calculation (min): 30 min   Short Term Goals: Week 2:  OT Short Term Goal 1 (Week 2): Pt will complete bed rolls with mod assist to manage legs during assisted BADL OT Short Term Goal 2 (Week 2): Pt will incorporate use of appropriate AE to improve participation in lower body BADL OT Short Term Goal 3 (Week 2): Pt will demonstrate ability to direct caregivers to assist in pressure relief while maintaining back precautions 100% of the time OT Short Term Goal 4 (Week 2): Pt will retreive beverage or food item from refrigerator at w/c level using AE, prn OT Short Term Goal 5 (Week 2): Pt will demo ability to perform HEP for 30 minutes to mprove endurance and UE strengthening with supervision  Skilled Therapeutic Interventions/Progress Updates:  Pt supine in bed upon entering the room. Pt with 5/10 c/o pain in lower back described as burning pain that is radiating down L LE. Pt seen with PT for co treatment with focus on functional transfers utilizing sliding board, lateral leans, and standing tolerance. Pt required total A of 2 for SBT bed>wheelchair and also later to mat. A step utilized once seated at mat in order to ensure comfort. Pt very anxious over standing and sitting balance secondary to fear of falling and required coaxing for initial participation. Seated on edge of mat but placing cards under buttocks in order to lean towards L and R to simulate leans for LB dressing and toileting. Pt required frequent verbal cues for hand placement. Clarise Cruz + attempted but pt experienced increased pain. Standing frame ~ 10 minutes to comb hair in mirror as well as engage in table top activity for standing tolerance. Pt assisted to bed via maxi move from RN techs secondary to  fatigue at end of session.   Therapy Documentation Precautions:  Precautions Precautions: Fall;Back Required Braces or Orthoses: Spinal Brace Spinal Brace: Thoracolumbosacral orthotic;Applied in supine position Restrictions Weight Bearing Restrictions: No  ADL: ADL ADL Comments: see FIM  See FIM for current functional status  Therapy/Group: Co-Treatment  Abner Greenspan, Jarrin Staley L 03/04/2014, 5:49 PM

## 2014-03-04 NOTE — Progress Notes (Signed)
[facility]IC[facility] & RE[facility]ABILITATION     PROGRESS NOTE    Subjective/Complaints: Occasional loose stool A  review of systems has been performed and if not noted above is otherwise negative.   Objective: Vital Signs: Blood pressure 139/59, pulse 81, temperature 98.9 F (37.2 C), temperature source Oral, resp. rate 18, weight 93.214 kg (205 lb 8 oz), SpO2 93.00%. No results found. No results found for this basename: WBC, HGB, HCT, PLT,  in the last 72 hours No results found for this basename: NA, K, CL, CO, GLUCOSE, BUN, CREATININE, CALCIUM,  in the last 72 hours CBG (last 3)   Recent Labs  03/03/14 1650 03/03/14 2104 03/04/14 0718  GLUCAP 190* 176* 223*    Wt Readings from Last 3 Encounters:  03/02/14 93.214 kg (205 lb 8 oz)  02/17/14 94.7 kg (208 lb 12.4 oz)  02/17/14 94.7 kg (208 lb 12.4 oz)    Physical Exam:  Constitutional: She is oriented to person, place, and time. She appears well-developed.  HENT: oral mucosa pink/moist  Head: Normocephalic.  Eyes: EOM are normal.  Neck: Normal range of motion. Neck supple. No thyromegaly present.  Cardiovascular: Normal rate and regular rhythm. No murmurs  Respiratory: Effort normal and breath sounds normal. No respiratory distress. No wheezes  GI: Soft. Bowel sounds are normal. She exhibits less distension with improved bowel sounds. Abdomen still large. Back incision clean.   Neuro: alert and oriented. Cognitively appears appropriate  T12 sensory level---0/2 sensation below the level 0/5 strength LLE. Now with trace movement Right HF, KE, 0 at right ankle. No resting tone.  UE motor 5/5, UE sensory normal. CN intact. Cognition appropriate  Psych: normal   Assessment/Plan: 1. Functional deficits secondary to T10 fx/dislocation with complete SCI which require 3+ hours per day of interdisciplinary therapy in a comprehensive inpatient rehab setting. Physiatrist is providing close team supervision and 24 hour  management of active medical problems listed below. Physiatrist and rehab team continue to assess barriers to discharge/monitor patient progress toward functional and medical goals.  Needs prafo's on at night.  FIM: FIM - Bathing Bathing Steps Patient Completed: Chest;Right Arm;Left Arm;Abdomen Bathing: 2: Max-Patient completes 3-4 69f 10 parts or 25-49%  FIM - Upper Body Dressing/Undressing Upper body dressing/undressing steps patient completed: Thread/unthread right sleeve of pullover shirt/dresss;Thread/unthread left sleeve of pullover shirt/dress;Put head through opening of pull over shirt/dress Upper body dressing/undressing: 4: Min-Patient completed 75 plus % of tasks FIM - Lower Body Dressing/Undressing Lower body dressing/undressing: 1: Total-Patient completed less than 25% of tasks  FIM - Toileting Toileting: 1: Two helpers  FIM - Air cabin crew Transfers: 0-Activity did not occur  FIM - Control and instrumentation engineer Devices: Sliding board;Arm rests;Orthosis (tlso) Bed/Chair Transfer: 1: Two helpers;1: Chair or W/C > Bed: Total A (helper does all/Pt. < 25%);1: Bed > Chair or W/C: Total A (helper does all/Pt. < 25%)  FIM - Locomotion: Wheelchair Distance: 150 Locomotion: Wheelchair: 4: Travels 150 ft or more: maneuvers on rugs and over door sillls with minimal assistance (Pt.>75%) FIM - Locomotion: Ambulation Locomotion: Ambulation: 0: Activity did not occur  Comprehension Comprehension Mode: Auditory Comprehension: 5-Understands basic 90% of the time/requires cueing < 10% of the time  Expression Expression Mode: Verbal Expression: 5-Expresses basic 90% of the time/requires cueing < 10% of the time.  Social Interaction Social Interaction: 5-Interacts appropriately 90% of the time - Needs monitoring or encouragement for participation or interaction.  Problem Solving Problem Solving: 5-Solves basic 90% of the  time/requires cueing < 10% of  the time  Memory Memory: 5-Recognizes or recalls 90% of the time/requires cueing < 10% of the time Medical Problem List and Plan:  1. Functional deficits secondary to T10 fracture with complete paraplegia after a fall. Status post T8-T12 fusion laminectomy decompression  2. DVT Prophylaxis/Anticoagulation:   lovenox 40q12. Dopplers neg 3. Pain Management: Hydrocodone and Robaxin as needed. Monitor with increased mobility  4. Mood/anxiety: Xanax as needed. Provide emotional support   - neuropsych to see this pt as well 5. Neuropsych: This patient is capable of making decisions on her own behalf.  6. Skin/Wound Care: Routine skin checks of surgical site and pressure areas  7. Hypertension/atrial fibrillation. Presently on Cardizem 60 mg 4 times a day. Cardiac rate control. A chest pain or shortness of breath.  8. Diabetes mellitus with peripheral neuropathy. Hemoglobin A1c 7.9. Presently with sliding scale insulin. Check blood sugars a.c. and at bedtime/  -sugars showing improvement  -increased glucophage to 500mg  bid. May need further adjustment  -increase scheduled lantus to 15u qhs---titrate further as needed   -continue CM diet  9. Neurogenic bowel and bladder: timed bowel program, foley for now---consider voiding trial but given severity of injury, her prognosis for return is poor.  10. Ileus: resolved  -working on an am bowel program  -dc'ed reglan as she still having loose stools  -dc'ed miralax, continue has senna s         LOS (Days) 9 A FACE TO FACE EVALUATION WAS PERFORMED  Asberry Lascola T 03/04/2014 8:01 AM

## 2014-03-04 NOTE — Progress Notes (Signed)
Note/chart reviewed.  Jaylia Pettus Barnett RD, LDN Inpatient Clinical Dietitian Pager: [phone] After Hours Pager: [phone]  

## 2014-03-05 ENCOUNTER — Inpatient Hospital Stay (HOSPITAL_COMMUNITY): Payer: Medicare Other | Admitting: *Deleted

## 2014-03-05 DIAGNOSIS — IMO0002 Reserved for concepts with insufficient information to code with codable children: Secondary | ICD-10-CM

## 2014-03-05 DIAGNOSIS — Z5189 Encounter for other specified aftercare: Principal | ICD-10-CM

## 2014-03-05 LAB — GLUCOSE, CAPILLARY
GLUCOSE-CAPILLARY: 170 mg/dL — AB (ref 70–99)
GLUCOSE-CAPILLARY: 194 mg/dL — AB (ref 70–99)
Glucose-Capillary: 210 mg/dL — ABNORMAL HIGH (ref 70–99)
Glucose-Capillary: 256 mg/dL — ABNORMAL HIGH (ref 70–99)

## 2014-03-05 NOTE — Progress Notes (Signed)
Physical Therapy Session Note  Patient Details  Name: Yolanda Solis MRN: [id number] Date of Birth: 1934-04-05  Today's Date: 03/05/2014 PT Individual Time: 6387-5643 PT Individual Time Calculation (min): 30 min    Skilled Therapeutic Interventions/Progress Updates:  Patient in be , being changed. Bed mobility training to assist in donning LB dressing and applying a TSO. Transfer to w/c with maxiSky. Repositioning in w/c. W/C propulsion on level surfaces and on carpet.  Decreased endurance and often rest breaks needed.Patient returned to room performed activities to increase core strength, left in a room with family and QR belt on.   Therapy Documentation Precautions:  Precautions Precautions: Fall;Back Required Braces or Orthoses: Spinal Brace Spinal Brace: Thoracolumbosacral orthotic;Applied in supine position Restrictions Weight Bearing Restrictions: No Vital Signs: Therapy Vitals Temp: 97.8 F (36.6 C) Temp src: Oral Pulse Rate: 85 Resp: 15 BP: 134/87 mmHg Patient Position (if appropriate): Lying Oxygen Therapy SpO2: 98 % O2 Device: None (Room air)  See FIM for current functional status  Therapy/Group: Individual Therapy  [name] 03/05/2014, 4:05 PM

## 2014-03-05 NOTE — Progress Notes (Signed)
Yolanda Solis is a 80 y.o. female 1934-04-04 [id number]  Subjective: No new complaints. Watching TV. Slept well. Feeling OK.  Objective: Vital signs in last 24 hours: Temp:  [97.7 F (36.5 C)-98.6 F (37 C)] 98.6 F (37 C) (09/12 0604) Pulse Rate:  [67-84] 84 (09/12 0604) Resp:  [14-18] 18 (09/12 0604) BP: (121-141)/(45-60) 141/60 mmHg (09/12 0604) SpO2:  [98 %] 98 % (09/12 0604) Weight change:  Last BM Date: 03/05/14  Intake/Output from previous day: 09/11 0701 - 09/12 0700 In: 600 [P.O.:600] Out: 1100 [Urine:1100]  Physical Exam General: No apparent distress   In bed. Lungs: Normal effort. Lungs clear to auscultation, no crackles or wheezes. Cardiovascular: irregular rate and rhythm, no edema   Lab Results: BMET    Component Value Date/Time   NA 139 02/28/2014 0540   K 5.3 02/28/2014 0540   CL 99 02/28/2014 0540   CO2 28 02/28/2014 0540   GLUCOSE 228* 02/28/2014 0540   BUN 11 02/28/2014 0540   CREATININE 0.93 02/28/2014 0540   CALCIUM 9.0 02/28/2014 0540   GFRNONAA 58* 02/28/2014 0540   GFRAA 67* 02/28/2014 0540   CBC    Component Value Date/Time   WBC 15.3* 02/24/2014 0843   RBC 3.95 02/24/2014 0843   HGB 11.8* 02/24/2014 0843   HCT 37.2 02/24/2014 0843   PLT 279 02/24/2014 0843   MCV 94.2 02/24/2014 0843   MCH 29.9 02/24/2014 0843   MCHC 31.7 02/24/2014 0843   RDW 13.7 02/24/2014 0843   LYMPHSABS 1.4 02/24/2014 0843   MONOABS 1.5* 02/24/2014 0843   EOSABS 0.8* 02/24/2014 0843   BASOSABS 0.1 02/24/2014 0843   CBG's (last 3):   Recent Labs  03/04/14 1614 03/04/14 2133 03/05/14 0720  GLUCAP 210* 167* 170*   LFT's Lab Results  Component Value Date   ALT 7 02/24/2014   AST 23 02/24/2014   ALKPHOS 138* 02/24/2014   BILITOT 0.5 02/24/2014    Studies/Results: No results found.  Medications:  I have reviewed the patient's current medications. Scheduled Medications: . antiseptic oral rinse  7 mL Mouth Rinse BID  . bisacodyl  10 mg Rectal Q0600  . diltiazem  60 mg Oral 4 times per day   . fentaNYL  12.5 mcg Transdermal Q72H  . heparin  5,000 Units Subcutaneous 3 times per day  . insulin aspart  0-15 Units Subcutaneous TID WC  . insulin aspart  0-5 Units Subcutaneous QHS  . insulin glargine  15 Units Subcutaneous QHS  . loratadine  10 mg Oral Daily  . metFORMIN  500 mg Oral BID WC  . pantoprazole  40 mg Oral Q1200  . senna-docusate  1 tablet Oral QHS   PRN Medications: acetaminophen, ALPRAZolam, alum & mag hydroxide-simeth, HYDROcodone-acetaminophen, methocarbamol, ondansetron (ZOFRAN) IV, ondansetron, sorbitol  Assessment/Plan: Active Problems:   SCI (spinal cord injury)   1. Functional deficits secondary to T10 fracture with complete paraplegia after a fall. Status post T8-T12 fusion laminectomy decompression  2. DVT Prophylaxis/Anticoagulation: lovenox 40q12. Dopplers neg  3. Pain Management: Hydrocodone and Robaxin as needed. Monitor with increased mobility  4. Mood/anxiety: Xanax as needed. Provide emotional support  - neuropsych to follow  5. Neuropsych: This patient is capable of making decisions on her own behalf.  6. Skin/Wound Care: Routine skin checks of surgical site and pressure areas  7. Hypertension/atrial fibrillation. Presently on Cardizem 60 mg 4 times a day. rate control, no chest pain or shortness of breath.  8. Diabetes mellitus with peripheral neuropathy. Hemoglobin  A1c 7.9.  - on sliding scale insulin, a.c. And hs  - glucophage to 500mg  bid. May need further adjustment  - scheduled lantus, titrate as needed  -continue CM diet  9. Neurogenic bowel and bladder: timed bowel program, foley for now---consider voiding trial but given severity of injury, her prognosis for return function is poor.  10. Ileus: resolved    Length of stay, days: 10    Valerie A. Asa Lente, MD 03/05/2014, 11:36 AM

## 2014-03-06 ENCOUNTER — Inpatient Hospital Stay (HOSPITAL_COMMUNITY): Payer: Medicare Other

## 2014-03-06 LAB — GLUCOSE, CAPILLARY
GLUCOSE-CAPILLARY: 118 mg/dL — AB (ref 70–99)
GLUCOSE-CAPILLARY: 164 mg/dL — AB (ref 70–99)
GLUCOSE-CAPILLARY: 142 mg/dL — AB (ref 70–99)
Glucose-Capillary: 180 mg/dL — ABNORMAL HIGH (ref 70–99)
Glucose-Capillary: 168 mg/dL — ABNORMAL HIGH (ref 70–99)

## 2014-03-06 NOTE — Progress Notes (Signed)
Physical Therapy Session Note  Patient Details  Name: Yolanda Solis MRN: [id number] Date of Birth: 15-Dec-1933  Today's Date: 03/06/2014 PT Individual Time: 7482-7078 PT Individual Time Calculation (min): 60 min   Short Term Goals: Week 2:  PT Short Term Goal 1 (Week 2): Pt will propel manual w/c x 150' req SBA and demonstrate ability to lock/unlock brakes.  PT Short Term Goal 2 (Week 2): Pt will be able to verbally instruct set up for sliding board transfer.  PT Short Term Goal 3 (Week 2): Pt will transfer w/c to/from mat req 1 person assist.  PT Short Term Goal 4 (Week 2): Pt will demonstrate ability to follow commands for trunk leaning during head-hips relationship PT Short Term Goal 5 (Week 2): Pt will demonstrate ability to sit body up in w/c by using arms on armrests to pull body forward   Skilled Therapeutic Interventions/Progress Updates:  Patient in bed upon entering room. Patient requesting a bath prior to getting up. Patient agreed to "wash the hot spots". Patient assisted due to time. Patient had 2 bowel incontinent episodes while attempting to bath and dress patient. Patient worked on rolling side to side multiple times during activity using bed rails and requiring +2 assist. Patient required +2 to don TLSO. Therapists used maxi sky lift to get patient into wheelchair. Patient propelled wheelchair 150 feet on level tile and carpeted surfaces around furniture to simulate home environment. Patient required minimal verbal cueing to allow for obstacles on right. Patient left in wheelchair with all items in reach.  Therapy Documentation Precautions:  Precautions Precautions: Fall;Back Required Braces or Orthoses: Spinal Brace Spinal Brace: Thoracolumbosacral orthotic;Applied in supine position Restrictions Weight Bearing Restrictions: No  Pain: Pain Assessment Pain Assessment: 0-10 Pain Score: 3  Pain Location: Back Pain Orientation: Lower  Locomotion : Wheelchair  Mobility Distance: 150   See FIM for current functional status  Therapy/Group: Individual Therapy  Sanjuana Letters 03/06/2014, 12:44 PM

## 2014-03-06 NOTE — Progress Notes (Signed)
Yolanda Solis is a 80 y.o. female Sep 04, 1933 [id number]  Subjective: No new complaints. Watching TV. Slept well. Feeling OK.  Objective: Vital signs in last 24 hours: Temp:  [97.8 F (36.6 C)-98.4 F (36.9 C)] 98 F (36.7 C) (09/13 0607) Pulse Rate:  [72-85] 72 (09/13 0607) Resp:  [15-18] 18 (09/13 0607) BP: (131-146)/(60-87) 131/60 mmHg (09/13 0607) SpO2:  [95 %-98 %] 98 % (09/13 0607) Weight change:  Last BM Date: 03/05/14  Intake/Output from previous day: 09/12 0701 - 09/13 0700 In: 480 [P.O.:480] Out: 1100 [Urine:1100]  Physical Exam General: No apparent distress   In bed. Lungs: Normal effort. Lungs clear to auscultation, no crackles or wheezes. Cardiovascular: irregular rate and rhythm, no edema   Lab Results: BMET    Component Value Date/Time   NA 139 02/28/2014 0540   K 5.3 02/28/2014 0540   CL 99 02/28/2014 0540   CO2 28 02/28/2014 0540   GLUCOSE 228* 02/28/2014 0540   BUN 11 02/28/2014 0540   CREATININE 0.93 02/28/2014 0540   CALCIUM 9.0 02/28/2014 0540   GFRNONAA 58* 02/28/2014 0540   GFRAA 67* 02/28/2014 0540   CBC    Component Value Date/Time   WBC 15.3* 02/24/2014 0843   RBC 3.95 02/24/2014 0843   HGB 11.8* 02/24/2014 0843   HCT 37.2 02/24/2014 0843   PLT 279 02/24/2014 0843   MCV 94.2 02/24/2014 0843   MCH 29.9 02/24/2014 0843   MCHC 31.7 02/24/2014 0843   RDW 13.7 02/24/2014 0843   LYMPHSABS 1.4 02/24/2014 0843   MONOABS 1.5* 02/24/2014 0843   EOSABS 0.8* 02/24/2014 0843   BASOSABS 0.1 02/24/2014 0843   CBG's (last 3):    Recent Labs  03/05/14 1945 03/05/14 2307 03/06/14 0726  GLUCAP 256* 118* 180*   LFT's Lab Results  Component Value Date   ALT 7 02/24/2014   AST 23 02/24/2014   ALKPHOS 138* 02/24/2014   BILITOT 0.5 02/24/2014    Studies/Results: No results found.  Medications:  I have reviewed the patient's current medications. Scheduled Medications: . antiseptic oral rinse  7 mL Mouth Rinse BID  . bisacodyl  10 mg Rectal Q0600  . diltiazem  60 mg Oral 4 times  per day  . fentaNYL  12.5 mcg Transdermal Q72H  . heparin  5,000 Units Subcutaneous 3 times per day  . insulin aspart  0-15 Units Subcutaneous TID WC  . insulin aspart  0-5 Units Subcutaneous QHS  . insulin glargine  15 Units Subcutaneous QHS  . loratadine  10 mg Oral Daily  . metFORMIN  500 mg Oral BID WC  . pantoprazole  40 mg Oral Q1200  . senna-docusate  1 tablet Oral QHS   PRN Medications: acetaminophen, ALPRAZolam, alum & mag hydroxide-simeth, HYDROcodone-acetaminophen, methocarbamol, ondansetron (ZOFRAN) IV, ondansetron, sorbitol  Assessment/Plan: Active Problems:   SCI (spinal cord injury)   1. Functional deficits secondary to T10 fracture with complete paraplegia after a fall. Status post T8-T12 fusion laminectomy decompression  2. DVT Prophylaxis/Anticoagulation: lovenox 40q12. Dopplers neg  3. Pain Management: Hydrocodone and Robaxin as needed. Monitor with increased mobility  4. Mood/anxiety: Xanax as needed. Provide emotional support  - neuropsych to follow  5. Neuropsych: This patient is capable of making decisions on her own behalf.  6. Skin/Wound Care: Routine skin checks of surgical site and pressure areas  7. Hypertension/atrial fibrillation. Presently on Cardizem 60 mg 4 times a day. rate control, no chest pain or shortness of breath.  8. Diabetes mellitus with peripheral  neuropathy. Hemoglobin A1c 7.9.  - on sliding scale insulin, a.c. And hs  - glucophage to 500mg  bid. May need further adjustment  - scheduled lantus, titrate as needed  -continue CM diet  9. Neurogenic bowel and bladder: timed bowel program, foley for now---consider voiding trial but given severity of injury, her prognosis for return function is poor.  10. Ileus: resolved    Length of stay, days: 11    Yolanda Solis A. Asa Lente, MD 03/06/2014, 10:00 AM

## 2014-03-07 ENCOUNTER — Inpatient Hospital Stay (HOSPITAL_COMMUNITY): Payer: Medicare Other

## 2014-03-07 ENCOUNTER — Inpatient Hospital Stay (HOSPITAL_COMMUNITY): Payer: Medicare Other | Admitting: Physical Therapy

## 2014-03-07 DIAGNOSIS — E1165 Type 2 diabetes mellitus with hyperglycemia: Secondary | ICD-10-CM

## 2014-03-07 DIAGNOSIS — Z5189 Encounter for other specified aftercare: Secondary | ICD-10-CM

## 2014-03-07 DIAGNOSIS — IMO0001 Reserved for inherently not codable concepts without codable children: Secondary | ICD-10-CM

## 2014-03-07 DIAGNOSIS — IMO0002 Reserved for concepts with insufficient information to code with codable children: Secondary | ICD-10-CM

## 2014-03-07 DIAGNOSIS — W19XXXA Unspecified fall, initial encounter: Secondary | ICD-10-CM

## 2014-03-07 LAB — GLUCOSE, CAPILLARY
GLUCOSE-CAPILLARY: 158 mg/dL — AB (ref 70–99)
Glucose-Capillary: 213 mg/dL — ABNORMAL HIGH (ref 70–99)
Glucose-Capillary: 144 mg/dL — ABNORMAL HIGH (ref 70–99)
Glucose-Capillary: 125 mg/dL — ABNORMAL HIGH (ref 70–99)

## 2014-03-07 NOTE — Progress Notes (Signed)
Occupational Therapy Session Note  Patient Details  Name: Yolanda Solis MRN: [id number] Date of Birth: [birth year]  Today's Date: 03/07/2014 OT Individual Time: 4818-5631 OT Individual Time Calculation (min): 45 min    Short Term Goals: Week 2:  OT Short Term Goal 1 (Week 2): Pt will complete bed rolls with mod assist to manage legs during assisted BADL OT Short Term Goal 2 (Week 2): Pt will incorporate use of appropriate AE to improve participation in lower body BADL OT Short Term Goal 3 (Week 2): Pt will demonstrate ability to direct caregivers to assist in pressure relief while maintaining back precautions 100% of the time OT Short Term Goal 4 (Week 2): Pt will retreive beverage or food item from refrigerator at w/c level using AE, prn OT Short Term Goal 5 (Week 2): Pt will demo ability to perform HEP for 30 minutes to mprove endurance and UE strengthening with supervision  Skilled Therapeutic Interventions/Progress Updates: ADL-retraining with emphasis on upper body bathing/dressing skills and improved bed rolls.    With only setup/min assist to remove shirt (pt=95%) pt completed upper body bathing and progressed to grooming while supine in bed.   Pt is washed peri-area with setup to manage LE allowing improved access.   Pt is able to consistently maintain "no twisting" precaution during bed rolls now without need for cues except when distracted by conversations.    Pt required total assist for lower body bathing and dressing and use of mech lift to exit bed.   Pt transferred to w/c with lift and completed pressure relief with max assist X1 to lift buttocks from w/c seat.    Pt left in w/c with safety belt attached and call light within reach.    Therapy Documentation Precautions:  Precautions Precautions: Fall;Back Required Braces or Orthoses: Spinal Brace Spinal Brace: Thoracolumbosacral orthotic;Applied in supine position Restrictions Weight Bearing Restrictions: No  Pain: Pain  Assessment Pain Assessment: No/denies pain Pain Score: 2  Pain Type: Surgical pain Pain Location: Back Pain Orientation: Mid Pain Descriptors / Indicators: Aching Pain Frequency: Intermittent Pain Onset: On-going Patients Stated Pain Goal: 4 Pain Intervention(s): Medication (See eMAR)  ADL: ADL ADL Comments: see FIM  See FIM for current functional status  Therapy/Group: Individual Therapy  Second session: Time: 1330-1400 Time Calculation (min):  30 min  General: OT Amount of Missed Time: 30 Minutes  Pain Assessment: 5/10, lower back  Skilled Therapeutic Interventions: ADL-retraining with focus on toilet hygiene, bed mobility, lower body dressing, discharge planning.   Pt received supine in bed reporting recurrent nausea with emesis.   Pt declined out-of-bed therapy session but was noted to have soiled diaper and agreed to continue ADL-retraining to address toileting needs while articulating discharge plans.   Pt benefited from +2 assist for clothing management although performance of bed mobility has improved to manageable for 1 caregiver.   Pt is aware of use and need for DME (lift, hospital bed, reclining w/c) and acknowledges SNF placement as a potential target environment d/t family as caregivers not available 24/7.  Pt was soiled with loose BM and was unable to perform any component of hygiene.   Pt received total assist care while supine in bed; RN notified of small wound at buttocks.   Pt unable to participate in further treatment d/t nausea after assist with toilet hygiene.   Call light and phone placed within reach.    See FIM for current functional status  Therapy/Group: Individual Therapy  [place] 03/07/2014, 2:12 PM

## 2014-03-07 NOTE — Progress Notes (Signed)
[place] PHYSICAL MEDICINE & REHABILITATION     PROGRESS NOTE    Subjective/Complaints: Pain "mostly" controlled. Stools still inconsistent. A  review of systems has been performed and if not noted above is otherwise negative.   Objective: Vital Signs: Blood pressure 127/60, pulse 72, temperature 98.2 F (36.8 C), temperature source Oral, resp. rate 16, weight 93.214 kg (205 lb 8 oz), SpO2 96.00%. No results found. No results found for this basename: WBC, HGB, HCT, PLT,  in the last 72 hours No results found for this basename: NA, K, CL, CO, GLUCOSE, BUN, CREATININE, CALCIUM,  in the last 72 hours CBG (last 3)   Recent Labs  03/06/14 1656 03/06/14 2122 03/07/14 0715  GLUCAP 164* 142* 158*    Wt Readings from Last 3 Encounters:  03/02/14 93.214 kg (205 lb 8 oz)  02/17/14 94.7 kg (208 lb 12.4 oz)  02/17/14 94.7 kg (208 lb 12.4 oz)    Physical Exam:  Constitutional: She is oriented to person, place, and time. She appears well-developed.  HENT: oral mucosa pink/moist  Head: Normocephalic.  Eyes: EOM are normal.  Neck: Normal range of motion. Neck supple. No thyromegaly present.  Cardiovascular: Normal rate and regular rhythm. No murmurs  Respiratory: Effort normal and breath sounds normal. No respiratory distress. No wheezes  GI: Soft. Bowel sounds are normal. She exhibits less distension with improved bowel sounds. Abdomen still large. Back incision clean.   Neuro: alert and oriented. Cognitively appears appropriate  T12 sensory level---0/2 sensation below the level 0/5 strength LLE. Now with trace movement Right HF, KE, 0 at right ankle. No resting tone.  UE motor 5/5, UE sensory normal. CN intact. Cognition appropriate  Psych: normal   Assessment/Plan: 1. Functional deficits secondary to T10 fx/dislocation with complete SCI which require 3+ hours per day of interdisciplinary therapy in a comprehensive inpatient rehab setting. Physiatrist is providing close team  supervision and 24 hour management of active medical problems listed below. Physiatrist and rehab team continue to assess barriers to discharge/monitor patient progress toward functional and medical goals.  Wearing prafo's at night.  FIM: FIM - Bathing Bathing Steps Patient Completed: Chest;Right Arm;Left Arm;Abdomen Bathing: 2: Max-Patient completes 3-4 16f 10 parts or 25-49%  FIM - Upper Body Dressing/Undressing Upper body dressing/undressing steps patient completed: Thread/unthread right sleeve of pullover shirt/dresss;Thread/unthread left sleeve of pullover shirt/dress;Put head through opening of pull over shirt/dress Upper body dressing/undressing: 4: Min-Patient completed 75 plus % of tasks FIM - Lower Body Dressing/Undressing Lower body dressing/undressing: 1: Total-Patient completed less than 25% of tasks  FIM - Toileting Toileting: 1: Two helpers  FIM - Air cabin crew Transfers: 0-Activity did not occur  FIM - Control and instrumentation engineer Devices:  (used lift) Bed/Chair Transfer: 1: Mechanical lift;1: Two helpers  FIM - Locomotion: Wheelchair Distance: 150 Locomotion: Wheelchair: 5: Travels 150 ft or more: maneuvers on rugs and over door sills with supervision, cueing or coaxing FIM - Locomotion: Ambulation Locomotion: Ambulation: 0: Activity did not occur  Comprehension Comprehension Mode: Auditory Comprehension: 5-Understands basic 90% of the time/requires cueing < 10% of the time  Expression Expression Mode: Verbal Expression: 5-Expresses basic 90% of the time/requires cueing < 10% of the time.  Social Interaction Social Interaction: 5-Interacts appropriately 90% of the time - Needs monitoring or encouragement for participation or interaction.  Problem Solving Problem Solving: 5-Solves basic 90% of the time/requires cueing < 10% of the time  Memory Memory: 5-Recognizes or recalls 90% of the time/requires cueing < 10%  of the  time Medical Problem List and Plan:  1. Functional deficits secondary to T10 fracture with complete paraplegia after a fall. Status post T8-T12 fusion laminectomy decompression  2. DVT Prophylaxis/Anticoagulation:   lovenox 40q12. Dopplers neg 3. Pain Management: Hydrocodone and Robaxin as needed.  -fentanyl patch seems to have helped also 4. Mood/anxiety: Xanax as needed. Provide emotional support   - neuropsych to see this pt as well 5. Neuropsych: This patient is capable of making decisions on her own behalf.  6. Skin/Wound Care: Routine skin checks of surgical site and pressure areas  7. Hypertension/atrial fibrillation. Presently on Cardizem 60 mg 4 times a day. Cardiac rate control. A chest pain or shortness of breath.  8. Diabetes mellitus with peripheral neuropathy. Hemoglobin A1c 7.9. Presently with sliding scale insulin. Check blood sugars a.c. and at bedtime/  -sugars showing improvement  -increased glucophage to 500mg  bid.    -increase scheduled lantus to 15u qhs with improved control   -continue CM diet  9. Neurogenic bowel and bladder: timed bowel program, foley for now---consider voiding trial but given severity of injury, her prognosis for return is poor.  10. Ileus: resolved  -still struggling with bowel program           LOS (Days) 12 A FACE TO FACE EVALUATION WAS PERFORMED  Yolanda Solis T 03/07/2014 7:59 AM

## 2014-03-07 NOTE — Progress Notes (Signed)
Patient has had a few episodes of vomiting today.  She has also had diarrhea.  Nausea has been occuring for the last 4-5 days now, but is getting increasingly worse with patient unable to keep hardly any po intake down.  Made Marlowe Shores, PA aware, new orders for abdominal xray received.  Will continue to monitor. Brita Romp, RN

## 2014-03-07 NOTE — Progress Notes (Signed)
Physical Therapy Session Note  Patient Details  Name: Yolanda Solis MRN: [id number] Date of Birth: 08-25-1933  Today's Date: 03/07/2014 PT Individual Time: 1002-1105 PT Individual Time Calculation (min): 63 min  Session 2 Time: 1515-1530 Time Calculation (min): 15 min Missed Time: 60 min   Short Term Goals: Week 2:  PT Short Term Goal 1 (Week 2): Pt will propel manual w/c x 150' req SBA and demonstrate ability to lock/unlock brakes.  PT Short Term Goal 2 (Week 2): Pt will be able to verbally instruct set up for sliding board transfer.  PT Short Term Goal 3 (Week 2): Pt will transfer w/c to/from mat req 1 person assist.  PT Short Term Goal 4 (Week 2): Pt will demonstrate ability to follow commands for trunk leaning during head-hips relationship PT Short Term Goal 5 (Week 2): Pt will demonstrate ability to sit body up in w/c by using arms on armrests to pull body forward   Skilled Therapeutic Interventions/Progress Updates:    Session 1: Pt received seated in w/c, agreeable to participate in therapy. PT Barbaraann Rondo present for duration of session for safety. Pt propelled w/c 100' to rehab gym w/ S. Transferred w/c>mat table w/ sliding board w/ +2 TotalA. Pt able to sit edge of mat w/ assist ranging from SBA to [place]. Pt benefited from aerobic step underneath feet to increase feedback and improve seated balance. Pt stood w/ standing frame 4x for 1-2 minutes each stand. Pt noted to be bearing more weight through LLE, difficult to shift weight to midline. Attempted glute/quad sets in standing, pt demonstrated trace glute contractions in standing, unable to detect quad contractions. Pt continues to be limited by anxiety and fear with movement, though note that pt able to tolerate standing frame for more time than previously. Pt would benefit from further work to increase trunk control, though this is limited by TLSO and back precautions. Pt transferred mat>w/c w/ sliding board and +2 Total A w/ aerobic  step elevating feet. Pt transported back to room via w/c, upon return to room pt began dry heaving w/ small amount of clear saliva coming up, complaining of high nausea level. Pt transferred back to bed w/ MaxiSky lift, TotalA to place sling. RN made aware of nausea, pt left supine in bed w/ HOB elevated and all needs within reach.  Session 2: Pt received supine in bed. Pt reports she has been having nausea/diarrhea all afternoon and that she was scheduled to have repeat stomach x-ray this PM. Pt refused OOB therapy or bed level LE exercises. Pt demonstrated UE exercises elbow flexion w/ orange theraband and using leg lifter to lift each foot x10. Pt requested to continue therapy tomorrow. Discussed discharge disposition w/ pt. Pt is more agreeable to possibility of SNF stay than previous discussions. Pt left supine in bed w/ all needs within reach.   Therapy Documentation Precautions:  Precautions Precautions: Fall;Back Required Braces or Orthoses: Spinal Brace Spinal Brace: Thoracolumbosacral orthotic;Applied in supine position Restrictions Weight Bearing Restrictions: No General:   Vital Signs: Therapy Vitals Temp: 98.2 F (36.8 C) Temp src: Oral Pulse Rate: 72 Resp: 16 BP: 127/60 mmHg Patient Position (if appropriate): Lying Oxygen Therapy SpO2: 96 % Pain:   Mobility:   Locomotion :    Trunk/Postural Assessment :    Balance:   Exercises:   Other Treatments:    See FIM for current functional status  Therapy/Group: Individual Therapy  [name] [name], PT, DPT 03/07/2014, 7:43 AM

## 2014-03-08 ENCOUNTER — Inpatient Hospital Stay (HOSPITAL_COMMUNITY): Payer: Medicare Other

## 2014-03-08 DIAGNOSIS — E1165 Type 2 diabetes mellitus with hyperglycemia: Secondary | ICD-10-CM

## 2014-03-08 DIAGNOSIS — Z5189 Encounter for other specified aftercare: Secondary | ICD-10-CM

## 2014-03-08 DIAGNOSIS — IMO0001 Reserved for inherently not codable concepts without codable children: Secondary | ICD-10-CM

## 2014-03-08 DIAGNOSIS — W19XXXA Unspecified fall, initial encounter: Secondary | ICD-10-CM

## 2014-03-08 DIAGNOSIS — IMO0002 Reserved for concepts with insufficient information to code with codable children: Secondary | ICD-10-CM

## 2014-03-08 LAB — GLUCOSE, CAPILLARY
GLUCOSE-CAPILLARY: 187 mg/dL — AB (ref 70–99)
GLUCOSE-CAPILLARY: 161 mg/dL — AB (ref 70–99)
GLUCOSE-CAPILLARY: 138 mg/dL — AB (ref 70–99)
Glucose-Capillary: 191 mg/dL — ABNORMAL HIGH (ref 70–99)

## 2014-03-08 MED ORDER — INSULIN GLARGINE 100 UNIT/ML ~~LOC~~ SOLN
18.0000 [IU] | Freq: Every day | SUBCUTANEOUS | Status: DC
  Administered 2014-03-08 – 2014-03-13 (×6): 18 [IU] via SUBCUTANEOUS
  Filled 2014-03-08 (×7): qty 0.18

## 2014-03-08 NOTE — Progress Notes (Addendum)
[place] PHYSICAL MEDICINE & REHABILITATION     PROGRESS NOTE    Subjective/Complaints: Had some nausea yesterday but says it was associated with her "inner ear" problems. A  review of systems has been performed and if not noted above is otherwise negative.   Objective: Vital Signs: Blood pressure 120/56, pulse 92, temperature 98.4 F (36.9 C), temperature source Oral, resp. rate 17, weight 93.214 kg (205 lb 8 oz), SpO2 96.00%. Dg Abd 1 View  03/07/2014   CLINICAL DATA:  Nausea and vomiting.  EXAM: ABDOMEN - 1 VIEW  COMPARISON:  02/26/2014.  FINDINGS: Normal bowel gas pattern. Thoracolumbar spine fixation hardware. Extensive lumbar spine degenerative changes. Atheromatous arterial calcifications.  IMPRESSION: No acute abnormality.   Electronically Signed   By: Enrique Sack M.D.   On: 03/07/2014 17:42   No results found for this basename: WBC, HGB, HCT, PLT,  in the last 72 hours No results found for this basename: NA, K, CL, CO, GLUCOSE, BUN, CREATININE, CALCIUM,  in the last 72 hours CBG (last 3)   Recent Labs  03/07/14 1713 03/07/14 2108 03/08/14 0629  GLUCAP 144* 125* 187*    Wt Readings from Last 3 Encounters:  03/02/14 93.214 kg (205 lb 8 oz)  02/17/14 94.7 kg (208 lb 12.4 oz)  02/17/14 94.7 kg (208 lb 12.4 oz)    Physical Exam:  Constitutional: She is oriented to person, place, and time. She appears well-developed.  HENT: oral mucosa pink/moist  Head: Normocephalic.  Eyes: EOM are normal.  Neck: Normal range of motion. Neck supple. No thyromegaly present.  Cardiovascular: Normal rate and regular rhythm. No murmurs  Respiratory: Effort normal and breath sounds normal. No respiratory distress. No wheezes  GI: Soft. Bowel sounds are normal. She exhibits less distension with improved bowel sounds. Abdomen still large. Back incision clean.   Neuro: alert and oriented. Cognitively appears appropriate  T12 sensory level---0/2 sensation below the level 0/5 strength LLE.    Trace+ movement Right HF, KE, tr at right ankle. No resting tone.  UE motor 5/5, UE sensory normal. CN intact. Cognition appropriate  Psych: normal   Assessment/Plan: 1. Functional deficits secondary to T10 fx/dislocation with complete SCI which require 3+ hours per day of interdisciplinary therapy in a comprehensive inpatient rehab setting. Physiatrist is providing close team supervision and 24 hour management of active medical problems listed below. Physiatrist and rehab team continue to assess barriers to discharge/monitor patient progress toward functional and medical goals.  Consider vestibular assessment if nausea/vertigo recur.  FIM: FIM - Bathing Bathing Steps Patient Completed: Chest;Right Arm;Left Arm;Abdomen Bathing: 2: Max-Patient completes 3-4 52f 10 parts or 25-49%  FIM - Upper Body Dressing/Undressing Upper body dressing/undressing steps patient completed: Thread/unthread right sleeve of pullover shirt/dresss;Thread/unthread left sleeve of pullover shirt/dress;Put head through opening of pull over shirt/dress Upper body dressing/undressing: 4: Min-Patient completed 75 plus % of tasks FIM - Lower Body Dressing/Undressing Lower body dressing/undressing: 1: Total-Patient completed less than 25% of tasks  FIM - Toileting Toileting: 1: Two helpers  FIM - Air cabin crew Transfers: 0-Activity did not occur  FIM - Control and instrumentation engineer Devices:  Architectural technologist) Bed/Chair Transfer: 1: Mechanical lift  FIM - Locomotion: Wheelchair Distance: 150 Locomotion: Wheelchair: 5: Travels 150 ft or more: maneuvers on rugs and over door sills with supervision, cueing or coaxing FIM - Locomotion: Ambulation Locomotion: Ambulation: 0: Activity did not occur  Comprehension Comprehension Mode: Auditory Comprehension: 5-Understands basic 90% of the time/requires cueing < 10% of the  time  Expression Expression Mode: Verbal Expression: 5-Expresses basic  90% of the time/requires cueing < 10% of the time.  Social Interaction Social Interaction: 5-Interacts appropriately 90% of the time - Needs monitoring or encouragement for participation or interaction.  Problem Solving Problem Solving: 5-Solves basic 90% of the time/requires cueing < 10% of the time  Memory Memory: 5-Recognizes or recalls 90% of the time/requires cueing < 10% of the time Medical Problem List and Plan:  1. Functional deficits secondary to T10 fracture with complete paraplegia after a fall. Status post T8-T12 fusion laminectomy decompression  2. DVT Prophylaxis/Anticoagulation:   lovenox 40q12. Dopplers neg 3. Pain Management: Hydrocodone and Robaxin as needed.  -fentanyl patch seems to have helped also 4. Mood/anxiety: Xanax as needed. Provide emotional support   - neuropsych to see this pt as well 5. Neuropsych: This patient is capable of making decisions on her own behalf.  6. Skin/Wound Care: Routine skin checks of surgical site and pressure areas  7. Hypertension/atrial fibrillation. Presently on Cardizem 60 mg 4 times a day. Cardiac rate control. A chest pain or shortness of breath.  8. Diabetes mellitus with peripheral neuropathy. Hemoglobin A1c 7.9. Presently with sliding scale insulin. Check blood sugars a.c. and at bedtime/  -sugars showing improvement  -increased glucophage to 500mg  bid.    -increase scheduled lantus to 18u qhs    -continue CM diet  9. Neurogenic bladder:  foley for now---consider voiding trial but given severity of injury, her prognosis for return is poor.  10. Neuro bowel:   -still struggling with bowel program (loose bm last night)  -am supp           LOS (Days) 13 A FACE TO FACE EVALUATION WAS PERFORMED  Yolanda Solis 03/08/2014 8:12 AM

## 2014-03-08 NOTE — Progress Notes (Signed)
Occupational Therapy Session Note  Patient Details  Name: Yolanda Solis MRN: [id number] Date of Birth: 05-04-1934  Today's Date: 03/08/2014 OT Individual Time: 5498-2641 OT Individual Time Calculation (min): 60 min    Short Term Goals: Week 2:  OT Short Term Goal 1 (Week 2): Pt will complete bed rolls with mod assist to manage legs during assisted BADL OT Short Term Goal 2 (Week 2): Pt will incorporate use of appropriate AE to improve participation in lower body BADL OT Short Term Goal 3 (Week 2): Pt will demonstrate ability to direct caregivers to assist in pressure relief while maintaining back precautions 100% of the time OT Short Term Goal 4 (Week 2): Pt will retreive beverage or food item from refrigerator at w/c level using AE, prn OT Short Term Goal 5 (Week 2): Pt will demo ability to perform HEP for 30 minutes to mprove endurance and UE strengthening with supervision  Skilled Therapeutic Interventions/Progress Updates: ADL-retraining with focus on improved LB bathing using AE (LH sponge), improved ability to direct care (during assisted ADL), bed mobility, and adherence to back precautions during bed mobility.   Pt completed assisted supine bathing with good thoroughness after setup.  Pt was able to bathe upper body with setup assist and lower body with mod assist this session using AE (LH sponge).    Pt is able to maintain 2 of 3 back precautions but continues to require intermittent cues to avoid trunk rotation during bed mobility.   Pt is realistic about her progress to date with new plan to consider SNF placement versus direct discharge to her son's residence due to extensive need for DME (mech lift, air mattress, hospital bed, ramp).     Pt left supine in bed at end of session due to no time remaining for mech lift transfer to w/c.     Therapy Documentation Precautions:  Precautions Precautions: Fall;Back Required Braces or Orthoses: Spinal Brace Spinal Brace:  Thoracolumbosacral orthotic;Applied in supine position Restrictions Weight Bearing Restrictions: No  Pain: No/denies pain   ADL: ADL ADL Comments: see FIM  See FIM for current functional status  Therapy/Group: Individual Therapy  Second session: Time: 5830-9407 Time Calculation (min): 45 min  Pain Assessment: No/denies pain  Skilled Therapeutic Interventions: Therapeutic activity (30 min) with focus on w/c mobility, beverage retrieval at w/c level, and discharge planning.    Therapeutic exercise (15 min) with focus on improved general endurance and BUE strengthening using Sci-Fit arm ergometry.  Pt completed 10 min of UE ergometry, level 1, random routine.   Pt states she has reacher at her home but required extra time and verbal cues for technique to maneuver her w/c in ADL apartment to retrieve 1 beverage from refrigerator.     Pt requires min cues for problem-solving during w/c mobility.   See FIM for current functional status  Therapy/Group: Individual Therapy  [place] 03/08/2014, 1:51 PM

## 2014-03-08 NOTE — Progress Notes (Signed)
Physical Therapy Session Note  Patient Details  Name: Yolanda Solis MRN: [id number] Date of Birth: [birth year]  Today's Date: 03/08/2014 PT Individual Time: 1100-1115 PT Individual Time Calculation (min): 15 min by [name], PT, DPT   PT Co-Treatment Time: 1000-1100 (Co-tx with shared note)  PT Co-Treatment Time Calculation (min): 60 min  Short Term Goals: Week 2:  PT Short Term Goal 1 (Week 2): Pt will propel manual w/c x 150' req SBA and demonstrate ability to lock/unlock brakes.  PT Short Term Goal 2 (Week 2): Pt will be able to verbally instruct set up for sliding board transfer.  PT Short Term Goal 3 (Week 2): Pt will transfer w/c to/from mat req 1 person assist.  PT Short Term Goal 4 (Week 2): Pt will demonstrate ability to follow commands for trunk leaning during head-hips relationship PT Short Term Goal 5 (Week 2): Pt will demonstrate ability to sit body up in w/c by using arms on armrests to pull body forward   Skilled Therapeutic Interventions/Progress Updates:    Session 1: Pt received supine in bed, agreeable to participate in therapy. Pt initially declined to attempt sliding board transfer bed>w/c due to fear, but agreed w/ min encouragement. Pt moved supine>sit w/ MaxA to manage BLE off of bed and move sidelying>sit due to decreased trunk control. Pt transferred bed>w/c w/ sliding board, aerobic step under feet, and TotalA +2. Pt benefited from use of HOB functions in order to setup downhill transfer. Pt propelled w/c 100' to rehab gym w/ S, use of BUE. Pt performed 2x1' then 3'x2 on Kinetron for BLE NMR. First set w/ alternating LLE/RLE extension w/ Mod-MaxA from therapists assisting w/ step, second set w/ one LE at a time pressing against resistance, third set back to alternating. Noted trace contractions in bilateral glutes during extension exercise, no palpable contraction in hamstrings. Pt then stood w/ standing frame for 20' for BLE sensory feedback. No complaints of pain,  dizziness, lightheadedness. No aerobic step under feet, yoga block between feet to maintain good alignment. Pt completed reaching outside BOS anteriorly to play tic tac toe game on mirror. Pt propelled w/c 100' back to room, left seated in w/c w/ all needs within reach.   Therapy Documentation Precautions:  Precautions Precautions: Fall;Back Required Braces or Orthoses: Spinal Brace Spinal Brace: Thoracolumbosacral orthotic;Applied in supine position Restrictions Weight Bearing Restrictions: No Pain: Pain Assessment Pain Assessment: No/denies pain (premedicated prior to therapy) Pain Score: 0-No pain  See FIM for current functional status  Therapy/Group: Individual Therapy and Co-Treatment  [name], PT, DPT Gilmore [place], PT, DPT  03/08/2014, 8:40 AM

## 2014-03-08 NOTE — Progress Notes (Addendum)
Physical Therapy Vestibular Assessment and Plan  Patient Details  Name: JAMILA SLATTEN MRN: [id number] Date of Birth: 1934-06-05  PT Diagnosis: Dizziness and giddiness  Today's Date: 03/08/2014 PT Co-Treatment Time: 1530-1600 (Co-tx with JG; entire session from 1515-1600)  PT Co-Treatment Time Calculation (min): 30 min   Problem List:  Patient Active Problem List   Diagnosis Date Noted  . SCI (spinal cord injury) 02/23/2014  . Fracture dislocation of thoracic spine 02/18/2014  . HTN (hypertension) 02/18/2014  . Type II or unspecified type diabetes mellitus without mention of complication, uncontrolled 02/18/2014  . Fracture of thoracic spine 02/17/2014    Past Medical History:  Past Medical History  Diagnosis Date  . Diabetes mellitus   . Hypertension   . Arthritis    Past Surgical History:  Past Surgical History  Procedure Laterality Date  . Abdominal hysterectomy    . Posterior lumbar fusion 4 level N/A 02/17/2014    Procedure: Thoracolumbar Repair;  Surgeon: Erline Levine, MD;  Location: [facility];  Service: Neurosurgery;  Laterality: N/A;  Thoracolumbar Repair T8 - T12   Subjective: Pt reports "wooziness", nausea, and vomiting following head movement during therapy sessions. Constant tinnitus and impaired hearing in bilat ears has been present for "years", per pt. Pt sustained fall on 02/18/14 but denies LOC, head trauma. Pt/son both report multiple falls prior to admission, which pt attributes to lightheadedness and syncopal episodes. Per son, pt has had a "lazy" L eye for as long as he can remember. Pt also reports intermittent visual disturbances, such as "floaters" and a "shadow". Pt denies diplopia. Reports h/o surgical removal of bilat cataracts. With active cervical spine rotation, pt denies signs/symptoms of VBI/CAD.   Evaluation: Vertebral Artery Test (-)  Eye Alignment Mild ptosis noted in L eye with increased fatigue  Spontaneous  Nystagmus Not observed   Gaze holding nystagmus Abnormal superior tracking with L monocular vision (nystagmus vs. catchup saccades in attempt to relocate target)  Smooth pursuit Abnormal. - Catchup saccades in L eye with horizontal and superior tracking.  Oculomotor - Ocular ROM grossly WNL in R eye.  - L eye with decreased ability to hold gaze in L superior quadrant.  Saccades Horizontal saccades grossly WNL; noted slow vertical saccades in L eye (more prominent with superior than inferior tracking).  VOR slow Grossly WNL with horizontal saccades; unable to formally assess vertical VOR secondary to pt tendency to close eyes.  Head Thrust Test (+) R  (-) L - limited by decreased sustained attention, tendency to close eyes, and cervical spine muscle guarding  Head Shaking Nystagmus Not formally assessed  Modified Rt. Hallpike Dix Modified by placing bed in 30 degrees Trendelenburg secondary to pt discomfort with cervical spine extension.    (-) for nystagmus/ vertiginous symptoms.  Pt did report symptoms of disequilibrium after 10-15 seconds in testing position.  Modified Lt. [employer] (-) See above.  Rt. Roll Test (-) and asymptomatic  Lt. Roll Test  (-) and symptomatic   Visual- Vestibular Interactions:   Pt reports concordant "wooziness" with seated horizontal head turns. Pt demonstrates signs of disequilibrium (increased blinking, holding eyes closed, increase in posterior preference) with seated head pitches.  Findings: Exam findings suggestive of motion sensitivity with horizontal/vertical head turns. Unable to rule out R unilateral vestibular hypofunction, as R Head Thrust Test was consistently positive. Positional vertigo ruled out by negative (Modified) Dix-Hallpike and Roll Tests. Evaluation limited by decreased sustained attention, guarding of cervical spine musculature, and pt fear/avoidance  of movement.  Plan:  1. Adaptation: VOR (x1 viewing with horizontal and vertical head turnings) starting in  semi reclined then progressing to seated and with w/c mobility. Expose pt to functional head movement to facilitate nervous system adaptation to possible loss of vestibular input on R. Utilize non-verbal signs of disequilibrium (increased blinking, holding eyes closed, changes in postural stability), as subjective dizziness rating may be inaccurate. 2. Habituation: expose pt to noxious stimuli (horizontal/vertical head movements) to decrease symptoms over time.  PT Evaluation Precautions/Restrictions   General   Vital SignsTherapy Vitals Temp: 98 F (36.7 C) Temp src: Oral Pulse Rate: 79 Resp: 18 BP: 144/66 mmHg Patient Position (if appropriate): Sitting Oxygen Therapy SpO2: 98 % O2 Device: None (Room air) Pain Pain Assessment Pain Assessment: 0-10 Pain Score: 2  Pain Type: Surgical pain Pain Location: Back Pain Descriptors / Indicators: Aching Pain Frequency: Intermittent Pain Onset: On-going Patients Stated Pain Goal: 4 Pain Intervention(s): Medication (See eMAR)  Stefano Gaul 03/08/2014, 4:14 PM

## 2014-03-08 NOTE — Progress Notes (Signed)
Physical Therapy Session Note  Patient Details  Name: COLUMBIA PANDEY MRN: [id number] Date of Birth: 14-Sep-1933  Today's Date: 03/08/2014 PT Individual Time: 7017-7939 PT Individual Time Calculation (min): 15 min  and Today's Date: 03/08/2014 PT Co-Treatment Time: 1515-1530 (co-tx w/ PT Benjie Karvonen 1515-1600) PT Co-Treatment Time Calculation (min): 15 min  Short Term Goals: Week 2:  PT Short Term Goal 1 (Week 2): Pt will propel manual w/c x 150' req SBA and demonstrate ability to lock/unlock brakes.  PT Short Term Goal 2 (Week 2): Pt will be able to verbally instruct set up for sliding board transfer.  PT Short Term Goal 3 (Week 2): Pt will transfer w/c to/from mat req 1 person assist.  PT Short Term Goal 4 (Week 2): Pt will demonstrate ability to follow commands for trunk leaning during head-hips relationship PT Short Term Goal 5 (Week 2): Pt will demonstrate ability to sit body up in w/c by using arms on armrests to pull body forward   Skilled Therapeutic Interventions/Progress Updates:    Pt received seated in w/c, agreeable to participate in therapy. Pt transferred w/c>bed w/ MaxiSky lift due to reported pain in back from sitting in w/c. Pt able to lean R/L while seated in chair w/ MinA in order to place sling. Pt transferred to bed, discussed discharge disposition with son Jori Moll who was present. Educated son on requirements for pt to discharge home (extensive equipment and family training needs), so team is discussing SNF options. Pt agreeable to discuss possibility of SNF. PT Benjie Karvonen co-treated [id number] for vestibular evaluation, see vestibular note for details. Pt left supine in bed w/ son present and all needs within reach.  Therapy Documentation Precautions:  Precautions Precautions: Fall;Back Required Braces or Orthoses: Spinal Brace Spinal Brace: Thoracolumbosacral orthotic;Applied in supine position Restrictions Weight Bearing Restrictions: No General:   Vital Signs: Therapy  Vitals Temp: 98 F (36.7 C) Temp src: Oral Pulse Rate: 79 Resp: 18 BP: 144/66 mmHg Patient Position (if appropriate): Sitting Oxygen Therapy SpO2: 98 % O2 Device: None (Room air) Pain: Pain Assessment Pain Assessment: 0-10 Pain Score: 2  Pain Type: Surgical pain Pain Location: Back Pain Descriptors / Indicators: Aching Pain Frequency: Intermittent Pain Onset: On-going Patients Stated Pain Goal: 4 Pain Intervention(s): Medication (See eMAR) Mobility:   Locomotion :    Trunk/Postural Assessment :    Balance:   Exercises:   Other Treatments:    See FIM for current functional status  Therapy/Group: Individual Therapy  [name] [name], PT, DPT  03/08/2014, 5:22 PM

## 2014-03-09 ENCOUNTER — Inpatient Hospital Stay (HOSPITAL_COMMUNITY): Payer: Medicare Other | Admitting: Physical Therapy

## 2014-03-09 ENCOUNTER — Inpatient Hospital Stay (HOSPITAL_COMMUNITY): Payer: Medicare Other | Admitting: Occupational Therapy

## 2014-03-09 ENCOUNTER — Ambulatory Visit (HOSPITAL_COMMUNITY): Payer: Medicare Other | Admitting: Occupational Therapy

## 2014-03-09 ENCOUNTER — Inpatient Hospital Stay (HOSPITAL_COMMUNITY): Payer: Medicare Other

## 2014-03-09 DIAGNOSIS — W19XXXA Unspecified fall, initial encounter: Secondary | ICD-10-CM

## 2014-03-09 DIAGNOSIS — Z5189 Encounter for other specified aftercare: Secondary | ICD-10-CM

## 2014-03-09 DIAGNOSIS — E1165 Type 2 diabetes mellitus with hyperglycemia: Secondary | ICD-10-CM

## 2014-03-09 DIAGNOSIS — IMO0002 Reserved for concepts with insufficient information to code with codable children: Secondary | ICD-10-CM

## 2014-03-09 DIAGNOSIS — IMO0001 Reserved for inherently not codable concepts without codable children: Secondary | ICD-10-CM

## 2014-03-09 LAB — GLUCOSE, CAPILLARY
GLUCOSE-CAPILLARY: 173 mg/dL — AB (ref 70–99)
Glucose-Capillary: 165 mg/dL — ABNORMAL HIGH (ref 70–99)
Glucose-Capillary: 177 mg/dL — ABNORMAL HIGH (ref 70–99)
Glucose-Capillary: 158 mg/dL — ABNORMAL HIGH (ref 70–99)

## 2014-03-09 NOTE — Plan of Care (Signed)
Problem: RH SKIN INTEGRITY Goal: RH STG SKIN FREE OF INFECTION/BREAKDOWN Max A free of skin breakdown  Outcome: Not Progressing Stage II coccyx Goal: RH STG MAINTAIN SKIN INTEGRITY WITH ASSISTANCE STG Maintain Skin Integrity With Mod Assistance.  Outcome: Not Progressing Stage II coccyx

## 2014-03-09 NOTE — Progress Notes (Signed)
Occupational Therapy Session Note  Patient Details  Name: Yolanda Solis MRN: [id number] Date of Birth: Dec 27, 1933  Today's Date: 03/09/2014 OT Co-Treatment Time: 1430-1500 OT Co-Treatment Time Calculation (min): 30 min Pt seen with PT [name] Hyslop) from 1430-1530   Short Term Goals: Week 2:  OT Short Term Goal 1 (Week 2): Pt will complete bed rolls with mod assist to manage legs during assisted BADL OT Short Term Goal 2 (Week 2): Pt will incorporate use of appropriate AE to improve participation in lower body BADL OT Short Term Goal 3 (Week 2): Pt will demonstrate ability to direct caregivers to assist in pressure relief while maintaining back precautions 100% of the time OT Short Term Goal 4 (Week 2): Pt will retreive beverage or food item from refrigerator at w/c level using AE, prn OT Short Term Goal 5 (Week 2): Pt will demo ability to perform HEP for 30 minutes to mprove endurance and UE strengthening with supervision  Skilled Therapeutic Interventions/Progress Updates:  Upon entering the room, pt supine in bed with spinal brace off and resting. Pt with 4/10 pain upon entering the room. Pt reporting, "I think I need to be changed." Pt engaged in numerous bouts of 10+ times rolling to the L and R with Mod A for feet and hips for clothing management and hygiene. Pt pushing elastic waist pants from bilateral hips with verbal cues and assist to get down legs for hygiene. Pt did pull diaper sides apart with verbal cues for participation. Pt required total A for hygiene after pt was incontinent of bowel. OT and PT donned spinal brace for patient while in supine position with total A.  Please refer to PT note for supine to sit, sliding board transfer, and other related activities this session.    Therapy Documentation Precautions:  Precautions Precautions: Fall;Back Required Braces or Orthoses: Spinal Brace Spinal Brace: Thoracolumbosacral orthotic;Applied in supine  position Restrictions Weight Bearing Restrictions: No Pain: Pain Assessment Pain Assessment: 0-10 Pain Score: 1  Pain Type: Surgical pain Pain Location: Back Pain Orientation: Lower Pain Descriptors / Indicators: Aching Pain Frequency: Intermittent Pain Onset: On-going Patients Stated Pain Goal: 4 Pain Intervention(s): Rest;Repositioned Multiple Pain Sites: No ADL: ADL ADL Comments: see FIM  See FIM for current functional status  Therapy/Group: Co-Treatment  Pittman, Carylon Tamburro L 03/09/2014, 5:14 PM

## 2014-03-09 NOTE — Progress Notes (Signed)
NUTRITION FOLLOW-UP  DOCUMENTATION CODES Per approved criteria  -Obesity Unspecified   INTERVENTION: Encourage PO intake.  NUTRITION DIAGNOSIS: Increased nutrient needs related to spinal cord injury as evidenced by estimated nutrition needs; ongoing  Goal: Pt to meet >/= 90% of their estimated nutrition needs, met  Monitor:  PO intake, weight trends, labs, I/O's  80 y.o. female  Admitting Dx: Spinal cord injury  ASSESSMENT: Pt with the diagnosis of T10 spinal fx with SCI, paraplegia. loss of BLE sensation and motor function after a fall.  9/4-Pt is on a full liquid diet at this time due to ileus. Meal completion is 100%. Pt reports she usually has a good appetite, however at this time she has not been feeling hungry due to her ileus and not being able to make a full BM. Spoke with RN, she reports pt has not been able to fully make a BM in 2 weeks. They are giving an enema to pt today to help with the relief. Pt reports no recent weight loss. Pt however is identified by a low braden score, which puts her in risk of skin breakdown. Pt reports she is willing to try glucerna, but would not like it now due to her ileus. Will revisit next time and order glucerna to pt's liking. Pt with no observed significant fat or muscle mass loss.  9/10- Ileus has resolved. Pt reports no stomach pains. Pt reports her appetite is "ok". Pt reports she has been feeling nauseous at times. Meal completion has varied from 15-100%. Pt refused supplements. Pt reports when she is not nauseous, she eats great. Pt was encouraged to eat her food at meals.  9/16-Pt reports her appetite has been coming and going depending on her state of nausea. Meal completion has varied from 50-100%. Pt reports she does not want any supplements at this time.   Height: Ht Readings from Last 1 Encounters:  02/17/14 '5\' 4"'  (1.626 m)   Weight: Wt Readings from Last 1 Encounters:  03/02/14 205 lb 8 oz (93.214 kg)   BMI:  Body mass  index is 35.26 kg/(m^2). Class II obesity  Re-Estimated Nutritional Needs: Kcal: 1800-2100 Protein: 80-90 grams Fluid: 1.8 - 2.1 L/day  Skin: +2 RLE, +2 LLE edema, stage II pressure ulcer on coccyx.  Diet Order: Carb Control    Intake/Output Summary (Last 24 hours) at 03/09/14 0953 Last data filed at 03/08/14 1700  Gross per 24 hour  Intake    480 ml  Output      2 ml  Net    478 ml    Last BM: 9/15  Labs:  No results found for this basename: NA, K, CL, CO2, BUN, CREATININE, CALCIUM, MG, PHOS, GLUCOSE,  in the last 168 hours  CBG (last 3)   Recent Labs  03/08/14 1621 03/08/14 2148 03/09/14 0714  GLUCAP 161* 138* 165*    Scheduled Meds: . antiseptic oral rinse  7 mL Mouth Rinse BID  . bisacodyl  10 mg Rectal Q0600  . diltiazem  60 mg Oral 4 times per day  . fentaNYL  12.5 mcg Transdermal Q72H  . heparin  5,000 Units Subcutaneous 3 times per day  . insulin aspart  0-15 Units Subcutaneous TID WC  . insulin aspart  0-5 Units Subcutaneous QHS  . insulin glargine  18 Units Subcutaneous QHS  . loratadine  10 mg Oral Daily  . metFORMIN  500 mg Oral BID WC  . pantoprazole  40 mg Oral Q1200  Continuous Infusions:    Past Medical History  Diagnosis Date  . Diabetes mellitus   . Hypertension   . Arthritis     Past Surgical History  Procedure Laterality Date  . Abdominal hysterectomy    . Posterior lumbar fusion 4 level N/A 02/17/2014    Procedure: Thoracolumbar Repair;  Surgeon: Erline Levine, MD;  Location: [facility];  Service: Neurosurgery;  Laterality: N/A;  Thoracolumbar Repair T8 - T12    Kallie Locks, MS, Provisional LDN Pager # [phone] After hours/ weekend pager # [phone]

## 2014-03-09 NOTE — Progress Notes (Signed)
[facility]     PROGRESS NOTE    Subjective/Complaints: Feeling fairly well. Pain under control. Slept last night. A  review of systems has been performed and if not noted above is otherwise negative.   Objective: Vital Signs: Blood pressure 122/59, pulse 64, temperature 98.3 F (36.8 C), temperature source Oral, resp. rate 18, weight 93.214 kg (205 lb 8 oz), SpO2 97.00%. Dg Abd 1 View  03/07/2014   CLINICAL DATA:  Nausea and vomiting.  EXAM: ABDOMEN - 1 VIEW  COMPARISON:  02/26/2014.  FINDINGS: Normal bowel gas pattern. Thoracolumbar spine fixation hardware. Extensive lumbar spine degenerative changes. Atheromatous arterial calcifications.  IMPRESSION: No acute abnormality.   Electronically Signed   By: Enrique Sack M.D.   On: 03/07/2014 17:42   No results found for this basename: WBC, HGB, HCT, PLT,  in the last 72 hours No results found for this basename: NA, K, CL, CO, GLUCOSE, BUN, CREATININE, CALCIUM,  in the last 72 hours CBG (last 3)   Recent Labs  03/08/14 1621 03/08/14 2148 03/09/14 0714  GLUCAP 161* 138* 165*    Wt Readings from Last 3 Encounters:  03/02/14 93.214 kg (205 lb 8 oz)  02/17/14 94.7 kg (208 lb 12.4 oz)  02/17/14 94.7 kg (208 lb 12.4 oz)    Physical Exam:  Constitutional: She is oriented to person, place, and time. She appears well-developed.  HENT: oral mucosa pink/moist  Head: Normocephalic.  Eyes: EOM are normal.  Neck: Normal range of motion. Neck supple. No thyromegaly present.  Cardiovascular: Normal rate and regular rhythm. No murmurs  Respiratory: Effort normal and breath sounds normal. No respiratory distress. No wheezes  GI: Soft. Bowel sounds are normal. She exhibits less distension with improved bowel sounds. Abdomen still large. Back incision clean.   Neuro: alert and oriented. Cognitively appears appropriate  T12 sensory level---0/2 sensation below the level 0/5 strength LLE.   Trace+ movement Right  HF, KE, tr at right ankle. No resting tone.  UE motor 5/5, UE sensory normal. CN intact. Cognition appropriate  Psych: normal Skin: small stage 2 near sacrum/coccyx. 1cm x 0.20cm   Assessment/Plan: 1. Functional deficits secondary to T10 fx/dislocation with complete SCI which require 3+ hours per day of interdisciplinary therapy in a comprehensive inpatient rehab setting. Physiatrist is providing close team supervision and 24 hour management of active medical problems listed below. Physiatrist and rehab team continue to assess barriers to discharge/monitor patient progress toward functional and medical goals.    vestibular assessment for nausea/vertigo  .  FIM: FIM - Bathing Bathing Steps Patient Completed: Chest;Right Arm;Left Arm;Abdomen;Front perineal area;Right upper leg;Left upper leg Bathing: 3: Mod-Patient completes 5-7 92f 10 parts or 50-74% (Using LH sponge)  FIM - Upper Body Dressing/Undressing Upper body dressing/undressing steps patient completed: Thread/unthread right sleeve of pullover shirt/dresss;Thread/unthread left sleeve of pullover shirt/dress;Put head through opening of pull over shirt/dress Upper body dressing/undressing: 4: Min-Patient completed 75 plus % of tasks FIM - Lower Body Dressing/Undressing Lower body dressing/undressing: 1: Total-Patient completed less than 25% of tasks  FIM - Toileting Toileting: 1: Two helpers  FIM - Air cabin crew Transfers: 0-Activity did not occur  FIM - Control and instrumentation engineer Devices: HOB elevated;Arm rests;Bed rails;Sliding board (4" step) Bed/Chair Transfer: 1: Two helpers  FIM - Locomotion: Wheelchair Distance: 150 Locomotion: Wheelchair: 2: [place] 32 - 149 ft with supervision, cueing or coaxing FIM - Locomotion: Ambulation Locomotion: Ambulation: 0: Activity did not occur  Comprehension Comprehension Mode:  Auditory Comprehension: 5-Understands basic 90% of the time/requires cueing  < 10% of the time  Expression Expression Mode: Verbal Expression: 5-Expresses basic 90% of the time/requires cueing < 10% of the time.  Social Interaction Social Interaction: 5-Interacts appropriately 90% of the time - Needs monitoring or encouragement for participation or interaction.  Problem Solving Problem Solving: 5-Solves basic 90% of the time/requires cueing < 10% of the time  Memory Memory: 5-Recognizes or recalls 90% of the time/requires cueing < 10% of the time Medical Problem List and Plan:  1. Functional deficits secondary to T10 fracture with complete paraplegia after a fall. Status post T8-T12 fusion laminectomy decompression  2. DVT Prophylaxis/Anticoagulation:   lovenox 40q12. Dopplers neg 3. Pain Management: Hydrocodone and Robaxin as needed.  -fentanyl patch   4. Mood/anxiety: Xanax as needed. Provide emotional support   - neuropsych  5. Neuropsych: This patient is capable of making decisions on her own behalf.  6. Skin/Wound Care: Routine skin checks of surgical site and pressure areas  7. Hypertension/atrial fibrillation. Presently on Cardizem 60 mg 4 times a day. Cardiac rate control. A chest pain or shortness of breath.  8. Diabetes mellitus with peripheral neuropathy. Hemoglobin A1c 7.9. Presently with sliding scale insulin. Check blood sugars a.c. and at bedtime/  -sugars improving  -continue glucophage  500mg  bid.    -increased scheduled lantus to 18u qhs    -continue CM diet  9. Neurogenic bladder:  foley for now---consider voiding trial but given severity of injury, her prognosis for return is poor.  10. Neuro bowel:   -still struggling with bowel program (loose bm last night)  -am supp           LOS (Days) 14 A FACE TO FACE EVALUATION WAS PERFORMED  SWARTZ,ZACHARY T 03/09/2014 8:11 AM

## 2014-03-09 NOTE — Progress Notes (Signed)
Occupational Therapy Session Note  Patient Details  Name: Yolanda Solis MRN: [id number] Date of Birth: 07-14-1933  Today's Date: 03/09/2014 OT Individual Time: 0800-0900 OT Individual Time Calculation (min): 60 min   Short Term Goals: Week 2:  OT Short Term Goal 1 (Week 2): Pt will complete bed rolls with mod assist to manage legs during assisted BADL OT Short Term Goal 2 (Week 2): Pt will incorporate use of appropriate AE to improve participation in lower body BADL OT Short Term Goal 3 (Week 2): Pt will demonstrate ability to direct caregivers to assist in pressure relief while maintaining back precautions 100% of the time OT Short Term Goal 4 (Week 2): Pt will retreive beverage or food item from refrigerator at w/c level using AE, prn OT Short Term Goal 5 (Week 2): Pt will demo ability to perform HEP for 30 minutes to mprove endurance and UE strengthening with supervision  Skilled Therapeutic Interventions/Progress Updates:  Patient resting in bed upon arrival.  Engaged in self care retraining to include bathing, dressing and grooming tasks.  Focused session on patient guiding caregiver, adhering to back precautions during all mobility with BADL tasks, use of AE and adaptive techniques to complete LB dressing.  All UB bathing and dressing performed in supine and rolling. Patient attempted to bathe peri area with HOB up ~30 degrees however unable to thoroughly clean.  Unable to locate patients LH sponge to practice bathing BLEs.  Patient used reacher and adaptive techniques to donn pants over feet with HOB up and TLSO in place.  Patient requires mod-max cues to guide caregiver and to adhere to back precautions.  Therapy Documentation Precautions:  Precautions Precautions: Fall;Back Required Braces or Orthoses: Spinal Brace Spinal Brace: Thoracolumbosacral orthotic;Applied in supine position Restrictions Weight Bearing Restrictions: No Pain: 2/10 in mid back, premedicated, rest and  repositioned ADL: See FIM for current functional status  Therapy/Group: Individual Therapy  Chad Donoghue 03/09/2014, 10:16 AM

## 2014-03-09 NOTE — Progress Notes (Signed)
Note/chart reviewed.  Akiya Morr Barnett RD, LDN Inpatient Clinical Dietitian Pager: [phone] After Hours Pager: [phone]  

## 2014-03-09 NOTE — Progress Notes (Signed)
Occupational Therapy Session Note  Patient Details  Name: Yolanda Solis MRN: [id number] Date of Birth: September 20, 1933  Today's Date: 03/09/2014  Short Term Goals: Week 1:  OT Short Term Goal 1 (Week 1): Pt will complete bed rolls with mod assist to manage legs during assisted BADL OT Short Term Goal 1 - Progress (Week 1): Progressing toward goal OT Short Term Goal 2 (Week 1): Pt will demo ability to wash upper body with min assist for thoroughness while supine in bed OT Short Term Goal 2 - Progress (Week 1): Met OT Short Term Goal 3 (Week 1): Pt will demo abiltiy to perform lateral leans for toilet hygiene with mod assist to manage legs OT Short Term Goal 3 - Progress (Week 1): Not progressing OT Short Term Goal 4 (Week 1): Pt will demo ability to don upper body clothing with HOB elevated and mod assist OT Short Term Goal 4 - Progress (Week 1): Met OT Short Term Goal 5 (Week 1): Pt will perform 4 of 5 grooming tasks at w/c level with setup OT Short Term Goal 5 - Progress (Week 1): Met  Week 2:  OT Short Term Goal 1 (Week 2): Pt will complete bed rolls with mod assist to manage legs during assisted BADL OT Short Term Goal 2 (Week 2): Pt will incorporate use of appropriate AE to improve participation in lower body BADL OT Short Term Goal 3 (Week 2): Pt will demonstrate ability to direct caregivers to assist in pressure relief while maintaining back precautions 100% of the time OT Short Term Goal 4 (Week 2): Pt will retreive beverage or food item from refrigerator at w/c level using AE, prn OT Short Term Goal 5 (Week 2): Pt will demo ability to perform HEP for 30 minutes to mprove endurance and UE strengthening with supervision  Skilled Therapeutic Interventions/Progress Updates:  1030-1130 - 60 Minutes Individual Therapy Patient with no direct complaints of pain Patient received supine in bed. Pt performed supine to sit t/f by rolling to L side w/ total A x 2. Pt worked on donning socks and  shoes w/ sock aid, shoe horn, and reacher and mod-max A to maintain dynamic sitting balance EOB. Patient transferred EOB>w/c using slide board with max assist +2 and total verbal cueing for safe & effective transfer. Therapist assisted with positioning patient in w/c, then pt self-propelled w/c to rehab workroom to reach for and retrieve timer for self-management of pressure relief. Pt educated on and therapist demonstrated pressure relief techniques of boosting in w/c every 20-30 min for 2-3 min, when up in chair. Pt self-propelled back to room, t/f to bed w/ slide board and then to supine w/ total x 2. Pt left supine in bed w/ needs nearby and nursing staff aware of needed brief change.  Precautions:  Precautions Precautions: Fall;Back Required Braces or Orthoses: Spinal Brace Spinal Brace: Thoracolumbosacral orthotic;Applied in supine position Restrictions Weight Bearing Restrictions: No  ADL: ADL ADL Comments: see FIM  See FIM for current functional status  Kaheem Halleck 03/09/2014, 7:31 AM

## 2014-03-09 NOTE — Progress Notes (Signed)
Physical Therapy Session Note  Patient Details  Name: Yolanda Solis MRN: [id number] Date of Birth: [birth year]  Today's Date: 03/09/2014 PT Co-Treatment Time: 6160-7371 (co-tx w/ PT [name] [id number]) Time Calculation: 15 min  Short Term Goals: Week 2:  PT Short Term Goal 1 (Week 2): Pt will propel manual w/c x 150' req SBA and demonstrate ability to lock/unlock brakes.  PT Short Term Goal 2 (Week 2): Pt will be able to verbally instruct set up for sliding board transfer.  PT Short Term Goal 3 (Week 2): Pt will transfer w/c to/from mat req 1 person assist.  PT Short Term Goal 4 (Week 2): Pt will demonstrate ability to follow commands for trunk leaning during head-hips relationship PT Short Term Goal 5 (Week 2): Pt will demonstrate ability to sit body up in w/c by using arms on armrests to pull body forward   Skilled Therapeutic Interventions/Progress Updates:    Co-tx w/ PT [name], see her note for additional details of treatment session. Pt propelled w/c 150' w/ BUE, mod encouragement to rehab gym. Worked on seated trunk control/balance w/ reaching activity to anterior, L, R to mod limits of stability. Noted slightly increased trunk Control vs previous sessions. Pt handed off to [name] for remainder of PT session.   Therapy Documentation Precautions:  Precautions Precautions: Fall;Back Required Braces or Orthoses: Spinal Brace Spinal Brace: Thoracolumbosacral orthotic;Applied in supine position Restrictions Weight Bearing Restrictions: No General:   Vital Signs:   Pain:   Mobility:   Locomotion :    Trunk/Postural Assessment :    Balance:   Exercises:   Other Treatments:    See FIM for current functional status  Therapy/Group: Individual Therapy  [name] [name], PT, DPT 03/09/2014, 8:29 PM

## 2014-03-09 NOTE — Progress Notes (Signed)
Physical Therapy Session Note  Patient Details  Name: Yolanda Solis MRN: [id number] Date of Birth: 1933/10/05  Today's Date: 03/09/2014 PT Co-Treatment Time: 9-9:45 total co-treatment time with [name], PT. This PT, [name], continued treatment time 9:45-10.  Tx 2: Co-tx time with Benay Pillow for 1 hour.   Short Term Goals: Week 2:  PT Short Term Goal 1 (Week 2): Pt will propel manual w/c x 150' req SBA and demonstrate ability to lock/unlock brakes.  PT Short Term Goal 2 (Week 2): Pt will be able to verbally instruct set up for sliding board transfer.  PT Short Term Goal 3 (Week 2): Pt will transfer w/c to/from mat req 1 person assist.  PT Short Term Goal 4 (Week 2): Pt will demonstrate ability to follow commands for trunk leaning during head-hips relationship PT Short Term Goal 5 (Week 2): Pt will demonstrate ability to sit body up in w/c by using arms on armrests to pull body forward   Skilled Therapeutic Interventions/Progress Updates:    Therapeutic Activity: PT instructs pt in rolling L in bed with bedrail req mod A, L side lie to sit transfer with L bedrail req max A, scoot to R transfer with SB and step under feet req 2 person mod-max A. Pt is progressing in verbalizing set up for slideboard transfer, but continues to req max cues.   Therapeutic Exercise: PT instructs pt in LAQ with B legs dangling in w/c x 10 reps each. R LE demonstrates 2+/5 quad activity and L LE demonstrates trace quad activity.   W/C Management: PT instructs pt in w/c propulsion with B UEs x 150' req SBA and encouragement for endurance.   Tx 2: Therapeutic Activity: PT instructs pt in slideboard transfer bed to w/c to R with step under feet req mod-max A x 2. Pt shows improved weight shift forward but continues to have difficulty in which direction trunk leans for head-hips relationship.   Therapeutic Exercise: PT instructs pt in LAQ with B legs dangling in w/c x 10 reps each. R LE  demonstrates 3-/5 quad activity and L LE demonstrates 2+/5 quad activity with tapping.  Pt continues to demonstrate anxiety with functional mobility (slideboard transfers), but is slowly progressing with bed mobility. Pt's quads are demonstrating return, especially R side, but also L with tapping. D/C plan appears to have been changed to SNF. Continue per PT POC.   Therapy Documentation Precautions:  Precautions Precautions: Fall;Back Required Braces or Orthoses: Spinal Brace Spinal Brace: Thoracolumbosacral orthotic;Applied in supine position Restrictions Weight Bearing Restrictions: No  Pain: Pain Assessment Pain Assessment: 0-10 Pain Score: 2  Pain Type: Surgical pain Pain Location: Back Pain Orientation: Lower Pain Descriptors / Indicators: Aching Pain Intervention(s): Rest;Repositioned Multiple Pain Sites: No Tx 2: Pt c/o 1/10 pain in low back after therapy session.  See FIM for current functional status  Therapy/Group: Co-Treatment with [name], PT first session and co-treatment with Benay Pillow, OT second session.   [facility] M 03/09/2014, 9:30 AM

## 2014-03-09 NOTE — Patient Care Conference (Signed)
Inpatient RehabilitationTeam Conference and Plan of Care Update Date: 03/08/2014   Time: 11:24 AM    Patient Name: Yolanda Solis      Medical Record Number: [id number]  Date of Birth: 1934-06-13 Sex: Female         Room/Bed: 4W15C/4W15C-01 Payor Info: Payor: [employer] MEDICARE / Plan: AARP MEDICARE COMPLETE / Product Type: *No Product type* /    Admitting Diagnosis: t10 para  Admit Date/Time:  02/23/2014  6:29 PM Admission Comments: No comment available   Primary Diagnosis:  <principal problem not specified> Principal Problem: <principal problem not specified>  Patient Active Problem List   Diagnosis Date Noted  . SCI (spinal cord injury) 02/23/2014  . Fracture dislocation of thoracic spine 02/18/2014  . HTN (hypertension) 02/18/2014  . Type II or unspecified type diabetes mellitus without mention of complication, uncontrolled 02/18/2014  . Fracture of thoracic spine 02/17/2014    Expected Discharge Date: Expected Discharge Date: 03/15/14  Team Members Present: Physician leading conference: Dr. Alger Simons Social Worker Present: Alfonse Alpers, LCSW Nurse Present: Elliot Cousin, RN PT Present: Canary Brim, Lorriane Shire, PT OT Present: Salome Spotted, Artemio Aly, OT SLP Present: Weston Anna, SLP Other (Discipline and Name): Danne Baxter, RN [facility]) PPS Coordinator present : Daiva Nakayama, RN, [facility]     Current Status/Progress Goal Weekly Team Focus  Medical   slow neurological change, pain better. working on bowel program  see prior, wound care,   continue establishment of bowel program, contacture prevention   Bowel/Bladder   Foley catheter in place; Having loose stools; vomiting; bowel program 6 am  Managed bowel and bladder program  Monitor for nausea, encourage, continue with bladder program, monitor stools   Swallow/Nutrition/ Hydration   Poor po intake secondary to nausea; unable to keep food down     treat nausea prn, monitor for bowel problems    ADL's   Min A upper body B & D, Mod A lower body bathing, Total A lower body dress, mech assist transfers  Min Assist UB BADL, Max A LB BADL, Mod A slide board transfers (bed<>w/c & w/c <> BSC)  Bed mobility, adapted bathing and dressing skills, transfer skills,  BUE strengthening   Mobility   +2TotalA for SBT, MinA-S for w/c propulsion, Min-ModA for bed mobility  ModA bed mobility and transfers, MaxA for car transfers, (S) for w/c propulsion  standing tolerance w/ standing frame, NMR for LE   Communication             Safety/Cognition/ Behavioral Observations  No unsafe behaviors noted  mod I  Continue hourly rounding   Pain   Vicodin 2 tabs q4h prn pain effective; buring pain down left leg  < 4  Assess and treat for pain q shift and prn   Skin   New stage II to coccyx; on air overlay mattress  No further skin breakdown or infection with max assist  Assess skin q shift and prn    Rehab Goals Patient on target to meet rehab goals: Yes Rehab Goals Revised: some of pt's toileting goals were d/c'd due to post-op limitations. *See Care Plan and progress notes for long and short-term goals.  Barriers to Discharge: see prior    Possible Resolutions to Barriers:  see prior    Discharge Planning/Teaching Needs:  Pt is considering going to a SNF for a short time before trying to go to her son's home.  She is discussing this with her son and other family members.  Pt is open to this idea if this is the best situation for her continued recovery.  To be determined based on pt's d/c disposition.   Team Discussion:  Pt and nursing are still working on a bowel program.  Pt is not changing much from a sensory point of view.  PT has seen trace contractures on her right side, but it will be awhile before more progress is seen.  Pt to have vestibular eval on day of conference.  Pt is at supervision at w/c level for upper body and total assist for lower body with OT.  Needs support in sitting  balance.  Pt accepting of SNF per conversation with therapists, if that is the best disposition for her.  Pt continues to be nauseated, has a foley, and a stage II wound on her coccyx.  Nursing is addressing these needs.    Revisions to Treatment Plan:  None   Continued Need for Acute Rehabilitation Level of Care: The patient requires daily medical management by a physician with specialized training in physical medicine and rehabilitation for the following conditions: Daily direction of a multidisciplinary physical rehabilitation program to ensure safe treatment while eliciting the highest outcome that is of practical value to the patient.: Yes Daily medical management of patient stability for increased activity during participation in an intensive rehabilitation regime.: Yes Daily analysis of laboratory values and/or radiology reports with any subsequent need for medication adjustment of medical intervention for : Neurological problems;Post surgical problems  Rande Dario, Silvestre Mesi 03/09/2014, 11:24 AM

## 2014-03-10 ENCOUNTER — Inpatient Hospital Stay (HOSPITAL_COMMUNITY): Payer: Medicare Other | Admitting: Physical Therapy

## 2014-03-10 ENCOUNTER — Inpatient Hospital Stay (HOSPITAL_COMMUNITY): Payer: Medicare Other

## 2014-03-10 ENCOUNTER — Inpatient Hospital Stay (HOSPITAL_COMMUNITY): Payer: Medicare Other | Admitting: Occupational Therapy

## 2014-03-10 LAB — GLUCOSE, CAPILLARY
Glucose-Capillary: 152 mg/dL — ABNORMAL HIGH (ref 70–99)
Glucose-Capillary: 179 mg/dL — ABNORMAL HIGH (ref 70–99)
Glucose-Capillary: 150 mg/dL — ABNORMAL HIGH (ref 70–99)
Glucose-Capillary: 151 mg/dL — ABNORMAL HIGH (ref 70–99)

## 2014-03-10 MED ORDER — SACCHAROMYCES BOULARDII 250 MG PO CAPS
250.0000 mg | ORAL_CAPSULE | Freq: Two times a day (BID) | ORAL | Status: DC
  Administered 2014-03-10 – 2014-03-14 (×9): 250 mg via ORAL
  Filled 2014-03-10 (×11): qty 1

## 2014-03-10 NOTE — Consult Note (Signed)
NEUROCOGNITIVE [place]   Yolanda Solis is a 80 year old, right-handed woman, who was seen for a brief neurocognitive status examination to evaluate her emotional state and mental status post-fall.  According to her medical record, she was admitted on 02/18/2014 after an unwitnessed fall at home complaining of weakness to bilateral lower extremities.  She denied loss of consciousness. Evaluation upon admission revealed mild cord compression as well as cord edema.  She underwent a fusion with laminectomy and decompression of spinal cord on 02/18/14.    Emotional Functioning:  During the clinical interview, [name] stated that physical therapy has been "a little rough" due to pain and fear of falling, but she denied major symptoms of depression, including suicidal ideation.  In fact, she stated that she does not have any major concerns or worries at this point.  She commented that she has a lot of social support and she has been successful in using prayer to cope with the frustrations associated with loss of independence.  [name] responses to self-report measures of mood symptoms were not suggestive of the presence of clinically significant depression or anxiety at this time.   Mental Status:  [name] total score on an overall measure of mental status was at the cutoff used to indicate the presence of significant cognitive disruption (MMSE-2 brief = 12/16).  She lost points for misstating the date and season of the year, as well as the floor of the building that she was on.  In addition, she was unable to freely recall one of three previously studied words after a brief delay.  Subjectively, she denied noticing cognitive changes.    Impressions and Recommendations:  [name] total score on a very brief measure of mental status was on the borderline to suggest marked cognitive impairment.  However, most of her points were lost  for very minor disorientation, which can occur during a hospital stay.  She was never grossly off in her responses to orientation questions.  Therefore, it is likely that her borderline score was incidental.  However, should she develop worsening or persisting cognitive symptoms, a more thorough neurocognitive evaluation could be conducted either on this unit or post-discharge as an outpatient.  From an emotional standpoint, [name] appears to be coping well with her current situation.  There was no indication of clinically significant mood disruption at this time.  Still, continued follow-up for support could be provided, should her mood deteriorate or should her treatment team feel that it would be beneficial.    DIAGNOSIS: Spinal cord injury  Marlane Hatcher, Psy.D.  Clinical Neuropsychologist

## 2014-03-10 NOTE — Progress Notes (Signed)
Physical Therapy Weekly Progress Note  Patient Details  Name: Yolanda Solis MRN: [id number] Date of Birth: 11-May-1934  Beginning of progress report period: March 04, 2014 End of progress report period: March 10, 2014  Today's Date: 03/10/2014 PT Individual Time: 0900-1000 Tx 2: 1500-1530 PT Individual Time Calculation (min): 60 min Tx 2: 30 minutes  Patient has met 2 of 5 short term goals.    Patient continues to demonstrate the following deficits: Difficulty with transfers, inability to ambulate, B LE paraplegia, impaired static and dynamic sitting balance, difficulty with w/c management and therefore will continue to benefit from skilled PT intervention to enhance overall performance with activity tolerance, balance, postural control, ability to compensate for deficits, functional use of  right lower extremity and left lower extremity, awareness and coordination.  Patient progressing toward long term goals..  Continue plan of care.  PT Short Term Goals Week 2:  PT Short Term Goal 1 (Week 2): Pt will propel manual w/c x 150' req SBA and demonstrate ability to lock/unlock brakes.  PT Short Term Goal 1 - Progress (Week 2): Met PT Short Term Goal 2 (Week 2): Pt will be able to verbally instruct set up for sliding board transfer.  PT Short Term Goal 2 - Progress (Week 2): Partly met PT Short Term Goal 3 (Week 2): Pt will transfer w/c to/from mat req 1 person assist.  PT Short Term Goal 3 - Progress (Week 2): Progressing toward goal PT Short Term Goal 4 (Week 2): Pt will demonstrate ability to follow commands for trunk leaning during head-hips relationship PT Short Term Goal 4 - Progress (Week 2): Progressing toward goal PT Short Term Goal 5 (Week 2): Pt will demonstrate ability to sit body up in w/c by using arms on armrests to pull body forward  PT Short Term Goal 5 - Progress (Week 2): Met Week 3:  PT Short Term Goal 1 (Week 3): Pt will be able to verbally instruct set up for  sliding board transfer.  PT Short Term Goal 2 (Week 3): Pt will transfer w/c to/from mat req 1 person assist.  PT Short Term Goal 3 (Week 3): Pt will demonstrate ability to follow commands for trunk leaning during head-hips relationship PT Short Term Goal 4 (Week 3): Pt will complete supine to/from sit transfer req mod A.  PT Short Term Goal 5 (Week 3): Pt will self propel manual w/c up ramp req min A.   Skilled Therapeutic Interventions/Progress Updates:    Therapeutic Activity: Pt demonstrates ability to partially direct set up for slideboard transfer including: wheel me closer to the bed, remove the legrests, swing the armrest back, put the step under my feet, and put the board under my bottom. She forgot: lock my brakes, remove the pink safety belt, and bring the foley catheter bag with me. During the slideboard transfer, pt req 2 person assist still, but is very close to only req 1 person assist. She has difficult time following head hips relationship from TIS w/c to bed, but does an excellent job following head-hips from bed to w/c, also a 2 person assist.  PT and Tech complete transfer from manual w/c to bed with Maxi Sky lift so pt has pressure relief on her bottom at the end of the treatment.   W/C Management: Pt demonstrates the ability to self propel x 150' x 2 with B UEs req SBA in straight path, with a turn, doing a u-turn, and locking/unlocking B brakes, req verbal cues  to look at her brakes (which causes her to weight shift and reach them).   Tx 2:  Therapeutic Activity: PT and PT Tech complete Maxi Lift transfer from manual w/c to bed. PT instructs pt in rolling R with bedrail req mod A, while PT removes sling.   Therapeutic Exercise: PT instructs pt in B LE ROM and strengthening exercises: heel slides with min-mod resistance on descent and P/AAROM on ascent x 10 reps, hip IR/ER with knee straight AAROM x 10 reps - B LE.   Pt is demonstrating more return in her B LEs, but more in  extension patterns than flexion patterns. Pt c/o nausea prior to PT and RN gave pt medication for this. Pt is continuing to make slow, but meaningful gains in function; she is very close to being a 1 person slideboard assist with transfers. Pt will benefit from additional time on inpatient rehab unit in order to change d/c destination to home with 2 family member assist rather than SNF. Continue per PT POC.   Therapy Documentation Precautions:  Precautions Precautions: Fall;Back Required Braces or Orthoses: Spinal Brace Spinal Brace: Thoracolumbosacral orthotic;Applied in supine position Restrictions Weight Bearing Restrictions: No  Pain: Pain Assessment Pain Assessment: 0-10 Pain Score: 2  Pain Type: Surgical pain Pain Location: Back Pain Orientation: Lower Pain Descriptors / Indicators: Aching Pain Onset: On-going Pain Intervention(s): Rest;Repositioned Multiple Pain Sites: No Tx 2: Pt denies pain during second session.  See FIM for current functional status  Therapy/Group: Individual Therapy with PT Tech, Kelvin, for +2 during first and part of second treatment.   [facility] M 03/10/2014, 9:03 AM

## 2014-03-10 NOTE — Progress Notes (Signed)
Occupational Therapy Session Note  Patient Details  Name: Yolanda Solis MRN: [id number] Date of Birth: 1934/01/23  Today's Date: 03/10/2014 OT Individual Time: 9024-0973 OT Individual Time Calculation (min): 60 min    Short Term Goals: Week 2:  OT Short Term Goal 1 (Week 2): Pt will complete bed rolls with mod assist to manage legs during assisted BADL OT Short Term Goal 2 (Week 2): Pt will incorporate use of appropriate AE to improve participation in lower body BADL OT Short Term Goal 3 (Week 2): Pt will demonstrate ability to direct caregivers to assist in pressure relief while maintaining back precautions 100% of the time OT Short Term Goal 4 (Week 2): Pt will retreive beverage or food item from refrigerator at w/c level using AE, prn OT Short Term Goal 5 (Week 2): Pt will demo ability to perform HEP for 30 minutes to mprove endurance and UE strengthening with supervision  Skilled Therapeutic Interventions/Progress Updates: ADL-retraining with focus on improved bed mobility during assisted BADL while supine, skilled use of AE (LH sponge), and improved ability to assume directive role during assisted BADL.    Pt received supine in bed, alert and reporting minimal discomfort after sleeping well last night.  Pt endorses plan for transfer to SNF prior to residing with her son, who continues to modify his home in preparation for her eventual transition to living with him.   Pt was encouraged to provide direction to OT who assisted only as directed during this session.   Pt required moderate verbal cues to continue directing through entire sequence of assisted bathing and dressing to include assisting with log rolls (min assist) and managing LE during perineal cleaning.   Pt recalled approx 50% of B&D tasks while directing but is now consistently able to bathe upper body and perineal area with only setup assist and bathe her lower body partially (upper and lower legs, anterior) using LH sponge.     Mech lift transfer completed to tilt-in-space w/c.   Pt setup with food tray for self-feeding with safety belt affixed and call light and phone within reach.      Therapy Documentation Precautions:  Precautions Precautions: Fall;Back Required Braces or Orthoses: Spinal Brace Spinal Brace: Thoracolumbosacral orthotic;Applied in supine position Restrictions Weight Bearing Restrictions: No  Pain: Pain Assessment Pain Assessment: 0-10 Pain Score: 2  Pain Type: Surgical pain Pain Location: Back Pain Orientation: Lower Pain Descriptors / Indicators: Aching Pain Onset: On-going Pain Intervention(s): Rest;Repositioned Multiple Pain Sites: No  ADL: ADL ADL Comments: see FIM  See FIM for current functional status  Therapy/Group: Individual Therapy  Second session: Time: 1030-1100 Time Calculation (min):  30 min  Pain Assessment: 5/10, neck pain  Skilled Therapeutic Interventions: Therapeutic exercises supine in bed.   Pt received resting supine in her bed after physical therapy session,  with complaint of mild neck pain and requesting to remain in bed for planned session due to her concern for excessive fatigue while awaiting resumption of scheduled therapies later in the afternoon.   Pt was re-educated on theraband exercises and performed 1 set of 10 reps for all 6 exercises presented for upper body strengthening.   Pt requires setup assist, continuous supervision to progress through written exercises and intermittent hand guidance to perform exercises correctly.    See FIM for current functional status  Therapy/Group: Individual Therapy  Tommy Minichiello 03/10/2014, 9:43 AM

## 2014-03-10 NOTE — Progress Notes (Signed)
Social Work Patient ID: Yolanda Solis, female   DOB: 1934-03-27, 80 y.o.   MRN: [id number]  CSW met with pt and then spoke with pt's son, Jori Moll, via telephone to update them on team conference.  Pt has been considering the idea of SNF tx prior to going home after talking to her therapists.  Pt's son was aware of this as he had spoken with his mother about it, as well.  CSW explained the process and he discussed preferences of SNFs with CSW.  Pt has family at a facility closer to home and would like to pursue that SNF.  CSW to work on this for pt.

## 2014-03-10 NOTE — Progress Notes (Signed)
[facility] PHYSICAL MEDICINE & REHABILITATION     PROGRESS NOTE    Subjective/Complaints: In good spirits. Denies any new problems. Belly feels good A  review of systems has been performed and if not noted above is otherwise negative.   Objective: Vital Signs: Blood pressure 123/59, pulse 64, temperature 97.9 F (36.6 C), temperature source Oral, resp. rate 18, weight 92.987 kg (205 lb), SpO2 97.00%. No results found. No results found for this basename: WBC, HGB, HCT, PLT,  in the last 72 hours No results found for this basename: NA, K, CL, CO, GLUCOSE, BUN, CREATININE, CALCIUM,  in the last 72 hours CBG (last 3)   Recent Labs  03/09/14 1631 03/09/14 2105 03/10/14 0636  GLUCAP 173* 158* 152*    Wt Readings from Last 3 Encounters:  03/09/14 92.987 kg (205 lb)  02/17/14 94.7 kg (208 lb 12.4 oz)  02/17/14 94.7 kg (208 lb 12.4 oz)    Physical Exam:  Constitutional: She is oriented to person, place, and time. She appears well-developed.  HENT: oral mucosa pink/moist  Head: Normocephalic.  Eyes: EOM are normal.  Neck: Normal range of motion. Neck supple. No thyromegaly present.  Cardiovascular: Normal rate and regular rhythm. No murmurs  Respiratory: Effort normal and breath sounds normal. No respiratory distress. No wheezes  GI: Soft. Bowel sounds are normal. She exhibits less distension with improved bowel sounds. Abdomen still large. Back incision clean.   Neuro: alert and oriented. Cognitively appears appropriate  T12 sensory level---0/2 sensation below the level 0/5 strength LLE.   Trace+ movement Right HF, KE, tr at right ankle. No resting tone.  UE motor 5/5, UE sensory normal. CN intact. Cognition appropriate  Psych: normal Skin: small stage 2 near sacrum/coccyx. 1cm x 0.20cm   Assessment/Plan: 1. Functional deficits secondary to T10 fx/dislocation with complete SCI which require 3+ hours per day of interdisciplinary therapy in a comprehensive inpatient rehab  setting. Physiatrist is providing close team supervision and 24 hour management of active medical problems listed below. Physiatrist and rehab team continue to assess barriers to discharge/monitor patient progress toward functional and medical goals.     FIM: FIM - Bathing Bathing Steps Patient Completed: Chest;Right Arm;Left Arm;Abdomen Bathing: 2: Max-Patient completes 3-4 61f 10 parts or 25-49% (unable to locate LH sponges)  FIM - Upper Body Dressing/Undressing Upper body dressing/undressing steps patient completed: Thread/unthread right sleeve of pullover shirt/dresss;Thread/unthread left sleeve of pullover shirt/dress;Put head through opening of pull over shirt/dress Upper body dressing/undressing: 4: Min-Patient completed 75 plus % of tasks (total assist for TLSO in supine) FIM - Lower Body Dressing/Undressing Lower body dressing/undressing: 1: Total-Patient completed less than 25% of tasks (attempted to use reacher to donn pants)  FIM - Toileting Toileting: 1: Two helpers  FIM - Air cabin crew Transfers: 0-Activity did not occur  FIM - Control and instrumentation engineer Devices: Sliding board;Arm rests;Bed rails;Orthosis (TLSO) Bed/Chair Transfer: 2: Supine > Sit: Max A (lifting assist/Pt. 25-49%);1: Two helpers  FIM - Locomotion: Wheelchair Distance: 150 Locomotion: Wheelchair: 5: Travels 150 ft or more: maneuvers on rugs and over door sills with supervision, cueing or coaxing FIM - Locomotion: Ambulation Locomotion: Ambulation: 0: Activity did not occur  Comprehension Comprehension Mode: Auditory Comprehension: 5-Understands complex 90% of the time/Cues < 10% of the time  Expression Expression Mode: Verbal Expression: 6-Expresses complex ideas: With extra time/assistive device  Social Interaction Social Interaction: 5-Interacts appropriately 90% of the time - Needs monitoring or encouragement for participation or interaction.  Problem  Solving Problem Solving: 5-Solves basic 90% of the time/requires cueing < 10% of the time  Memory Memory: 4-Recognizes or recalls 75 - 89% of the time/requires cueing 10 - 24% of the time Medical Problem List and Plan:  1. Functional deficits secondary to T10 fracture with complete paraplegia after a fall. Status post T8-T12 fusion laminectomy decompression  2. DVT Prophylaxis/Anticoagulation:   lovenox 40q12. Dopplers neg 3. Pain Management: Hydrocodone and Robaxin as needed.  -fentanyl patch   4. Mood/anxiety: Xanax as needed. Provide emotional support   - neuropsych  5. Neuropsych: This patient is capable of making decisions on her own behalf.  6. Skin/Wound Care: Routine skin checks of surgical site and pressure areas  7. Hypertension/atrial fibrillation. Presently on Cardizem 60 mg 4 times a day. Cardiac rate control. A chest pain or shortness of breath.  8. Diabetes mellitus with peripheral neuropathy. Hemoglobin A1c 7.9. Presently with sliding scale insulin. Check blood sugars a.c. and at bedtime/  -sugars improving  -continue glucophage  500mg  bid.    -  scheduled lantus   18u qhs    -continue CM diet  9. Neurogenic bladder:  foley for now---consider voiding trial but given severity of injury, her prognosis for return is poor.  10. Neuro bowel:   -continue to work on AM suppository  -am supp           LOS (Days) 15 A FACE TO FACE EVALUATION WAS PERFORMED  Yolanda Solis T 03/10/2014 8:39 AM

## 2014-03-10 NOTE — Progress Notes (Signed)
Occupational Therapy Session Note  Patient Details  Name: Yolanda Solis MRN: [id number] Date of Birth: 1934/02/10  Today's Date: 03/10/2014 OT Individual Time: 2876-8115 OT Individual Time Calculation (min): 60 min    Short Term Goals: Week 2:  OT Short Term Goal 1 (Week 2): Pt will complete bed rolls with mod assist to manage legs during assisted BADL OT Short Term Goal 2 (Week 2): Pt will incorporate use of appropriate AE to improve participation in lower body BADL OT Short Term Goal 3 (Week 2): Pt will demonstrate ability to direct caregivers to assist in pressure relief while maintaining back precautions 100% of the time OT Short Term Goal 4 (Week 2): Pt will retreive beverage or food item from refrigerator at w/c level using AE, prn OT Short Term Goal 5 (Week 2): Pt will demo ability to perform HEP for 30 minutes to mprove endurance and UE strengthening with supervision  Skilled Therapeutic Interventions/Progress Updates:   Treatment focus on bed mobility and increased participation in LB self-care and dynamic sitting balance.  Pt in bed upon arrival, requested therapist to check her brief and she may have "done something".  Noted mucous like substance in brief (likely coming from rectum), notified RN and then completed hygiene and pulling up pants with rolling side to side.  Encouraged pt to direct care during rolling, able to direct caregivers to position BLE to assist in rolling and utilized bed rails to assist in rolling.  Utilized maxi move to transfer bed > w/c.  In chair engaged in forward weight shift to improve sitting balance in w/c, requiring +2 for positioning and safety.  In w/c focused on forward weight shifting as needed for mobility and self-care tasks while reaching for items while engaging in horse shoe tossing activity.  Pt returned to room and left seated in w/c till PT session, upon return to room pt with episode of dry heaving - notified RN.  Therapy  Documentation Precautions:  Precautions Precautions: Fall;Back Required Braces or Orthoses: Spinal Brace Spinal Brace: Thoracolumbosacral orthotic;Applied in supine position Restrictions Weight Bearing Restrictions: No General:   Vital Signs: Therapy Vitals Pulse Rate: 82 BP: 134/74 mmHg Patient Position (if appropriate): Lying Pain:   Pt with no c/o pain ADL: ADL ADL Comments: see FIM  See FIM for current functional status  Therapy/Group: Individual Therapy  Simonne Come 03/10/2014, 3:02 PM

## 2014-03-11 ENCOUNTER — Inpatient Hospital Stay (HOSPITAL_COMMUNITY): Payer: Medicare Other

## 2014-03-11 ENCOUNTER — Inpatient Hospital Stay (HOSPITAL_COMMUNITY): Payer: Medicare Other | Admitting: Physical Therapy

## 2014-03-11 LAB — URINALYSIS, ROUTINE W REFLEX MICROSCOPIC
Glucose, UA: NEGATIVE mg/dL
KETONES UR: 15 mg/dL — AB
NITRITE: POSITIVE — AB
PROTEIN: 30 mg/dL — AB
Specific Gravity, Urine: 1.025 (ref 1.005–1.030)
Urobilinogen, UA: 1 mg/dL (ref 0.0–1.0)
pH: 5.5 (ref 5.0–8.0)

## 2014-03-11 LAB — GLUCOSE, CAPILLARY
GLUCOSE-CAPILLARY: 184 mg/dL — AB (ref 70–99)
Glucose-Capillary: 173 mg/dL — ABNORMAL HIGH (ref 70–99)
Glucose-Capillary: 184 mg/dL — ABNORMAL HIGH (ref 70–99)
Glucose-Capillary: 169 mg/dL — ABNORMAL HIGH (ref 70–99)

## 2014-03-11 LAB — URINE MICROSCOPIC-ADD ON

## 2014-03-11 NOTE — Progress Notes (Signed)
Physical Therapy Session Note  Patient Details  Name: Yolanda Solis MRN: [id number] Date of Birth: 08-Jul-1933  Today's Date: 03/11/2014 PT Individual Time: 0930-1030 Tx 2: 1300-1400 PT Individual Time Calculation (min): 60 min Tx 2: 60 min  Short Term Goals: Week 3:  PT Short Term Goal 1 (Week 3): Pt will be able to verbally instruct set up for sliding board transfer.  PT Short Term Goal 2 (Week 3): Pt will transfer w/c to/from mat req 1 person assist.  PT Short Term Goal 3 (Week 3): Pt will demonstrate ability to follow commands for trunk leaning during head-hips relationship PT Short Term Goal 4 (Week 3): Pt will complete supine to/from sit transfer req mod A.  PT Short Term Goal 5 (Week 3): Pt will self propel manual w/c up ramp req min A.   Skilled Therapeutic Interventions/Progress Updates:    Therapeutic Activity: Pt verbalizes ability to direct set up for slideboard transfer including: put my chair close to the bed, put the step under my feet, remove my legrests, lock my brakes, put the board under me, slide my armrest back (this one with a visual cue), and also verbalizing head-hips relationship correctly. Pt forgets: Tilt my wheelchair upright and move my foley bag over.   PT instructs pt in Max A transfer TIS w/c to bed scoot transfer with SB and in bed to manual w/c with SB req +2 assist for initial scooting, progressing to max A. Focus is on head-hips relationship and hand placement. These 2 transfers take 30 minutes.  At end of treatment session, pt transferred from manual w/c to TIS w/c with Maxi-Sky lift and RN agrees to tilt pt back significantly for bottom pressure relief after she gives pt medication.   W/C Management: PT instructs pt in self propelling manual w/c x 150' with B UEs req SBA and verbal cues for large arm stroke and safety awareness (swing wide before this turn so you don't cut your hand). PT instructs pt in propelling up/down a ramp with B UEs req mod A on  way up progressing to min A with improved coordination and min A on way down the ramp. Ramp activity done for 2 sets and req verbal and visual cues for technique. Pt demonstrates the ability to lock/unlock brakes with verbal cues to look at the brakes, which causes pt to lean laterally and improve her reach of the brakes.   Tx 2: Neuromuscular Reeducation: PT instructs pt in standing frame activity and pt tolerates standing x 8 minutes, then reports feeling as if she is hot, and so seated rest break is taken.  While up in standing, pt completes modified "wall push-ups" (pushing upper body away from tray with B UEs x 10 reps) and modified mini-squats (attempting to push legs straight while in stand 2 x 10 reps).  See vitals flow sheet: pt's BP and HR remain stable during standing.  PT instructs pt in dynamic sitting balance edge of bed transitioning back and forth between seated with arms propped on knees (forward) and seated with arms propped behind body leaning back x 10 reps. Pt is able to achieve static sit forward prop with close SBA and transition into back prop with CGA, but then req mod A to transition back into forward prop.   Therapeutic Activity: PT instructs pt in scoot transfer with SB from TIS w/c to bed downhill with step under feet req mod A x 1. Pt req 2 person assist for sit to supine:  1 person at trunk and 1 person at legs.   Therapeutic Exercise: PT instructs pt in resisted heel slides on descent and AAROM on ascent x 10 reps B LE, supine hip abduction/adduction AAROM x 10 reps B LE (R leg is able to adduct fully in gravity minimized position without assist), hip IR/ER in B hook lie x 10 reps with PT stabilizing feet AAROM.    Pt is continuing to progress in ability to transfer with slideboard and is very close to being a 1 person assist. Pt's ability to verbalize set up is improving daily and her knowledge of head-hips relationship is good, but implementing head-hips relationship  during the scoot transfer remains difficult at times due to pt's weak B UEs, pear shaped body, obesity, paraplegia, and impaired dynamic sit balance. Pt also has difficulty with hand placement (by hips for the "pushing" hand and flat on slideboard for the other hand) due to her tendency to pull on objects to maintain balance. Pt will benefit from additional dynamic sitting balance training, B LE ROM and strengthening exercises to promote neuromuscular return, continued transfer training, progressive standing in standing frame for weight bearing, habituation and adaptation exercises for R vestibular hypofunction, and bed mobility training.   Therapy Documentation Precautions:  Precautions Precautions: Fall;Back Required Braces or Orthoses: Spinal Brace Spinal Brace: Thoracolumbosacral orthotic;Applied in supine position Restrictions Weight Bearing Restrictions: No Pain: Pain Assessment Pain Assessment: 0-10 Pain Score: 2  Pain Type: Surgical pain Pain Location: Back Pain Orientation: Mid;Lower Pain Descriptors / Indicators: Dull Pain Onset: On-going Pain Intervention(s): Rest Multiple Pain Sites: No Tx 2: Pt reports 1/10 pain in mid low back at surgical site; PT utilizes rest and repositioning to reduce pain.   See FIM for current functional status  Therapy/Group: Co-Treatment with Jorge Mandril, PT as +2  [facility] M 03/11/2014, 10:03 AM

## 2014-03-11 NOTE — Progress Notes (Signed)
Physical Therapy Session Note  Patient Details  Name: Yolanda Solis MRN: [id number] Date of Birth: 1934/04/26  Today's Date: 03/11/2014 PT Individual Time: 1130-1203 PT Individual Time Calculation (min): 33 min   Short Term Goals: Week 2:  PT Short Term Goal 1 (Week 2): Pt will propel manual w/c x 150' req SBA and demonstrate ability to lock/unlock brakes.  PT Short Term Goal 1 - Progress (Week 2): Met PT Short Term Goal 2 (Week 2): Pt will be able to verbally instruct set up for sliding board transfer.  PT Short Term Goal 2 - Progress (Week 2): Partly met PT Short Term Goal 3 (Week 2): Pt will transfer w/c to/from mat req 1 person assist.  PT Short Term Goal 3 - Progress (Week 2): Progressing toward goal PT Short Term Goal 4 (Week 2): Pt will demonstrate ability to follow commands for trunk leaning during head-hips relationship PT Short Term Goal 4 - Progress (Week 2): Progressing toward goal PT Short Term Goal 5 (Week 2): Pt will demonstrate ability to sit body up in w/c by using arms on armrests to pull body forward  PT Short Term Goal 5 - Progress (Week 2): Met Week 3:  PT Short Term Goal 1 (Week 3): Pt will be able to verbally instruct set up for sliding board transfer.  PT Short Term Goal 2 (Week 3): Pt will transfer w/c to/from mat req 1 person assist.  PT Short Term Goal 3 (Week 3): Pt will demonstrate ability to follow commands for trunk leaning during head-hips relationship PT Short Term Goal 4 (Week 3): Pt will complete supine to/from sit transfer req mod A.  PT Short Term Goal 5 (Week 3): Pt will self propel manual w/c up ramp req min A.   Skilled Therapeutic Interventions/Progress Updates:   Pt received in tilt in space w/c with son present to observe.  Focus of therapy treatment session transfer training with slideboard with pt verbalizing w/c and slideboard set up to therapist with mod verbal cues to recall all steps in set up sequence; also focus on decreasing amount of  assistance for slideboard transfer to one person.  Once pt set up pt performed slideboard lateral scooting w/c <> mat to L and R on level surface with max A of one person to assist with balance, advancement of buttocks across board, adjustment of feet placement with verbal cues to maintain full anterior lean/weight shift during transfer and for use of UE to assist with scooting.  On mat also performed UE tricep and latissimus training with push up blocks to focus on shoulder depression and UE extension to assist with un-weighting buttocks.  Pt able to perform 6 reps and pt demonstrating improved sitting balance, weight shifting and ability to lift and clear hips from mat. Returned to w/c and pt able to use arm rests to scoot hips fully posterior in w/c.  Returned to room and pt set up with all items within reach for lunch.   Therapy Documentation Precautions:  Precautions Precautions: Fall;Back Required Braces or Orthoses: Spinal Brace Spinal Brace: Thoracolumbosacral orthotic;Applied in supine position Restrictions Weight Bearing Restrictions: No Pain: Pain Assessment Pain Assessment: No/denies pain Pain Score: 2  Pain Type: Surgical pain Pain Location: Back Pain Orientation: Mid;Lower Pain Descriptors / Indicators: Dull Pain Onset: On-going Pain Intervention(s): Rest Multiple Pain Sites: No  See FIM for current functional status  Therapy/Group: Individual Therapy  Raylene Everts [facility] 03/11/2014, 12:22 PM

## 2014-03-11 NOTE — Progress Notes (Signed)
[place] PHYSICAL MEDICINE & REHABILITATION     PROGRESS NOTE    Subjective/Complaints: Slept well. Pain under reasonable control. "getting stronger" A  review of systems has been performed and if not noted above is otherwise negative.   Objective: Vital Signs: Blood pressure 132/60, pulse 69, temperature 97.8 F (36.6 C), temperature source Oral, resp. rate 19, weight 92.987 kg (205 lb), SpO2 96.00%. No results found. No results found for this basename: WBC, HGB, HCT, PLT,  in the last 72 hours No results found for this basename: NA, K, CL, CO, GLUCOSE, BUN, CREATININE, CALCIUM,  in the last 72 hours CBG (last 3)   Recent Labs  03/10/14 1638 03/10/14 2040 03/11/14 0709  GLUCAP 150* 151* 173*    Wt Readings from Last 3 Encounters:  03/09/14 92.987 kg (205 lb)  02/17/14 94.7 kg (208 lb 12.4 oz)  02/17/14 94.7 kg (208 lb 12.4 oz)    Physical Exam:  Constitutional: She is oriented to person, place, and time. She appears well-developed.  HENT: oral mucosa pink/moist  Head: Normocephalic.  Eyes: EOM are normal.  Neck: Normal range of motion. Neck supple. No thyromegaly present.  Cardiovascular: Normal rate and regular rhythm. No murmurs  Respiratory: Effort normal and breath sounds normal. No respiratory distress. No wheezes  GI: Soft. Bowel sounds are normal. She exhibits less distension with improved bowel sounds. Abdomen still large. Back incision clean.   Neuro: alert and oriented. Cognitively appears appropriate  T12 sensory level---0/2 sensation below the level 0/5 strength LLE.   Trace+ movement Right HF, KE, tr at right ankle. No resting tone.  UE motor 5/5, UE sensory normal. CN intact. Cognition appropriate  Psych: normal Skin: small stage 2 near sacrum/coccyx. 1cm x 0.20cm   Assessment/Plan: 1. Functional deficits secondary to T10 fx/dislocation with complete SCI which require 3+ hours per day of interdisciplinary therapy in a comprehensive inpatient rehab  setting. Physiatrist is providing close team supervision and 24 hour management of active medical problems listed below. Physiatrist and rehab team continue to assess barriers to discharge/monitor patient progress toward functional and medical goals.     FIM: FIM - Bathing Bathing Steps Patient Completed: Abdomen;Right upper leg;Front perineal area;Right Arm;Left Arm;Chest;Left upper leg Bathing: 3: Mod-Patient completes 5-7 21f 10 parts or 50-74%  FIM - Upper Body Dressing/Undressing Upper body dressing/undressing steps patient completed: Thread/unthread right sleeve of pullover shirt/dresss;Thread/unthread left sleeve of pullover shirt/dress;Put head through opening of pull over shirt/dress Upper body dressing/undressing: 4: Min-Patient completed 75 plus % of tasks FIM - Lower Body Dressing/Undressing Lower body dressing/undressing: 1: Total-Patient completed less than 25% of tasks  FIM - Toileting Toileting: 1: Two helpers  FIM - Air cabin crew Transfers: 0-Activity did not occur  FIM - Control and instrumentation engineer Devices: Sliding board;Arm rests;Bed rails;Orthosis (TLSO) Bed/Chair Transfer: 1: Bed > Chair or W/C: Total A (helper does all/Pt. < 25%);1: Chair or W/C > Bed: Total A (helper does all/Pt. < 25%);1: Two helpers;1: Mechanical lift  FIM - Locomotion: Wheelchair Distance: 150 Locomotion: Wheelchair: 5: Travels 150 ft or more: maneuvers on rugs and over door sills with supervision, cueing or coaxing FIM - Locomotion: Ambulation Locomotion: Ambulation: 0: Activity did not occur  Comprehension Comprehension Mode: Auditory Comprehension: 5-Understands basic 90% of the time/requires cueing < 10% of the time  Expression Expression Mode: Verbal Expression: 6-Expresses complex ideas: With extra time/assistive device  Social Interaction Social Interaction: 5-Interacts appropriately 90% of the time - Needs monitoring or encouragement for  participation or interaction.  Problem Solving Problem Solving: 4-Solves basic 75 - 89% of the time/requires cueing 10 - 24% of the time  Memory Memory: 4-Recognizes or recalls 75 - 89% of the time/requires cueing 10 - 24% of the time Medical Problem List and Plan:  1. Functional deficits secondary to T10 fracture with complete paraplegia after a fall. Status post T8-T12 fusion laminectomy decompression  2. DVT Prophylaxis/Anticoagulation:   lovenox 40q12. Dopplers neg 3. Pain Management: Hydrocodone and Robaxin as needed.  -fentanyl patch   4. Mood/anxiety: Xanax as needed. Provide emotional support   - neuropsych  5. Neuropsych: This patient is capable of making decisions on her own behalf.  6. Skin/Wound Care: Routine skin checks of surgical site and pressure areas  7. Hypertension/atrial fibrillation. Presently on Cardizem 60 mg 4 times a day. Cardiac rate control. A chest pain or shortness of breath.  8. Diabetes mellitus with peripheral neuropathy. Hemoglobin A1c 7.9. Presently with sliding scale insulin. Check blood sugars a.c. and at bedtime/  -sugars better  -continue glucophage  500mg  bid.    -scheduled lantus   18u qhs    -continue CM diet  9. Neurogenic bladder:  foley for now---consider voiding trial but given severity of injury, her prognosis for return is poor.  10. Neuro bowel:   -getting closer to an AM schedule  -am supp       11. Low grade temp---IS, check urine     LOS (Days) 16 A FACE TO FACE EVALUATION WAS PERFORMED  Rozanne Heumann T 03/11/2014 8:26 AM

## 2014-03-11 NOTE — Progress Notes (Signed)
Occupational Therapy Weekly Progress Note  Patient Details  Name: Yolanda Solis MRN: [id number] Date of Birth: Oct 20, 1933  Beginning of progress report period: March 04, 2014 End of progress report period: March 11, 2014  Today's Date: 03/11/2014 OT Individual Time: 9357-0177 OT Individual Time Calculation (min): 60 min    Patient has met 4 of 5 short term goals.  Pt continues to progress with goal of providing direction to caregivers for pressure relief but she continues to require reminders to initiate contact when services are not provided when needed. Pt is approx 70% reliable to provide direction when needed.  Patient continues to demonstrate the following deficits: Impaired dynamic sitting balance, LE weakness, and impaired short-term memory and therefore will continue to benefit from skilled OT intervention to enhance overall performance with Reduce care partner burden.  Patient progressing toward long term goals..  Continue plan of care.  OT Short Term Goals Week 2:  OT Short Term Goal 1 (Week 2): Pt will complete bed rolls with mod assist to manage legs during assisted BADL OT Short Term Goal 1 - Progress (Week 2): Met OT Short Term Goal 2 (Week 2): Pt will incorporate use of appropriate AE to improve participation in lower body BADL OT Short Term Goal 2 - Progress (Week 2): Progressing toward goal OT Short Term Goal 3 (Week 2): Pt will demonstrate ability to direct caregivers to assist in pressure relief while maintaining back precautions 100% of the time OT Short Term Goal 3 - Progress (Week 2): Met OT Short Term Goal 4 (Week 2): Pt will retreive beverage or food item from refrigerator at w/c level using AE, prn OT Short Term Goal 4 - Progress (Week 2): Met OT Short Term Goal 5 (Week 2): Pt will demo ability to perform HEP for 30 minutes to mprove endurance and UE strengthening with supervision OT Short Term Goal 5 - Progress (Week 2): Met Week 3:  OT Short Term  Goal 1 (Week 3): STG=LTG due to anticipated discharge to SNF for continued rehab/care.  Skilled Therapeutic Interventions/Progress Updates: ADL-retraining at bed level, supine, with focus on improved proficiency with bed mobility and improved direction of care (directing "setup" for BADL).   Pt now consistently min assist with bed mobility to bend both knees and to cue to log roll to prevent trunk rotation.   Pt bathes upper body with setup assist but requires intermittent instructional cues to attend to peri-area and stomach after she washes her chest and arms.  Pt participates with lower body bathing using LH sponge and requires total assist for lower body dressing but is now attempting to pull up pants over her hips during required bed mobility.   TLSO donned with setup assist and pt was then transferred to w/c using mech lift and was left in w/c with setup for breakfast meal.      Therapy Documentation Precautions:  Precautions Precautions: Fall;Back Required Braces or Orthoses: Spinal Brace Spinal Brace: Thoracolumbosacral orthotic;Applied in supine position Restrictions Weight Bearing Restrictions: No  Vital Signs: Therapy Vitals Temp: 99.1 F (37.3 C) Temp src: Oral Pulse Rate: 69 Resp: 18 BP: 126/55 mmHg Patient Position (if appropriate): Lying Oxygen Therapy SpO2: 98 % O2 Device: None (Room air)  Pain: Pain Assessment Pain Assessment: 0-10 Pain Score: 1  Pain Type: Surgical pain Pain Location: Back Pain Orientation: Mid;Lower Pain Descriptors / Indicators: Dull Pain Onset: On-going Pain Intervention(s): Rest;Repositioned Multiple Pain Sites: No  ADL: ADL ADL Comments: see FIM  See FIM  for current functional status  Therapy/Group: Individual Therapy  Second session: Time: 1400-1430 Time Calculation (min):  30 min  Pain Assessment: No/denies pain  Skilled Therapeutic Interventions: (15 min) ADL-retraining with emphasis on improved direction of care while  completing hair care supine in bed using shampoo cap.   Pt requested shampoo cap for continued ADL due to insufficient time to address need for routine hair care during morning ADL session.   Pt required only setup assist to complete task; shampoo cap and comb placed within pt's reach.  (15 min) Therapeutic exercise with focus on improved UE strength using theraband.   Pt performed 5 exercises for shoulder strengthening from HEP provided with setup assist to secure theraband to bedrails.   Pt pushed herself to complete 11-12 reps for each exercise with only intermittent vc for technique after brief re-ed on optimal position of grasp.   See FIM for current functional status  Therapy/Group: Individual Therapy  [place] 03/11/2014, 2:53 PM

## 2014-03-12 ENCOUNTER — Inpatient Hospital Stay (HOSPITAL_COMMUNITY): Payer: Medicare Other | Admitting: *Deleted

## 2014-03-12 ENCOUNTER — Inpatient Hospital Stay (HOSPITAL_COMMUNITY): Payer: Medicare Other | Admitting: Physical Therapy

## 2014-03-12 DIAGNOSIS — E1165 Type 2 diabetes mellitus with hyperglycemia: Secondary | ICD-10-CM

## 2014-03-12 DIAGNOSIS — IMO0002 Reserved for concepts with insufficient information to code with codable children: Secondary | ICD-10-CM

## 2014-03-12 DIAGNOSIS — IMO0001 Reserved for inherently not codable concepts without codable children: Secondary | ICD-10-CM

## 2014-03-12 DIAGNOSIS — W19XXXA Unspecified fall, initial encounter: Secondary | ICD-10-CM

## 2014-03-12 DIAGNOSIS — Z5189 Encounter for other specified aftercare: Secondary | ICD-10-CM

## 2014-03-12 LAB — GLUCOSE, CAPILLARY
GLUCOSE-CAPILLARY: 188 mg/dL — AB (ref 70–99)
GLUCOSE-CAPILLARY: 155 mg/dL — AB (ref 70–99)
GLUCOSE-CAPILLARY: 151 mg/dL — AB (ref 70–99)
Glucose-Capillary: 147 mg/dL — ABNORMAL HIGH (ref 70–99)

## 2014-03-12 MED ORDER — CEPHALEXIN 250 MG PO CAPS
250.0000 mg | ORAL_CAPSULE | Freq: Three times a day (TID) | ORAL | Status: DC
  Administered 2014-03-12 – 2014-03-14 (×8): 250 mg via ORAL
  Filled 2014-03-12 (×10): qty 1

## 2014-03-12 NOTE — Progress Notes (Signed)
Occupational Therapy Session Note  Patient Details  Name: Yolanda Solis MRN: [id number] Date of Birth: [birth year]  Today's Date: 03/12/2014 OT Individual Time:  -   1600-1630  (30 min)  Short Term Goals: Week 1:  OT Short Term Goal 1 (Week 1): Pt will complete bed rolls with mod assist to manage legs during assisted BADL OT Short Term Goal 1 - Progress (Week 1): Progressing toward goal OT Short Term Goal 2 (Week 1): Pt will demo ability to wash upper body with min assist for thoroughness while supine in bed OT Short Term Goal 2 - Progress (Week 1): Met OT Short Term Goal 3 (Week 1): Pt will demo abiltiy to perform lateral leans for toilet hygiene with mod assist to manage legs OT Short Term Goal 3 - Progress (Week 1): Not progressing OT Short Term Goal 4 (Week 1): Pt will demo ability to don upper body clothing with HOB elevated and mod assist OT Short Term Goal 4 - Progress (Week 1): Met OT Short Term Goal 5 (Week 1): Pt will perform 4 of 5 grooming tasks at w/c level with setup OT Short Term Goal 5 - Progress (Week 1): Met Week 2:  OT Short Term Goal 1 (Week 2): Pt will complete bed rolls with mod assist to manage legs during assisted BADL OT Short Term Goal 1 - Progress (Week 2): Met OT Short Term Goal 2 (Week 2): Pt will incorporate use of appropriate AE to improve participation in lower body BADL OT Short Term Goal 2 - Progress (Week 2): Progressing toward goal OT Short Term Goal 3 (Week 2): Pt will demonstrate ability to direct caregivers to assist in pressure relief while maintaining back precautions 100% of the time OT Short Term Goal 3 - Progress (Week 2): Met OT Short Term Goal 4 (Week 2): Pt will retreive beverage or food item from refrigerator at w/c level using AE, prn OT Short Term Goal 4 - Progress (Week 2): Met OT Short Term Goal 5 (Week 2): Pt will demo ability to perform HEP for 30 minutes to mprove endurance and UE strengthening with supervision OT Short Term Goal 5 -  Progress (Week 2): Met Week 3:  OT Short Term Goal 1 (Week 3): STG=LTG due to anticipated discharge to SNF for continued rehab/care.  Skilled Therapeutic Interventions/Progress Updates:    Addressed bed mobility, sitting balance, transfers to [facility] with SLIding Board.  Had pt rolling from right to left with assistance with LE's with min assist.  Pt. Instructed OT on donning brace in supine and then sat on EOB with mod assistance.  Sat EOB in static posture for 5 minutes with close supervision.  Transferred to [place] with sliding board and total assist +2 (2nd person for safety and minimal assist.  Pt. Pleased with how well the transfer went and expressed so with family.  Left pt in wc with call bell in place and family in room.    Therapy Documentation Precautions:  Precautions Precautions: Fall;Back Required Braces or Orthoses: Spinal Brace Spinal Brace: Thoracolumbosacral orthotic;Applied in supine position Restrictions Weight Bearing Restrictions: No General:     Pain: Pain Assessment Pain Assessment: 0-10 Pain Score: 5  Pain Type: Surgical pain Pain Location: Back Pain Orientation: Lower Pain Descriptors / Indicators: Aching Pain Onset: On-going Patients Stated Pain Goal: 0 Pain Intervention(s): Medication (See eMAR) ADL: ADL ADL Comments: see FIM  See FIM for current functional status  Therapy/Group: Individual Therapy  Lisa Roca 03/12/2014,  6:31 PM

## 2014-03-12 NOTE — Progress Notes (Addendum)
[place] PHYSICAL MEDICINE & REHABILITATION     PROGRESS NOTE    Subjective/Complaints: Slept well. Pain under reasonable control. "getting stronger" A  review of systems has been performed and if not noted above is otherwise negative.   Objective: Vital Signs: Blood pressure 125/60, pulse 76, temperature 98.1 F (36.7 C), temperature source Oral, resp. rate 18, weight 92.987 kg (205 lb), SpO2 98.00%. No results found. No results found for this basename: WBC, HGB, HCT, PLT,  in the last 72 hours No results found for this basename: NA, K, CL, CO, GLUCOSE, BUN, CREATININE, CALCIUM,  in the last 72 hours CBG (last 3)   Recent Labs  03/11/14 1647 03/11/14 2027 03/12/14 0703  GLUCAP 169* 184* 188*    Wt Readings from Last 3 Encounters:  03/09/14 92.987 kg (205 lb)  02/17/14 94.7 kg (208 lb 12.4 oz)  02/17/14 94.7 kg (208 lb 12.4 oz)    Physical Exam:  Constitutional: She is oriented to person, place, and time. She appears well-developed.  HENT: oral mucosa pink/moist  Head: Normocephalic.  Eyes: EOM are normal.  Neck: Normal range of motion. Neck supple. No thyromegaly present.  Cardiovascular: Normal rate and regular rhythm. No murmurs  Respiratory: Effort normal and breath sounds normal. No respiratory distress. No wheezes  GI: Soft. Bowel sounds are normal. She exhibits less distension with improved bowel sounds. Abdomen still large. Back incision clean.   Neuro: alert and oriented. Cognitively appears appropriate  T12 sensory level---0/2 sensation below the level 0/5 strength LLE except 2- hip adduction.   Trace+ movement Right HF, KE, tr at right ankle. No resting tone.  UE motor 5/5, UE sensory normal. CN intact. Cognition appropriate  Psych: normal Skin: small stage 2 near sacrum/coccyx. 1cm x 0.20cm   Assessment/Plan: 1. Functional deficits secondary to T10 fx/dislocation with sensory complete SCI which require 3+ hours per day of interdisciplinary therapy in  a comprehensive inpatient rehab setting. Physiatrist is providing close team supervision and 24 hour management of active medical problems listed below. Physiatrist and rehab team continue to assess barriers to discharge/monitor patient progress toward functional and medical goals.     FIM: FIM - Bathing Bathing Steps Patient Completed: Chest;Right Arm;Left Arm;Abdomen;Front perineal area;Right upper leg;Left upper leg Bathing: 7: Complete Independence: No helper  FIM - Upper Body Dressing/Undressing Upper body dressing/undressing steps patient completed: Thread/unthread right sleeve of pullover shirt/dresss;Thread/unthread left sleeve of pullover shirt/dress;Put head through opening of pull over shirt/dress Upper body dressing/undressing: 4: Min-Patient completed 75 plus % of tasks FIM - Lower Body Dressing/Undressing Lower body dressing/undressing: 1: Total-Patient completed less than 25% of tasks  FIM - Toileting Toileting: 1: Two helpers  FIM - Air cabin crew Transfers: 0-Activity did not occur  FIM - Control and instrumentation engineer Devices: Arm rests;Sliding board;Orthosis Bed/Chair Transfer: 2: Bed > Chair or W/C: Max A (lift and lower assist);2: Chair or W/C > Bed: Max A (lift and lower assist)  FIM - Locomotion: Wheelchair Distance: 150 Locomotion: Wheelchair: 1: Total Assistance/staff pushes wheelchair (Pt<25%) FIM - Locomotion: Ambulation Locomotion: Ambulation: 0: Activity did not occur  Comprehension Comprehension Mode: Auditory Comprehension: 5-Understands basic 90% of the time/requires cueing < 10% of the time  Expression Expression Mode: Verbal Expression: 6-Expresses complex ideas: With extra time/assistive device  Social Interaction Social Interaction: 5-Interacts appropriately 90% of the time - Needs monitoring or encouragement for participation or interaction.  Problem Solving Problem Solving: 4-Solves basic 75 - 89% of the  time/requires cueing 10 -  24% of the time  Memory Memory: 4-Recognizes or recalls 75 - 89% of the time/requires cueing 10 - 24% of the time Medical Problem List and Plan:  1. Functional deficits secondary to T10 fracture with complete paraplegia after a fall. Status post T8-T12 fusion laminectomy decompression  2. DVT Prophylaxis/Anticoagulation:   lovenox 40q12. Dopplers neg 3. Pain Management: Hydrocodone and Robaxin as needed.  -fentanyl patch   4. Mood/anxiety: Xanax as needed. Provide emotional support   - neuropsych  5. Neuropsych: This patient is capable of making decisions on her own behalf.  6. Skin/Wound Care: Routine skin checks of surgical site and pressure areas  7. Hypertension/atrial fibrillation. Presently on Cardizem 60 mg 4 times a day. Cardiac rate control. A chest pain or shortness of breath.  8. Diabetes mellitus with peripheral neuropathy. Hemoglobin A1c 7.9. Presently with sliding scale insulin. Check blood sugars a.c. and at bedtime/  -sugars better  -continue glucophage  500mg  bid.    -scheduled lantus   18u qhs    -continue CM diet  9. Neurogenic bladder:  foley for now---consider voiding trial but given severity of injury, her prognosis for return is poor.  10. Neuro bowel:   -getting closer to an AM schedule  -am supp       11. Low grade temp---IS, check urine, +UA start keflex pending culture result     LOS (Days) 17 A FACE TO FACE EVALUATION WAS PERFORMED  Charlett Blake 03/12/2014 8:34 AM

## 2014-03-12 NOTE — Progress Notes (Addendum)
Physical Therapy Session Note  Patient Details  Name: Yolanda Solis MRN: [id number] Date of Birth: [birth year]  Today's Date: 03/12/2014 PT Individual Time: 1350-1410 PT Individual Time Calculation (min): 20 min  General: Missed Time: 10 minutes due to toileting.   Short Term Goals: Week 3:  PT Short Term Goal 1 (Week 3): Pt will be able to verbally instruct set up for sliding board transfer.  PT Short Term Goal 2 (Week 3): Pt will transfer w/c to/from mat req 1 person assist.  PT Short Term Goal 3 (Week 3): Pt will demonstrate ability to follow commands for trunk leaning during head-hips relationship PT Short Term Goal 4 (Week 3): Pt will complete supine to/from sit transfer req mod A.  PT Short Term Goal 5 (Week 3): Pt will self propel manual w/c up ramp req min A.   Skilled Therapeutic Interventions/Progress Updates:    Therapeutic Activity: PT and PT Tech arrived to work with pt and she reports she was incontinent and needed to be changed. Pt was found with HOB elevated and no TLSO brace on; PT educated pt that she must lie flat if she does not have her TLSO brace on and she verbalized understanding, but explained that it was the "nurses" who did it. PT told pt that she needs to tell them to stop raising the [facility] if she notices them trying to do it without her TLSO on.  PT instructs pt in log rolling R and L req mod A with bedrail, PT crosses ankles, in order to change pt's brief and incontinence pad and place pt on the bedpan, as she was continuing to have a BM while being changed by PT.  Visitors arrived after pt was placed on bedpan and covered with a sheet. All 4 rails up and bed lowered. RN notified that pt was on bedpan. Cont per PT POC.   Therapy Documentation Precautions:  Precautions Precautions: Fall;Back Required Braces or Orthoses: Spinal Brace Spinal Brace: Thoracolumbosacral orthotic;Applied in supine position Restrictions Weight Bearing Restrictions: No Pain: Pain  Assessment Pain Assessment: 0-10 Pain Score: 2  Pain Type: Surgical pain Pain Location: Back Pain Orientation: Lower;Medial Pain Descriptors / Indicators: Aching Pain Onset: On-going Pain Intervention(s): Rest;Repositioned Multiple Pain Sites: No  See FIM for current functional status  Therapy/Group: Individual Therapy with Claris Che PT Tech as +2  [facility] M 03/12/2014, 1:55 PM

## 2014-03-13 ENCOUNTER — Inpatient Hospital Stay (HOSPITAL_COMMUNITY): Payer: Medicare Other | Admitting: *Deleted

## 2014-03-13 LAB — URINE CULTURE: Colony Count: >=100000

## 2014-03-13 LAB — GLUCOSE, CAPILLARY
Glucose-Capillary: 163 mg/dL — ABNORMAL HIGH (ref 70–99)
Glucose-Capillary: 125 mg/dL — ABNORMAL HIGH (ref 70–99)
Glucose-Capillary: 190 mg/dL — ABNORMAL HIGH (ref 70–99)
Glucose-Capillary: 135 mg/dL — ABNORMAL HIGH (ref 70–99)

## 2014-03-13 NOTE — Progress Notes (Signed)
[place] PHYSICAL MEDICINE & REHABILITATION     PROGRESS NOTE    Subjective/Complaints: Can't move my legs My back hurts when I am up a while A  review of systems has been performed and if not noted above is otherwise negative.   Objective: Vital Signs: Blood pressure 135/50, pulse 66, temperature 98.5 F (36.9 C), temperature source Oral, resp. rate 18, weight 92.987 kg (205 lb), SpO2 95.00%. No results found. No results found for this basename: WBC, HGB, HCT, PLT,  in the last 72 hours No results found for this basename: NA, K, CL, CO, GLUCOSE, BUN, CREATININE, CALCIUM,  in the last 72 hours CBG (last 3)   Recent Labs  03/12/14 1630 03/12/14 2124 03/13/14 0653  GLUCAP 155* 151* 163*    Wt Readings from Last 3 Encounters:  03/09/14 92.987 kg (205 lb)  02/17/14 94.7 kg (208 lb 12.4 oz)  02/17/14 94.7 kg (208 lb 12.4 oz)    Physical Exam:  Constitutional: She is oriented to person, place, and time. She appears well-developed.  HENT: oral mucosa pink/moist  Head: Normocephalic.  Eyes: EOM are normal.  Neck: Normal range of motion. Neck supple. No thyromegaly present.  Cardiovascular: Normal rate and regular rhythm. No murmurs  Respiratory: Effort normal and breath sounds normal. No respiratory distress. No wheezes  GI: Soft. Bowel sounds are normal. She exhibits less distension with improved bowel sounds. Abdomen still large. Back incision clean.   Neuro: alert and oriented. Cognitively appears appropriate  T12 sensory level---0/2 sensation below the level 0/5 strength LLE except 2- right hip adduction.   Trace+ movement Right HF, KE, tr at right ankle. No resting tone.  UE motor 5/5, UE sensory normal. CN intact. Cognition appropriate  Psych: normal Skin: small stage 2 near sacrum/coccyx. 1cm x 0.20cm   Assessment/Plan: 1. Functional deficits secondary to T10 fx/dislocation with sensory complete SCI which require 3+ hours per day of interdisciplinary therapy in a  comprehensive inpatient rehab setting. Physiatrist is providing close team supervision and 24 hour management of active medical problems listed below. Physiatrist and rehab team continue to assess barriers to discharge/monitor patient progress toward functional and medical goals.     FIM: FIM - Bathing Bathing Steps Patient Completed: Chest;Right Arm;Left Arm;Abdomen;Front perineal area;Right upper leg;Left upper leg Bathing: 7: Complete Independence: No helper  FIM - Upper Body Dressing/Undressing Upper body dressing/undressing steps patient completed: Thread/unthread right sleeve of pullover shirt/dresss;Thread/unthread left sleeve of pullover shirt/dress;Put head through opening of pull over shirt/dress Upper body dressing/undressing: 4: Min-Patient completed 75 plus % of tasks FIM - Lower Body Dressing/Undressing Lower body dressing/undressing: 1: Total-Patient completed less than 25% of tasks  FIM - Toileting Toileting: 0: No continent bowel/bladder events this shift  FIM - Air cabin crew Transfers: 0-Activity did not occur  FIM - Control and instrumentation engineer Devices: Arm rests;Sliding board;Orthosis Bed/Chair Transfer: 0: Activity did not occur  FIM - Locomotion: Wheelchair Distance: 150 Locomotion: Wheelchair: 0: Activity did not occur FIM - Locomotion: Ambulation Locomotion: Ambulation: 0: Activity did not occur  Comprehension Comprehension Mode: Auditory Comprehension: 5-Understands basic 90% of the time/requires cueing < 10% of the time  Expression Expression Mode: Verbal Expression: 5-Expresses basic needs/ideas: With no assist  Social Interaction Social Interaction: 6-Interacts appropriately with others with medication or extra time (anti-anxiety, antidepressant).  Problem Solving Problem Solving: 4-Solves basic 75 - 89% of the time/requires cueing 10 - 24% of the time  Memory Memory: 4-Recognizes or recalls 75 - 89% of the  time/requires cueing 10 - 24% of the time Medical Problem List and Plan:  1. Functional deficits secondary to T10 fracture with complete paraplegia after a fall. Status post T8-T12 fusion laminectomy decompression  2. DVT Prophylaxis/Anticoagulation:   lovenox 40q12. Dopplers neg 3. Pain Management: Hydrocodone and Robaxin as needed.  -fentanyl patch   4. Mood/anxiety: Xanax as needed. Provide emotional support   - neuropsych  5. Neuropsych: This patient is capable of making decisions on her own behalf.  6. Skin/Wound Care: Routine skin checks of surgical site and pressure areas  7. Hypertension/atrial fibrillation. Presently on Cardizem 60 mg 4 times a day. Cardiac rate control. A chest pain or shortness of breath.  8. Diabetes mellitus with peripheral neuropathy. Hemoglobin A1c 7.9. Presently with sliding scale insulin. Check blood sugars a.c. and at bedtime/  -sugars better  -continue glucophage  500mg  bid.    -scheduled lantus   18u qhs    -continue CM diet  9. Neurogenic bladder:  foley for now---consider voiding trial but given severity of injury, her prognosis for return is poor.  10. Neuro bowel:   -getting closer to an AM schedule  -am supp       11. Low grade temp---IS, check urine, +UA start keflex pending culture result, >100K ecoli thus far     LOS (Days) 18 A FACE TO FACE EVALUATION WAS PERFORMED  Yolanda Solis E 03/13/2014 8:04 AM

## 2014-03-13 NOTE — Progress Notes (Signed)
Occupational Therapy Session Note  Patient Details  Name: Yolanda Solis MRN: [id number] Date of Birth: 1934/01/09  Today's Date: 03/13/2014 OT Individual Time:  -   1330-1430   (60 min)   Short Term Goals: Week 1:  OT Short Term Goal 1 (Week 1): Pt will complete bed rolls with mod assist to manage legs during assisted BADL OT Short Term Goal 1 - Progress (Week 1): Progressing toward goal OT Short Term Goal 2 (Week 1): Pt will demo ability to wash upper body with min assist for thoroughness while supine in bed OT Short Term Goal 2 - Progress (Week 1): Met OT Short Term Goal 3 (Week 1): Pt will demo abiltiy to perform lateral leans for toilet hygiene with mod assist to manage legs OT Short Term Goal 3 - Progress (Week 1): Not progressing OT Short Term Goal 4 (Week 1): Pt will demo ability to don upper body clothing with HOB elevated and mod assist OT Short Term Goal 4 - Progress (Week 1): Met OT Short Term Goal 5 (Week 1): Pt will perform 4 of 5 grooming tasks at w/c level with setup OT Short Term Goal 5 - Progress (Week 1): Met Week 2:  OT Short Term Goal 1 (Week 2): Pt will complete bed rolls with mod assist to manage legs during assisted BADL OT Short Term Goal 1 - Progress (Week 2): Met OT Short Term Goal 2 (Week 2): Pt will incorporate use of appropriate AE to improve participation in lower body BADL OT Short Term Goal 2 - Progress (Week 2): Progressing toward goal OT Short Term Goal 3 (Week 2): Pt will demonstrate ability to direct caregivers to assist in pressure relief while maintaining back precautions 100% of the time OT Short Term Goal 3 - Progress (Week 2): Met OT Short Term Goal 4 (Week 2): Pt will retreive beverage or food item from refrigerator at w/c level using AE, prn OT Short Term Goal 4 - Progress (Week 2): Met OT Short Term Goal 5 (Week 2): Pt will demo ability to perform HEP for 30 minutes to mprove endurance and UE strengthening with supervision OT Short Term Goal  5 - Progress (Week 2): Met Week 3:  OT Short Term Goal 1 (Week 3): STG=LTG due to anticipated discharge to SNF for continued rehab/care.  Skilled Therapeutic Interventions/Progress Updates:    Addressed sliding board transfers, scooting forward and backward, sitting balance, and protective responses.  Pt. Lying in bed upon OT arrival.  She rolled in bed for bowel clean up with min to mod assist.  Pt needed increased help with multiple repetitions.  Donned pants with total assist.   Pt. Verbalized correct brace application.  Went from supine>sidelying>sitting EOB with max assist.  Transferred>wc with SB and total assist +2, Pt did 10 %.  Went from [employer] with sliding board with same level of assist.  Addressed sitting balance and pt exhibiting protective responses if falling or LOB.  Transferred back to wc with SB.  Pt. Had some nausea at end of session with movement activities but ok when back in room.   Pt. Taken back to room and left in wc with all needs in reach.    Therapy Documentation Precautions:  Precautions Precautions: Fall;Back Required Braces or Orthoses: Spinal Brace Spinal Brace: Thoracolumbosacral orthotic;Applied in supine position Restrictions Weight Bearing Restrictions: No   Pain:   none   ADL: ADL ADL Comments: see FIM    See FIM for current functional status  Therapy/Group: Individual Therapy  Lisa Roca 03/13/2014, 6:07 PM

## 2014-03-14 ENCOUNTER — Inpatient Hospital Stay (HOSPITAL_COMMUNITY): Payer: Medicare Other

## 2014-03-14 LAB — GLUCOSE, CAPILLARY: Glucose-Capillary: 165 mg/dL — ABNORMAL HIGH (ref 70–99)

## 2014-03-14 NOTE — Discharge Summary (Signed)
NAMEMarland Kitchen  Solis, Yolanda NO.:  [id number]  MEDICAL RECORD NO.:  [id number]  LOCATION:  [id number]                        FACILITY:  [facility]  PHYSICIAN:  Lauraine Rinne, P.A.  DATE OF BIRTH:  1934/05/02  DATE OF ADMISSION:  02/23/2014 DATE OF DISCHARGE:  03/14/2014                              DISCHARGE SUMMARY   DISCHARGE DIAGNOSES: 1. Functional deficits secondary to thoracic T10 fracture with     complete paraplegia after a fall, status post fusion. 2. Subcutaneous Lovenox for DVT prophylaxis. 3. Pain management. 4. Anxiety. 5. Hypertension with atrial fibrillation. 6. Diabetes mellitus, peripheral neuropathy. 7. Neurogenic bladder. 8. Neurogenic bowel. 9. E. coli urinary tract infection.  HISTORY OF PRESENT ILLNESS:  This is a 80 year old right-handed female, independent prior to admission living alone.  She does not drive. Admitted February 18, 2014, after unwitnessed fall at home, complaining of weakness to bilateral lower extremities.  She denied loss of consciousness.  X-rays and imaging revealed thoracic fracture T10 with dislocation, chance fracture with mild cord compression as well as cord edema.  Underwent thoracolumbar repair T8-T12,  fusion laminectomy, decompression of spinal cord, and posterolateral arthrodesis on February 18, 2014, per Dr. Vertell Limber.  HOSPITAL COURSE:  EKG, new onset atrial fibrillation, maintained on Cardizem.  Back brace when out of bed.  Subcutaneous heparin for DVT prophylaxis.  Physical and occupational therapy ongoing.  The patient was admitted for comprehensive rehab program.  PAST MEDICAL HISTORY:  See discharge diagnoses.  SOCIAL HISTORY:  Lives alone.  FUNCTIONAL HISTORY:  Prior to admission independent.  Functional status upon admission to rehab services was +2 physical assist, total assist for side-lying, total assist activities of daily living.  PHYSICAL EXAMINATION:  VITAL SIGNS:  Blood pressure 125/54, pulse  86, temperature 98.9, respirations 18.  GENERAL:  This was an alert female, in no acute distress, oriented x3.  HEENT:  Pupils were round and reactive to light.  LUNGS:  Clear to auscultation.  CARDIAC:  Regular rate and rhythm.  ABDOMEN:  Soft, nontender.  Good bowel sounds.  SKIN: Back incision healing nicely.  Back brace in place.  REHABILITATION HOSPITAL COURSE:  The patient was admitted to inpatient rehab services with therapies initiated on a 3-hour daily basis consisting of physical therapy, occupational therapy, and rehabilitation nursing.  The following issues were addressed during the patient's rehabilitation stay.  Pertaining to Yolanda Solis's T10 fracture with complete paraplegia after a fall, had undergone T8-T12 fusion laminectomy decompression per neurosurgery Dr. Vertell Limber.  Back brace when out of bed.  Surgical site healing nicely.  Subcutaneous Lovenox for DVT prophylaxis.  Venous Doppler studies negative.  Pain management with the use of a Duragesic patch as well as hydrocodone and Robaxin for breakthrough pain and spasms with good results.  She was using Xanax as needed for bouts of anxiety with emotional support provided.  New onset atrial fibrillation, cardiac rate controlled with Cardizem.  No chest pain or shortness of breath.  She did have a history of diabetes mellitus, peripheral neuropathy.  Hemoglobin A1c 7.9.  She remained on Glucophage as well as insulin with Lantus therapy.  Neurogenic bowel and bladder, Foley catheter tube was in place.  Neurogenic bowel  with a.m. suppository daily.  E. coli urinary tract infection, completing a 7-day course of Keflex.  The patient received weekly collaborative interdisciplinary team conferences to discuss estimated length of stay, family teaching, and any barriers to discharge.  Areas addressed; bed mobility, sitting balance, transfers.  The patient had a rolling from right to left with assistance in lower extremities a  minimal assistance.  Donning brace in supine and then sat at edge of bed with moderate assistance.  The patient instructed on log rolling right and left, requiring moderate assist with bed rails. The patient on limited advances with her paraplegia. skilled nursing facility needed. If bed becoming available March 15, 2014, discharge taking place at that time.  DISCHARGE MEDICATIONS:  Included Xanax 0.25 mg p.o. t.i.d. as needed. Keflex 250 mg p.o. every 8 hours x4 more days and stop, Cardizem 60 mg p.o. every 6 hours, Fentanyl patch 12.5 mcg change every 72 hours, Lantus insulin 18 units subcutaneously at bedtime, Claritin 10 mg p.o. daily, Glucophage 500 mg p.o. b.i.d., Robaxin 500 mg p.o. every 6 hours as needed muscle spasms, Protonix 40 mg p.o. daily. Florastor 250 mg p.o. b.i.d..  DIET:  Diabetic diet.  SPECIAL INSTRUCTIONS:  Back brace when out of bed, applied in supine position.  The patient would follow up Dr. Alger Simons at the outpatient rehab service office as directed; Dr. Erline Levine of Neurosurgery in 2 weeks, call for appointment .     Lauraine Rinne, P.A.     DA/MEDQ  D:  03/14/2014  T:  03/14/2014  Job:  [id number]  cc:   Marchia Meiers. Vertell Limber, M.D. Leonides Grills, M.D.

## 2014-03-14 NOTE — Progress Notes (Signed)
Report called to [facility].  All pertinent info given to nurse, all additional questions answered.  Awaiting EMS for transfer.  Brita Romp, RN

## 2014-03-14 NOTE — Discharge Summary (Signed)
Discharge summary job [phone]

## 2014-03-14 NOTE — Plan of Care (Signed)
Problem: RH Balance Goal: LTG Patient will maintain dynamic sitting balance (PT) LTG: Patient will maintain dynamic sitting balance with assistance during mobility activities (PT)  Outcome: Not Met (add Reason) Pt requires hands on assist ranging from Min-Max during dynamic seated activities without back support  Problem: RH Bed Mobility Goal: LTG Patient will perform bed mobility with assist (PT) LTG: Patient will perform bed mobility with assistance, with/without cues (PT).  Outcome: Not Met (add Reason) Pt able to roll R/L w/ Min-ModA depending on setup. However, pt requires MaxA to move supine<>sit due to decreased strength in BLE and decreased trunk control.  Problem: RH Bed to Chair Transfers Goal: LTG Patient will perform bed/chair transfers w/assist (PT) LTG: Patient will perform bed/chair transfers with assistance, with/without cues (PT).  Outcome: Not Met (add Reason) Pt continues to require Max-TotalA +2 for sliding board transfers  Problem: RH Car Transfers Goal: LTG Patient will perform car transfers with assist (PT) LTG: Patient will perform car transfers with assistance (PT).  Outcome: Not Met (add Reason) Unable to assess, pt unsafe to attempt car transfer at this time.     

## 2014-03-14 NOTE — Progress Notes (Signed)
Patient discharged from rehab to [facility].  Left floor via stretcher, escorted by EMS staff.  All patient belongings sent with patient.  Report previously called to RN at [facility].  Brita Romp, RN

## 2014-03-14 NOTE — Progress Notes (Signed)
Social Work Discharge Note  The overall goal for the admission was met for:   Discharge location: No - plan changed for pt to go to SNF  Length of Stay: Yes - 19 days  Discharge activity level: No - pt requires maximum>total assistance with most tasks  Home/community participation: No - tx to SNF  Services provided included: MD, RD, PT, OT, RN, Pharmacy and [place]: Private Insurance: [place] Medicare Complete  Follow-up services arranged: Other: Pt will be receiving continued therapies at the SNF.  Comments (or additional information):  Pt chose to go to [facility] in [place] to continue her rehab prior to going to her son's home.  Patient/Family verbalized understanding of follow-up arrangements: Yes  Individual responsible for coordination of the follow-up plan: pt and her son, Jori Moll  Confirmed correct DME delivered: Trey Sailors 03/14/2014    Devonn Giampietro, Silvestre Mesi

## 2014-03-14 NOTE — Progress Notes (Signed)
Physical Therapy Discharge Summary  Patient Details  Name: Yolanda Solis MRN: [id number] Date of Birth: September 23, 1933  Today's Date: 03/14/2014 Session 1 PT Individual Time: 0900-1000 PT Individual Time Calculation (min): 60 min  Session 2 PT Individual Time: 4098-1191 PT Individual Time Calculation (min): 45 min    Patient has met 2 of 6 long term goals due to improved activity tolerance and functional use of  right upper extremity and left upper extremity.  Patient to discharge at a wheelchair level Total Assist for transfers, pressure relief. Pt able to roll in bed w/ Min-ModA. Pt discharging to SNF due to extensive equipment and family training needs. Pt demonstrated good motor return in RLE, limited in LLE. Despite strength gains in RLE, pt is unable to use RLE functionally due to body shape, decreased trunk control, arm length, obesity. At this point therapist recommends use of mechanical lift for safe transfers in/out of bed, and use of tilt-in-space wheelchair due to impaired ability to relieve pressure in standard wheelchair.  Reasons goals not met: Decreased motor return in BLE  Recommendation:  Patient will benefit from ongoing skilled PT services in skilled nursing facility setting to continue to advance safe functional mobility, address ongoing impairments in trunk control, strength, functional mobility, and minimize fall risk.  Equipment: No equipment provided, equipment will be provided by accepting facility  Reasons for discharge: discharge from hospital  Patient/family agrees with progress made and goals achieved: Yes  Skilled Therapeutic Treatment: Session 1: Session focused on re-evaluation, functional mobility. Administered ASIA scale w/ pt. Demonstrated 3-/5 strength in R knee extensors, 2+/5 strength in R hip flexors, dorsiflexors, plantarflexors, and long toe extensors, 1/5 strength in L hip flexors, knee extensors, dorsiflexors, plantarflexors, 0/5 strength in L long  toe extensors. Pt w/ absent sensation to light touch in entire LLE, intact to light touch in RLE in L1-L3 dermatomes, absent below L3 in RLE. Sliding board transfer w/c to bed to L w/ MaxA x1, with +2A just to steady sliding board. Pt left supine in bed w/ all needs within reach.  Session 2: Session focused on bed mobility, pt directing care. Pt rolled L/R w/ setup assist (therapist bending knee) to achieve 75% of roll, then Min-ModA to finish roll. Pt able to direct therapist to place sling w/ min cueing. Extensive education on pressure relief, advocating for care at SNF. Pt left seated in w/c w/ all needs within reach.  PT Discharge Precautions/Restrictions Precautions Precautions: Fall;Back Required Braces or Orthoses: Spinal Brace Spinal Brace: Thoracolumbosacral orthotic;Applied in supine position Restrictions Weight Bearing Restrictions: No Vital Signs   Pain Pain Assessment Pain Assessment: 0-10 Pain Score: 1  Pain Type: Surgical pain Pain Location: Back Pain Orientation: Lower Pain Descriptors / Indicators: Aching Pain Frequency: Intermittent Pain Onset: On-going Patients Stated Pain Goal: 0 Pain Intervention(s): Medication (See eMAR) (premedicated prior to therapy) Vision/Perception     Cognition Overall Cognitive Status: Within Functional Limits for tasks assessed Arousal/Alertness: Awake/alert Orientation Level: Oriented X4 Sensation Sensation Light Touch: Impaired Detail Light Touch Impaired Details: Absent LLE;Impaired RLE (Light touch present in L1-L3 dermatomes, absent below L3 level) Proprioception: Impaired Detail Proprioception Impaired Details: Absent RLE;Impaired LLE Additional Comments: able to detect dorsiflexion and knee flexion in LLE, inconsistent reports and no detection of other directions Motor  Motor Motor: Paraplegia Motor - Discharge Observations: Motor return in RLE>LLE  Mobility Transfers Transfers: Yes Lateral/Scoot Transfer Details  (indicate cue type and reason): Pt able to verbalize setup for sliding board transfer w/ mod  cueing, required MaxA per PT note Transfer via Lift Equipment: Automotive engineer Ambulation: No Gait Gait: No Architect: No Wheelchair Assistance:  (Pt was S for w/c propulsion in standard w/c, switched to TIS w/c 2/2 developing pressure ulcers)  Trunk/Postural Assessment  Cervical Assessment Cervical Assessment:  (WFL, but pt reports dizziness/nausea w/ head movements) Thoracic Assessment Thoracic Assessment: Exceptions to [facility] (Limited 2/2 TLSO brace and precautions) Lumbar Assessment Lumbar Assessment: Exceptions to [facility] (Limited 2/2 TLSO brace and precautions)  Balance   Extremity Assessment      RLE Assessment RLE Assessment: Exceptions to [facility] RLE Strength Right Hip Flexion: 2+/5 Right Knee Extension: 3-/5 Right Ankle Dorsiflexion: 2+/5 Right Ankle Plantar Flexion: 2+/5 LLE Assessment LLE Assessment: Exceptions to [facility] LLE Strength Left Hip Flexion: 1/5 Left Knee Extension: 1/5 Left Ankle Dorsiflexion: 1/5 Left Ankle Plantar Flexion: 0/5  See FIM for current functional status  [name] 03/14/2014, 11:00 AM

## 2014-03-14 NOTE — Progress Notes (Signed)
Occupational Therapy Session and Discharge Summary  Patient Details  Name: Yolanda Solis MRN: [id number] Date of Birth: 1933-12-12  Today's Date: 03/14/2014 OT Individual Time: 0728-0828 OT Individual Time Calculation (min): 60 min   Skilled Intervention: ADL-retraining with emphasis on patient re-ed regarding carry-over of skill with directing caregivers during assisted ADL, improved awareness of skin integrity and discharge planning.   Pt demonstrated competence with skill in directing although she remains passive frequently when opportunities are presented from RN techs and related staff requiring direction with sling, PRAFO, and orthotic.   Pt completed supine bathing/dressing at her current level with increased assist required this session due to family removing items in prep for transfer to SNF.   Pt is now consistently able to complete bed mobility, numerous times during session, with only min assist to bend her knees and place them closer together during log rolls.   Pt at times required hand guidance to locate bedrails but now demonstrates sufficient UE strength to pull and lift during bed mobility with minimal pain, intermittently.   Pt transferred from bed to tilt-in-space w/c with mech lift and left in w/c with setup for self-feeding.  Call light and phone placed within reach.   Patient has met 3 of 4 long term goals due to improved activity tolerance, ability to compensate for deficits and improved awareness.  Patient to discharge at overall Mod-Max Assist level with continued need for mech lift transfers.   Reasons goals not met: Toileting goals not met due to incontinence.   Dynamic sitting goals partially met for self-feeding and grooming at sink with limited reach d/t TLSO restricting movements.  Recommendation:  Patient will benefit from ongoing skilled OT services in skilled nursing facility setting to continue to advance functional skills in the area of BADL.  Equipment: No  equipment provided  Reasons for discharge: discharge from hospital  Patient/family agrees with progress made and goals achieved: Yes  OT Discharge Precautions/Restrictions  Precautions Precautions: Fall;Back Required Braces or Orthoses: Spinal Brace Spinal Brace: Thoracolumbosacral orthotic;Applied in supine position Restrictions Weight Bearing Restrictions: No  Pain Pain Assessment Pain Assessment: 0-10 Pain Score: 0-No pain Pain Type: Surgical pain Pain Location: Back Pain Orientation: Lower Pain Descriptors / Indicators: Aching Pain Frequency: Intermittent Pain Onset: On-going Patients Stated Pain Goal: 0 Pain Intervention(s): Medication (See eMAR) (premedicated prior to therapy)  ADL ADL ADL Comments: see FIM  Vision/Perception  Vision- History Baseline Vision/History: Wears glasses Wears Glasses: Reading only Patient Visual Report: No change from baseline;Nausea/blurring vision with head movement Vision- Assessment Vision Assessment?: No apparent visual deficits Perception Comments: WFL   Cognition Overall Cognitive Status: Within Functional Limits for tasks assessed Arousal/Alertness: Awake/alert Orientation Level: Oriented X4 Attention: Alternating Alternating Attention: Appears intact Memory: Appears intact Awareness: Appears intact Awareness Impairment: Anticipatory impairment Problem Solving: Appears intact Problem Solving Impairment: Verbal complex;Functional basic Safety/Judgment: Appears intact  Sensation Sensation Light Touch: Appears Intact Light Touch Impaired Details: Absent LLE;Impaired RLE (Light touch present in L1-L3 dermatomes, absent below L3 level) Stereognosis: Appears Intact Hot/Cold: Appears Intact Proprioception: Appears Intact Proprioception Impaired Details: Absent RLE;Impaired LLE Additional Comments: WFL @ BUE, grossly impaired BLE (see P.T. note) Coordination Gross Motor Movements are Fluid and Coordinated: Yes Fine  Motor Movements are Fluid and Coordinated: Yes  Motor  Motor Motor: Paraplegia Motor - Discharge Observations: Motor return in RLE>LLE  Mobility  Bed Mobility Bed Mobility: Rolling Right;Rolling Left Rolling Right: 4: Min assist Rolling Left: 4: Min assist Transfers Transfers: Not assessed (mech lift  transfers for safety)   Trunk/Postural Assessment  Cervical Assessment Cervical Assessment: Within Functional Limits Thoracic Assessment Thoracic Assessment: Exceptions to [facility] Lumbar Assessment Lumbar Assessment: Exceptions to [facility] Postural Control Postural Control: Deficits on evaluation   Extremity/Trunk Assessment RUE Assessment RUE Assessment: Within Functional Limits LUE Assessment LUE Assessment: Within Functional Limits  See FIM for current functional status  [place] 03/14/2014, 2:55 PM

## 2014-03-14 NOTE — Progress Notes (Signed)
[facility]ILITATION     PROGRESS NOTE    Subjective/Complaints: Can't move my legs My back hurts when I am up a while A  review of systems has been performed and if not noted above is otherwise negative.   Objective: Vital Signs: Blood pressure 124/49, pulse 94, temperature 98.3 F (36.8 C), temperature source Oral, resp. rate 18, weight 92.987 kg (205 lb), SpO2 97.00%. No results found. No results found for this basename: WBC, HGB, HCT, PLT,  in the last 72 hours No results found for this basename: NA, K, CL, CO, GLUCOSE, BUN, CREATININE, CALCIUM,  in the last 72 hours CBG (last 3)   Recent Labs  03/13/14 1706 03/13/14 2053 03/14/14 0724  GLUCAP 190* 135* 165*    Wt Readings from Last 3 Encounters:  03/09/14 92.987 kg (205 lb)  02/17/14 94.7 kg (208 lb 12.4 oz)  02/17/14 94.7 kg (208 lb 12.4 oz)    Physical Exam:  Constitutional: She is oriented to person, place, and time. She appears well-developed.  HENT: oral mucosa pink/moist  Head: Normocephalic.  Eyes: EOM are normal.  Neck: Normal range of motion. Neck supple. No thyromegaly present.  Cardiovascular: Normal rate and regular rhythm. No murmurs  Respiratory: Effort normal and breath sounds normal. No respiratory distress. No wheezes  GI: Soft. Bowel sounds are normal. She exhibits less distension with improved bowel sounds. Abdomen still large. Back incision clean.   Neuro: alert and oriented. Cognitively appears appropriate  T12 sensory level---0/2 sensation below the level 0/5 strength LLE except 2- right hip adduction, trace HF,KE.   1+ to 2- movement Right HF, 1+ KE, 1 at right ankle. No resting tone.  UE motor 5/5, UE sensory normal. CN intact. Cognition appropriate  Psych: normal Skin: small stage 2 near sacrum/coccyx.    Assessment/Plan: 1. Functional deficits secondary to T10 fx/dislocation with sensory complete SCI which require 3+ hours per day of interdisciplinary therapy  in a comprehensive inpatient rehab setting. Physiatrist is providing close team supervision and 24 hour management of active medical problems listed below. Physiatrist and rehab team continue to assess barriers to discharge/monitor patient progress toward functional and medical goals.     FIM: FIM - Bathing Bathing Steps Patient Completed: Chest;Right Arm;Left Arm;Abdomen;Front perineal area;Right upper leg;Left upper leg Bathing: 3: Mod-Patient completes 5-7 32f 10 parts or 50-74%  FIM - Upper Body Dressing/Undressing Upper body dressing/undressing steps patient completed: Thread/unthread right sleeve of pullover shirt/dresss;Thread/unthread left sleeve of pullover shirt/dress;Put head through opening of pull over shirt/dress Upper body dressing/undressing: 4: Min-Patient completed 75 plus % of tasks FIM - Lower Body Dressing/Undressing Lower body dressing/undressing: 1: Total-Patient completed less than 25% of tasks  FIM - Toileting Toileting: 0: No continent bowel/bladder events this shift  FIM - Air cabin crew Transfers: 0-Activity did not occur  FIM - Control and instrumentation engineer Devices: Arm rests;Sliding board;Orthosis Bed/Chair Transfer: 1: Mechanical lift  FIM - Locomotion: Wheelchair Distance: 150 Locomotion: Wheelchair: 0: Activity did not occur FIM - Locomotion: Ambulation Locomotion: Ambulation: 0: Activity did not occur  Comprehension Comprehension Mode: Auditory Comprehension: 5-Understands complex 90% of the time/Cues < 10% of the time  Expression Expression Mode: Verbal Expression: 5-Expresses complex 90% of the time/cues < 10% of the time  Social Interaction Social Interaction: 6-Interacts appropriately with others with medication or extra time (anti-anxiety, antidepressant).  Problem Solving Problem Solving: 5-Solves basic 90% of the time/requires cueing < 10% of the time  Memory Memory: 5-Recognizes or  recalls 90% of the  time/requires cueing < 10% of the time Medical Problem List and Plan:  1. Functional deficits secondary to T10 fracture with complete paraplegia after a fall. Status post T8-T12 fusion laminectomy decompression  2. DVT Prophylaxis/Anticoagulation:   lovenox 40q12. Dopplers neg 3. Pain Management: Hydrocodone and Robaxin as needed.  -fentanyl patch   4. Mood/anxiety: Xanax as needed. Provide emotional support   - neuropsych  5. Neuropsych: This patient is capable of making decisions on her own behalf.  6. Skin/Wound Care: Routine skin checks of surgical site and pressure areas  7. Hypertension/atrial fibrillation. Presently on Cardizem 60 mg 4 times a day. Cardiac rate control. A chest pain or shortness of breath.  8. Diabetes mellitus with peripheral neuropathy. Hemoglobin A1c 7.9. Presently with sliding scale insulin. Check blood sugars a.c. and at bedtime/  -sugars tolerable  -continue glucophage  500mg  bid.    -scheduled lantus   18u qhs    -continue CM diet  9. Neurogenic bladder:  foley for now---consider voiding trial but given severity of injury, her prognosis for return is poor.  10. Neuro bowel:   -getting closer to an AM schedule  -am supp       11. 100k E Coli- sensitive to keflex- continue for 7 days.    LOS (Days) 19 A FACE TO FACE EVALUATION WAS PERFORMED  Kelsy Polack T 03/14/2014 8:31 AM

## 2014-03-15 ENCOUNTER — Other Ambulatory Visit: Payer: Self-pay | Admitting: *Deleted

## 2014-03-15 LAB — GLUCOSE, CAPILLARY: GLUCOSE-CAPILLARY: 123 mg/dL — AB (ref 70–99)

## 2014-03-15 MED ORDER — ALPRAZOLAM 0.25 MG PO TABS: ORAL_TABLET | ORAL | Status: DC

## 2014-03-15 MED ORDER — HYDROCODONE-ACETAMINOPHEN 5-325 MG PO TABS: ORAL_TABLET | ORAL | Status: DC

## 2014-03-15 NOTE — Telephone Encounter (Signed)
[facility] 

## 2014-03-16 ENCOUNTER — Non-Acute Institutional Stay (SKILLED_NURSING_FACILITY): Payer: Medicare Other | Admitting: Internal Medicine

## 2014-03-16 DIAGNOSIS — I480 Paroxysmal atrial fibrillation: Secondary | ICD-10-CM

## 2014-03-16 DIAGNOSIS — I4891 Unspecified atrial fibrillation: Secondary | ICD-10-CM

## 2014-03-16 DIAGNOSIS — E1149 Type 2 diabetes mellitus with other diabetic neurological complication: Secondary | ICD-10-CM

## 2014-03-16 DIAGNOSIS — N319 Neuromuscular dysfunction of bladder, unspecified: Secondary | ICD-10-CM

## 2014-03-16 DIAGNOSIS — G114 Hereditary spastic paraplegia: Secondary | ICD-10-CM

## 2014-03-16 NOTE — Progress Notes (Signed)
Patient ID: Yolanda Solis, female   DOB: 1934/03/15, 80 y.o.   MRN: [id number]  Facility; [facility] Chief complaint; admission to SNF post admit to [facility] from 8/27 to 9/2. Since then has been a common inpatient rehabilitation from 9/2 through 9/21  History; this is a very pleasant 80 year old woman who lives on her own somewhere near this facility. It sounds as though she was reasonably independent although family members drove her to the grocery store. She had a cane for outside activities but other than at sounds as though she was fairly independent. She suffered a fall one day prior to admission. The patient was seen in the ER according to her and sent home the next day he she could not move her legs and had loss of sensation. She was transferred to [facility]. She was found to have a T10 fracture with mild cord compression and edema. She underwent urgent decompressive surgery by neurosurgery. She has been left with lower extremity paraplegia, chronic Foley catheter and neurogenic bowel. The patient states she has been making some recovery.   Past Medical History  Diagnosis Date  . Diabetes mellitus   . Hypertension   . Arthritis    Past Surgical History  Procedure Laterality Date  . Abdominal hysterectomy    . Posterior lumbar fusion 4 level N/A 02/17/2014    Procedure: Thoracolumbar Repair;  Surgeon: Erline Levine, MD;  Location: [place] [facility]UR[facility] ORS;  Service: Neurosurgery;  Laterality: N/A;  Thoracolumbar Repair T8 - T12   Medications; Xanax 0.25 by mouth 3 times a day Keflex 250 by mouth every 8 for 4 days then DC Cardizem 60 mg every 6 Fentanyl patch 12.5 every 72 hours Lantus insulin 18 units at bedtime Glucophage 500 by mouth twice a day Claritin 10 mg by mouth daily for allergic symptoms Robaxin 500 mg by mouth every 6 hours Prilosec 40 mg by mouth daily                          Social; Patient states she lives on her own. Occasional cane for outside  activities. She was independent with ADLs some help from family members for outside activities.  reports that she has never smoked. She does not have any smokeless tobacco history on file. She reports that she does not drink alcohol or use illicit drugs.  Review of systems  respiratory no complaints of shortness of breath or cough Cardiac; no complaints of chest pain or palpitations. She talks about having 2 syncopal spells one in 2008 and 1 no recent inactive. She states this was related to her "heart going to slow". She was diagnosed with new ache onset atrial Abdomen; it does not sound as though she has bowel sensation GU; Foley catheter in place for neurogenic bladder Skin; according to the wound care team and has a small wound on the posterior left leg just above the ankle I did not look at this today  Physical examination Gen. the patient is not in any distress bright and alert. Respiratory; clear entry bilaterally Cardiac heart sounds fairly regular, no murmurs and no carotid bruits Abdomen; somewhat obese no liver no spleen no tenderness. GU Foley catheter in place Neurologic; compatible with a lower thoracic sensory level although she apparently is having some improvement. She is able to have some hip flexor movement perhaps 1/5 for. She is able to move the toes on the right foot. She has lesser degrees of hip  flexion and foot movement on the left but she does have hip extension on the left  Impression/plan #1 status post traumatic head T10 fracture with spinal cord, at that level. [Chance fracture]. She has made some recovery hopefully this will continue to some form of functional level. She has neurogenic bladder and neurogenic bowel. ALT try to put her on some form of bowel program #2 type 2 diabetes her hemoglobin A1c was 7.9 on metformin. She is also on Lantus insulin 18 units at bedtime. Apparently with a history of peripheral neuropathy #3 new-onset atrial fibrillation which  occurred in the hospital. Maintain on diltiazem. She is not on any anticoagulation or antiplatelet drugs. Patient states she has had syncopal spells in the remote past. #4 hypertension #5 chronic pain/lower extremity spasms on a fentanyl patch. This makes very little sense to me she could probably tolerate an oral analgesic regimen. She is also on Robaxin when necessary  Patient will need aggressive physical therapy. I will start her on a bowel regimen. She will need to be put on aspirin. I am going to change her fentanyl to a long-acting analgesic regimen     Room:       4N10C   ADMITTING    Erline Levine [id number]  ATTENDING    Erline Levine [id number]  [place], Inpatient  cc:  ------------------------------------------------------------------- LV EF: 55% -   60%  ------------------------------------------------------------------- History:   PMH:  Fracture of thoracic spine, Acute BLE paraplegia. Assess Wall Motion and Valvular Disease.  Atrial fibrillation. Risk factors:  Hypertension. Diabetes mellitus.  ------------------------------------------------------------------- Study Conclusions  - Procedure narrative: Transthoracic echocardiography. Image   quality was adequate. The study was technically difficult, as a   result of restricted patient mobility. - Left ventricle: The cavity size was normal. Wall thickness was   increased in a pattern of mild LVH. Systolic function was normal.   The estimated ejection fraction was in the range of 55% to 60%.   There may be basal to mid inferior hypokinesis. The study is not   technically sufficient to allow evaluation of LV diastolic   function. - Aortic valve: Trileaflet. Sclerosis without stenosis. There was   no regurgitation. - Mitral valve: Mildly thickened leaflets . There was no   significant regurgitation. - Left atrium:  The atrium was normal in size. - Inferior vena cava: The vessel was dilated. The respirophasic   diameter changes were blunted (< 50%), consistent with elevated   central venous pressure. - Pericardium, extracardiac: There was no pericardial effusion.  Impressions:  - LVEF 55-60%, mild LVH, possible basal to mid inferior   hypokinesis, cannot assess diastolic function.  Transthoracic echocardiography.  M-mode, complete 2D, spectral Doppler, and color Doppler.  Birthdate:  Patient birthdate: 07-19-1933.  Age:  Patient is 80 yr old.  Sex:  Gender: female. BMI: 35.7 kg/m^2.  Blood pressure:     119/54  Patient status: Inpatient.  Study date:  Study date: 02/22/2014. Study time: 11:33 AM.  Location:  Bedside.  -------------------------------------------------------------------  ------------------------------------------------------------------- Left ventricle:  The cavity size was normal. Wall thickness was increased in a pattern of mild LVH. Systolic function was normal. The estimated ejection fraction was in the range of 55% to 60%. There may be basal to mid inferior hypokinesis. The study is not technically sufficient to allow evaluation of LV diastolic function.  -------------------------------------------------------------------  Aortic valve:   Trileaflet. Sclerosis without stenosis.  Doppler: There was no regurgitation.  ------------------------------------------------------------------- Aorta:  Aortic root: The aortic root was normal in size. Ascending aorta: The ascending aorta was normal in size.  ------------------------------------------------------------------- Mitral valve:   Mildly thickened leaflets .  Doppler:  There was no significant regurgitation.    Peak gradient (D): 3 mm Hg.  ------------------------------------------------------------------- Left atrium:  The atrium was normal in  size.  ------------------------------------------------------------------- Atrial septum:  No defect or patent foramen ovale was identified.   ------------------------------------------------------------------- Pulmonic valve:    The valve appears to be grossly normal. Doppler:  There was no significant regurgitation.  ------------------------------------------------------------------- Tricuspid valve:   Doppler:  There was no significant regurgitation.  ------------------------------------------------------------------- Pulmonary artery:   The main pulmonary artery was normal-sized.  ------------------------------------------------------------------- Right atrium:  The atrium was normal in size.  ------------------------------------------------------------------- Pericardium:  There was no pericardial effusion. Electronically Signed   By: Franchot Gallo M.D.   On: 02/17/2014 21:41       Study Result    CLINICAL DATA:  [place] yesterday.  Back pain.  Bilateral leg weakness   EXAM: MRI THORACIC SPINE WITHOUT CONTRAST   TECHNIQUE: Multiplanar, multisequence MR imaging of the thoracic spine was performed. No intravenous contrast was administered.   COMPARISON:  None.   FINDINGS: Displaced chance fracture of T10. Horizontal fracture through the T10 vertebral body with hematoma. There is anterior displacement of the upper half of T10 on the lower half by approximately 6 mm. Fracture extends into the pedicles bilaterally. There is probable epidural hematoma. There is cord compression and mild cord signal abnormality. This fracture is unstable.   No other fracture.  No disc protrusion identified.   IMPRESSION: Displaced chance fracture through the T10 vertebral body. There is spinal stenosis and mild cord compression and mild cord edema. This fracture is unstable.   Electronically Signed: By: Franchot Gallo M.D. On: 02/17/2014 21:31   Results for Yolanda Solis, Yolanda Solis (MRN  [id number]) as of 03/16/2014 14:08  Ref. Range 02/23/2014 03:38 02/23/2014 20:31 02/24/2014 08:43 02/28/2014 05:40 03/11/2014 10:44  Sodium Latest Range: 137-147 mEq/L   138 139   Potassium Latest Range: 3.7-5.3 mEq/L   4.4 5.3   Chloride Latest Range: 96-112 mEq/L   98 99   CO2 Latest Range: 19-32 mEq/L   25 28   BUN Latest Range: 6-23 mg/dL   26 (H) 11   Creatinine Latest Range: 0.50-1.10 mg/dL  1.13 (H) 0.90 0.93   Calcium Latest Range: 8.4-10.5 mg/dL   8.6 9.0   GFR calc non Af Amer Latest Range: >90 mL/min  46 (L) 60 (L) 58 (L)   GFR calc Af Amer Latest Range: >90 mL/min  53 (L) 70 (L) 67 (L)   Glucose Latest Range: 70-99 mg/dL   285 (H) 228 (H)   Anion gap Latest Range: 5-15    15 12    Alkaline Phosphatase Latest Range: 39-117 U/L   138 (H)    Albumin Latest Range: 3.5-5.2 g/dL   2.4 (L)    AST Latest Range: 0-37 U/L   23    ALT Latest Range: 0-35 U/L   7    Total Protein Latest Range: 6.0-8.3 g/dL   6.1    Total Bilirubin Latest Range: 0.3-1.2 mg/dL   0.5    WBC Latest Range: 4.0-10.5 K/uL 15.3 (H) 16.6 (H) 15.3 (H)    RBC Latest Range: 3.87-5.11 MIL/uL 3.86 (L) 3.89 3.95    Hemoglobin Latest Range: 12.0-15.0 g/dL  11.6 (L) 11.5 (L) 11.8 (L)    HCT Latest Range: 36.0-46.0 % 36.0 35.5 (L) 37.2    MCV Latest Range: 78.0-100.0 fL 93.3 91.3 94.2    MCH Latest Range: 26.0-34.0 pg 30.1 29.6 29.9    MCHC Latest Range: 30.0-36.0 g/dL 32.2 32.4 31.7    RDW Latest Range: 11.5-15.5 % 13.6 13.5 13.7    Platelets Latest Range: 150-400 K/uL 232 234 279    Neutrophils Relative % Latest Range: 43-77 %   75    Lymphocytes Relative Latest Range: 12-46 %   9 (L)    Monocytes Relative Latest Range: 3-12 %   10    Eosinophils Relative Latest Range: 0-5 %   5    Basophils Relative Latest Range: 0-1 %   1    NEUT# Latest Range: 1.7-7.7 K/uL   11.6 (H)    Lymphocytes Absolute Latest Range: 0.7-4.0 K/uL   1.4    Monocytes Absolute Latest Range: 0.1-1.0 K/uL   1.5 (H)    Eosinophils Absolute Latest Range:  0.0-0.7 K/uL   0.8 (H)    Basophils Absolute Latest Range: 0.0-0.1 K/uL   0.1    Color, Urine Latest Range: YELLOW      AMBER (A)  APPearance Latest Range: CLEAR      CLOUDY (A)  Specific Gravity, Urine Latest Range: 1.005-1.030      1.025  pH Latest Range: 5.0-8.0      5.5  Glucose Latest Range: NEGATIVE mg/dL     NEGATIVE  Bilirubin Urine Latest Range: NEGATIVE      SMALL (A)  Ketones, ur Latest Range: NEGATIVE mg/dL     15 (A)  Protein Latest Range: NEGATIVE mg/dL     30 (A)  Urobilinogen, UA Latest Range: 0.0-1.0 mg/dL     1.0  Nitrite Latest Range: NEGATIVE      POSITIVE (A)  Leukocytes, UA Latest Range: NEGATIVE      MODERATE (A)  Hgb urine dipstick Latest Range: NEGATIVE      SMALL (A)  WBC, UA Latest Range: <3 WBC/hpf     11-20  RBC / HPF Latest Range: <3 RBC/hpf     3-6  Bacteria, UA Latest Range: RARE      FEW (A)  Casts Latest Range: NEGATIVE      HYALINE CASTS (A)  Organism ID, Bacteria No range found     ESCHERICHIA COLI  URINE CULTURE No range found     Rpt

## 2014-03-30 ENCOUNTER — Non-Acute Institutional Stay (SKILLED_NURSING_FACILITY): Payer: Medicare Other | Admitting: Internal Medicine

## 2014-03-30 DIAGNOSIS — I480 Paroxysmal atrial fibrillation: Secondary | ICD-10-CM

## 2014-03-30 DIAGNOSIS — IMO0002 Reserved for concepts with insufficient information to code with codable children: Secondary | ICD-10-CM

## 2014-03-30 DIAGNOSIS — G8389 Other specified paralytic syndromes: Secondary | ICD-10-CM

## 2014-03-30 DIAGNOSIS — N319 Neuromuscular dysfunction of bladder, unspecified: Secondary | ICD-10-CM

## 2014-04-05 NOTE — Progress Notes (Signed)
Patient ID: Yolanda Solis, female   DOB: 04/09/1934, 80 y.o.   MRN: [id number]               PROGRESS NOTE  DATE:  03/30/2014    FACILITY: [facility]    LEVEL OF CARE:   SNF   Acute Visit/Discharge Visit        CHIEF COMPLAINT:  Pre-discharge review.      HISTORY OF PRESENT ILLNESS:  This is a patient who came into the building at the end of September from [facility] after an admission from 02/17/2014 through 02/23/2014 and then a stay at rehab from 02/23/2014 through 03/14/2014.    She is a previously independent woman who lives somewhere near this facility.  She went outside and suffered a fall the day before her admission.  She developed paraplegia with inability to move her legs and loss of sensation.   She was found to have a T10 fracture with cord compression and edema.  She underwent urgent decompressive surgery by Neurosurgery.  She has been left with lower extremity paralysis, a chronic Foley catheter, and neurogenic bowel.     She has also type 2 diabetes, on metformin and insulin.  I believe the insulin is new.    She has had a reasonably short stay here.  Apparently, discharge is being arranged for Monday, the 12th of October.  She is going home with her son.  He somehow has a hospital bed and a Hoyer lift that has been given by somebody who had these through the [place].  In any case, they have these at home.  They will need a wheelchair and a 3-in-1 commode.  Also, I am hearing that somebody is available to give insulin, who is a Industrial/product designer.    PHYSICAL EXAMINATION:   VITAL SIGNS:   O2 SATURATIONS:  Normal on room air.   GENERAL APPEARANCE:  The patient appears well.  She is talkative and alert.   CHEST/RESPIRATORY:  Exam is clear.   CARDIOVASCULAR:  CARDIAC:   Heart sounds are normal.    ASSESSMENT/PLAN:  Status post T10 fracture.  She has had some return on the right leg, not much on the left.  She will maintain a Foley catheter.    Type 2 diabetes.  On insulin.    Her hemoglobin A1c was 7.9.  She is on metformin and Lantus insulin.  Apparently, there is a neighbor who will be giving her her q.h.s. insulin.     New onset atrial fibrillation in hospital.  This is controlled on diltiazem.  She is currently on diltiazem 60 mg three times a day.  I will change this to Cardizem CD before she leaves, which I gather is on Friday.    Not on any anticoagulation or antiplatelet drugs.  I am going to give her aspirin.  For many reasons, I think this is the best choice.    CPT CODE: 43329 (greater than 30 minutes spent)        ADDENDUM:  I did change her earlier in this admission to MS Contin 15 mg b.i.d.  I will discharge her on this.  The Fentanyl patch was not necessary.

## 2014-04-12 DIAGNOSIS — I4891 Unspecified atrial fibrillation: Secondary | ICD-10-CM

## 2014-04-12 DIAGNOSIS — G8220 Paraplegia, unspecified: Secondary | ICD-10-CM

## 2014-04-12 DIAGNOSIS — E119 Type 2 diabetes mellitus without complications: Secondary | ICD-10-CM

## 2014-04-12 DIAGNOSIS — S24103S Unspecified injury at T7-T10 level of thoracic spinal cord, sequela: Secondary | ICD-10-CM

## 2014-04-28 ENCOUNTER — Other Ambulatory Visit (HOSPITAL_COMMUNITY): Payer: Self-pay | Admitting: Neurosurgery

## 2014-04-28 ENCOUNTER — Ambulatory Visit (HOSPITAL_COMMUNITY)
Admission: RE | Admit: 2014-04-28 | Discharge: 2014-04-28 | Disposition: A | Discharge status: Home / Self Care | Payer: Medicare Other | Source: Ambulatory Visit | Attending: Neurosurgery | Admitting: Neurosurgery

## 2014-04-28 DIAGNOSIS — S22079D Unspecified fracture of T9-T10 vertebra, subsequent encounter for fracture with routine healing: Secondary | ICD-10-CM | POA: Diagnosis not present

## 2014-06-29 DIAGNOSIS — N342 Other urethritis: Secondary | ICD-10-CM | POA: Diagnosis not present

## 2014-06-29 DIAGNOSIS — I10 Essential (primary) hypertension: Secondary | ICD-10-CM | POA: Diagnosis not present

## 2014-06-29 DIAGNOSIS — Z6835 Body mass index (BMI) 35.0-35.9, adult: Secondary | ICD-10-CM | POA: Diagnosis not present

## 2014-06-29 DIAGNOSIS — K219 Gastro-esophageal reflux disease without esophagitis: Secondary | ICD-10-CM | POA: Diagnosis not present

## 2014-06-29 DIAGNOSIS — E1165 Type 2 diabetes mellitus with hyperglycemia: Secondary | ICD-10-CM | POA: Diagnosis not present

## 2014-07-05 DIAGNOSIS — Z780 Asymptomatic menopausal state: Secondary | ICD-10-CM | POA: Diagnosis not present

## 2014-07-05 DIAGNOSIS — S22009A Unspecified fracture of unspecified thoracic vertebra, initial encounter for closed fracture: Secondary | ICD-10-CM | POA: Diagnosis not present

## 2014-07-05 DIAGNOSIS — Z13820 Encounter for screening for osteoporosis: Secondary | ICD-10-CM | POA: Diagnosis not present

## 2014-07-05 DIAGNOSIS — G8220 Paraplegia, unspecified: Secondary | ICD-10-CM | POA: Diagnosis not present

## 2014-07-13 DIAGNOSIS — S24103D Unspecified injury at T7-T10 level of thoracic spinal cord, subsequent encounter: Secondary | ICD-10-CM | POA: Diagnosis not present

## 2014-07-13 DIAGNOSIS — S22079D Unspecified fracture of T9-T10 vertebra, subsequent encounter for fracture with routine healing: Secondary | ICD-10-CM | POA: Diagnosis not present

## 2014-07-14 DIAGNOSIS — W1839XD Other fall on same level, subsequent encounter: Secondary | ICD-10-CM | POA: Diagnosis not present

## 2014-07-14 DIAGNOSIS — E119 Type 2 diabetes mellitus without complications: Secondary | ICD-10-CM | POA: Diagnosis not present

## 2014-07-14 DIAGNOSIS — Z466 Encounter for fitting and adjustment of urinary device: Secondary | ICD-10-CM | POA: Diagnosis not present

## 2014-07-14 DIAGNOSIS — S24103S Unspecified injury at T7-T10 level of thoracic spinal cord, sequela: Secondary | ICD-10-CM | POA: Diagnosis not present

## 2014-07-14 DIAGNOSIS — Z794 Long term (current) use of insulin: Secondary | ICD-10-CM | POA: Diagnosis not present

## 2014-07-14 DIAGNOSIS — I739 Peripheral vascular disease, unspecified: Secondary | ICD-10-CM | POA: Diagnosis not present

## 2014-07-14 DIAGNOSIS — I4891 Unspecified atrial fibrillation: Secondary | ICD-10-CM | POA: Diagnosis not present

## 2014-07-14 DIAGNOSIS — N319 Neuromuscular dysfunction of bladder, unspecified: Secondary | ICD-10-CM | POA: Diagnosis not present

## 2014-07-14 DIAGNOSIS — G8220 Paraplegia, unspecified: Secondary | ICD-10-CM | POA: Diagnosis not present

## 2014-07-14 DIAGNOSIS — I10 Essential (primary) hypertension: Secondary | ICD-10-CM | POA: Diagnosis not present

## 2014-07-14 DIAGNOSIS — Z7401 Bed confinement status: Secondary | ICD-10-CM | POA: Diagnosis not present

## 2014-08-05 DIAGNOSIS — S22009A Unspecified fracture of unspecified thoracic vertebra, initial encounter for closed fracture: Secondary | ICD-10-CM | POA: Diagnosis not present

## 2014-08-09 DIAGNOSIS — I4891 Unspecified atrial fibrillation: Secondary | ICD-10-CM | POA: Diagnosis not present

## 2014-08-09 DIAGNOSIS — G8220 Paraplegia, unspecified: Secondary | ICD-10-CM | POA: Diagnosis not present

## 2014-08-09 DIAGNOSIS — E119 Type 2 diabetes mellitus without complications: Secondary | ICD-10-CM | POA: Diagnosis not present

## 2014-08-09 DIAGNOSIS — S24103S Unspecified injury at T7-T10 level of thoracic spinal cord, sequela: Secondary | ICD-10-CM | POA: Diagnosis not present

## 2014-08-10 DIAGNOSIS — E119 Type 2 diabetes mellitus without complications: Secondary | ICD-10-CM | POA: Diagnosis not present

## 2014-08-10 DIAGNOSIS — I4891 Unspecified atrial fibrillation: Secondary | ICD-10-CM | POA: Diagnosis not present

## 2014-08-10 DIAGNOSIS — G8220 Paraplegia, unspecified: Secondary | ICD-10-CM | POA: Diagnosis not present

## 2014-08-20 DIAGNOSIS — S24103S Unspecified injury at T7-T10 level of thoracic spinal cord, sequela: Secondary | ICD-10-CM | POA: Diagnosis not present

## 2014-08-20 DIAGNOSIS — I4891 Unspecified atrial fibrillation: Secondary | ICD-10-CM | POA: Diagnosis not present

## 2014-08-20 DIAGNOSIS — E119 Type 2 diabetes mellitus without complications: Secondary | ICD-10-CM | POA: Diagnosis not present

## 2014-08-20 DIAGNOSIS — G8220 Paraplegia, unspecified: Secondary | ICD-10-CM | POA: Diagnosis not present

## 2014-09-03 DIAGNOSIS — S22009A Unspecified fracture of unspecified thoracic vertebra, initial encounter for closed fracture: Secondary | ICD-10-CM | POA: Diagnosis not present

## 2014-09-09 DIAGNOSIS — E119 Type 2 diabetes mellitus without complications: Secondary | ICD-10-CM | POA: Diagnosis not present

## 2014-09-09 DIAGNOSIS — S24103S Unspecified injury at T7-T10 level of thoracic spinal cord, sequela: Secondary | ICD-10-CM | POA: Diagnosis not present

## 2014-09-09 DIAGNOSIS — I4891 Unspecified atrial fibrillation: Secondary | ICD-10-CM | POA: Diagnosis not present

## 2014-09-09 DIAGNOSIS — G8220 Paraplegia, unspecified: Secondary | ICD-10-CM | POA: Diagnosis not present

## 2014-09-26 ENCOUNTER — Other Ambulatory Visit (HOSPITAL_COMMUNITY): Payer: Self-pay | Admitting: Neurosurgery

## 2014-09-26 ENCOUNTER — Ambulatory Visit (HOSPITAL_COMMUNITY)
Admission: RE | Admit: 2014-09-26 | Discharge: 2014-09-26 | Disposition: A | Discharge status: Home / Self Care | Payer: Self-pay | Source: Ambulatory Visit | Attending: Neurosurgery | Admitting: Neurosurgery

## 2014-09-26 DIAGNOSIS — T148XXA Other injury of unspecified body region, initial encounter: Secondary | ICD-10-CM

## 2014-09-26 DIAGNOSIS — X58XXXA Exposure to other specified factors, initial encounter: Secondary | ICD-10-CM | POA: Insufficient documentation

## 2014-09-26 DIAGNOSIS — M4606 Spinal enthesopathy, lumbar region: Secondary | ICD-10-CM | POA: Insufficient documentation

## 2014-09-26 DIAGNOSIS — S22079A Unspecified fracture of T9-T10 vertebra, initial encounter for closed fracture: Secondary | ICD-10-CM | POA: Insufficient documentation

## 2014-09-26 DIAGNOSIS — M4605 Spinal enthesopathy, thoracolumbar region: Secondary | ICD-10-CM | POA: Insufficient documentation

## 2014-09-26 DIAGNOSIS — Z01818 Encounter for other preprocedural examination: Secondary | ICD-10-CM | POA: Insufficient documentation

## 2015-06-07 ENCOUNTER — Other Ambulatory Visit (HOSPITAL_COMMUNITY): Payer: Self-pay | Admitting: Neurosurgery

## 2015-06-07 ENCOUNTER — Ambulatory Visit (HOSPITAL_COMMUNITY)
Admission: RE | Admit: 2015-06-07 | Discharge: 2015-06-07 | Disposition: A | Discharge status: Home / Self Care | Payer: Self-pay | Source: Ambulatory Visit | Attending: Neurosurgery | Admitting: Neurosurgery

## 2015-06-07 DIAGNOSIS — S22079D Unspecified fracture of T9-T10 vertebra, subsequent encounter for fracture with routine healing: Secondary | ICD-10-CM | POA: Insufficient documentation

## 2015-06-07 DIAGNOSIS — X58XXXD Exposure to other specified factors, subsequent encounter: Secondary | ICD-10-CM | POA: Insufficient documentation

## 2015-09-04 ENCOUNTER — Ambulatory Visit (HOSPITAL_COMMUNITY)
Admission: RE | Admit: 2015-09-04 | Discharge: 2015-09-04 | Disposition: A | Discharge status: Home / Self Care | Payer: Medicare Other | Source: Ambulatory Visit | Attending: Internal Medicine | Admitting: Internal Medicine

## 2015-09-04 ENCOUNTER — Other Ambulatory Visit (HOSPITAL_COMMUNITY): Payer: Self-pay | Admitting: Internal Medicine

## 2015-09-04 DIAGNOSIS — M8580 Other specified disorders of bone density and structure, unspecified site: Secondary | ICD-10-CM | POA: Insufficient documentation

## 2015-09-04 DIAGNOSIS — S99922A Unspecified injury of left foot, initial encounter: Secondary | ICD-10-CM

## 2016-01-13 ENCOUNTER — Encounter (HOSPITAL_COMMUNITY): Payer: Self-pay

## 2016-01-13 ENCOUNTER — Inpatient Hospital Stay (HOSPITAL_COMMUNITY)
Admission: EM | Admit: 2016-01-13 | Discharge: 2016-01-18 | DRG: 698 | Disposition: A | Discharge status: Skilled Nursing Facility | Payer: Medicare Other | Attending: Internal Medicine | Admitting: Internal Medicine

## 2016-01-13 DIAGNOSIS — S22070A Wedge compression fracture of T9-T10 vertebra, initial encounter for closed fracture: Secondary | ICD-10-CM | POA: Diagnosis present

## 2016-01-13 DIAGNOSIS — G8221 Paraplegia, complete: Secondary | ICD-10-CM | POA: Diagnosis present

## 2016-01-13 DIAGNOSIS — I10 Essential (primary) hypertension: Secondary | ICD-10-CM | POA: Diagnosis not present

## 2016-01-13 DIAGNOSIS — E119 Type 2 diabetes mellitus without complications: Secondary | ICD-10-CM | POA: Diagnosis present

## 2016-01-13 DIAGNOSIS — N319 Neuromuscular dysfunction of bladder, unspecified: Secondary | ICD-10-CM | POA: Diagnosis present

## 2016-01-13 DIAGNOSIS — R509 Fever, unspecified: Secondary | ICD-10-CM | POA: Diagnosis not present

## 2016-01-13 DIAGNOSIS — I482 Chronic atrial fibrillation, unspecified: Secondary | ICD-10-CM | POA: Diagnosis present

## 2016-01-13 DIAGNOSIS — M79673 Pain in unspecified foot: Secondary | ICD-10-CM

## 2016-01-13 DIAGNOSIS — Z79899 Other long term (current) drug therapy: Secondary | ICD-10-CM

## 2016-01-13 DIAGNOSIS — Z7401 Bed confinement status: Secondary | ICD-10-CM

## 2016-01-13 DIAGNOSIS — L89152 Pressure ulcer of sacral region, stage 2: Secondary | ICD-10-CM | POA: Diagnosis present

## 2016-01-13 DIAGNOSIS — N390 Urinary tract infection, site not specified: Secondary | ICD-10-CM | POA: Diagnosis present

## 2016-01-13 DIAGNOSIS — I4891 Unspecified atrial fibrillation: Secondary | ICD-10-CM | POA: Diagnosis not present

## 2016-01-13 DIAGNOSIS — Z66 Do not resuscitate: Secondary | ICD-10-CM | POA: Diagnosis present

## 2016-01-13 DIAGNOSIS — N3000 Acute cystitis without hematuria: Secondary | ICD-10-CM | POA: Diagnosis present

## 2016-01-13 DIAGNOSIS — A419 Sepsis, unspecified organism: Secondary | ICD-10-CM | POA: Diagnosis present

## 2016-01-13 DIAGNOSIS — D638 Anemia in other chronic diseases classified elsewhere: Secondary | ICD-10-CM | POA: Diagnosis present

## 2016-01-13 DIAGNOSIS — Z981 Arthrodesis status: Secondary | ICD-10-CM

## 2016-01-13 DIAGNOSIS — L8990 Pressure ulcer of unspecified site, unspecified stage: Secondary | ICD-10-CM | POA: Diagnosis present

## 2016-01-13 DIAGNOSIS — T83511A Infection and inflammatory reaction due to indwelling urethral catheter, initial encounter: Secondary | ICD-10-CM | POA: Diagnosis not present

## 2016-01-13 DIAGNOSIS — G8191 Hemiplegia, unspecified affecting right dominant side: Secondary | ICD-10-CM

## 2016-01-13 DIAGNOSIS — I69351 Hemiplegia and hemiparesis following cerebral infarction affecting right dominant side: Secondary | ICD-10-CM

## 2016-01-13 DIAGNOSIS — Z90710 Acquired absence of both cervix and uterus: Secondary | ICD-10-CM

## 2016-01-13 DIAGNOSIS — L89523 Pressure ulcer of left ankle, stage 3: Secondary | ICD-10-CM | POA: Diagnosis present

## 2016-01-13 DIAGNOSIS — M1990 Unspecified osteoarthritis, unspecified site: Secondary | ICD-10-CM | POA: Diagnosis present

## 2016-01-13 DIAGNOSIS — Z931 Gastrostomy status: Secondary | ICD-10-CM

## 2016-01-13 DIAGNOSIS — K592 Neurogenic bowel, not elsewhere classified: Secondary | ICD-10-CM | POA: Diagnosis present

## 2016-01-13 DIAGNOSIS — Z515 Encounter for palliative care: Secondary | ICD-10-CM

## 2016-01-13 DIAGNOSIS — E1165 Type 2 diabetes mellitus with hyperglycemia: Secondary | ICD-10-CM | POA: Diagnosis present

## 2016-01-13 DIAGNOSIS — IMO0002 Reserved for concepts with insufficient information to code with codable children: Secondary | ICD-10-CM | POA: Diagnosis present

## 2016-01-13 DIAGNOSIS — I998 Other disorder of circulatory system: Secondary | ICD-10-CM | POA: Diagnosis present

## 2016-01-13 DIAGNOSIS — E118 Type 2 diabetes mellitus with unspecified complications: Secondary | ICD-10-CM

## 2016-01-13 DIAGNOSIS — Z794 Long term (current) use of insulin: Secondary | ICD-10-CM

## 2016-01-13 DIAGNOSIS — I69320 Aphasia following cerebral infarction: Secondary | ICD-10-CM

## 2016-01-13 DIAGNOSIS — I739 Peripheral vascular disease, unspecified: Secondary | ICD-10-CM | POA: Diagnosis present

## 2016-01-13 DIAGNOSIS — Z6835 Body mass index (BMI) 35.0-35.9, adult: Secondary | ICD-10-CM

## 2016-01-13 DIAGNOSIS — E669 Obesity, unspecified: Secondary | ICD-10-CM | POA: Diagnosis present

## 2016-01-13 LAB — CBC WITH DIFFERENTIAL/PLATELET
Basophils Absolute: 0.1 10*3/uL (ref 0.0–0.1)
Basophils Relative: 1 %
EOS PCT: 10 %
Eosinophils Absolute: 1.4 10*3/uL — ABNORMAL HIGH (ref 0.0–0.7)
HCT: 35.5 % — ABNORMAL LOW (ref 36.0–46.0)
HEMOGLOBIN: 10.9 g/dL — AB (ref 12.0–15.0)
LYMPHS ABS: 2.3 10*3/uL (ref 0.7–4.0)
LYMPHS PCT: 16 %
MCH: 29.6 pg (ref 26.0–34.0)
MCHC: 30.7 g/dL (ref 30.0–36.0)
MCV: 96.5 fL (ref 78.0–100.0)
MONOS PCT: 10 %
Monocytes Absolute: 1.5 10*3/uL — ABNORMAL HIGH (ref 0.1–1.0)
Neutro Abs: 9.5 10*3/uL — ABNORMAL HIGH (ref 1.7–7.7)
Neutrophils Relative %: 63 %
PLATELETS: 522 10*3/uL — AB (ref 150–400)
RBC: 3.68 MIL/uL — ABNORMAL LOW (ref 3.87–5.11)
RDW: 14.4 % (ref 11.5–15.5)
WBC: 14.8 10*3/uL — AB (ref 4.0–10.5)

## 2016-01-13 LAB — I-STAT CG4 LACTIC ACID, ED
LACTIC ACID, VENOUS: 1.07 mmol/L (ref 0.5–1.9)
Lactic Acid, Venous: 2.63 mmol/L (ref 0.5–1.9)

## 2016-01-13 LAB — URINE MICROSCOPIC-ADD ON
RBC / HPF: NONE SEEN RBC/hpf (ref 0–5)
Squamous Epithelial / LPF: NONE SEEN

## 2016-01-13 LAB — BASIC METABOLIC PANEL
Anion gap: 10 (ref 5–15)
BUN: 43 mg/dL — AB (ref 6–20)
CALCIUM: 9.4 mg/dL (ref 8.9–10.3)
CHLORIDE: 97 mmol/L — AB (ref 101–111)
CO2: 30 mmol/L (ref 22–32)
CREATININE: 0.76 mg/dL (ref 0.44–1.00)
GFR calc Af Amer: >60 mL/min (ref >60)
Glucose, Bld: 103 mg/dL — ABNORMAL HIGH (ref 65–99)
POTASSIUM: 4.5 mmol/L (ref 3.5–5.1)
Sodium: 137 mmol/L (ref 135–145)

## 2016-01-13 LAB — URINALYSIS, ROUTINE W REFLEX MICROSCOPIC
BILIRUBIN URINE: NEGATIVE
Glucose, UA: NEGATIVE mg/dL
HGB URINE DIPSTICK: NEGATIVE
Ketones, ur: NEGATIVE mg/dL
Nitrite: POSITIVE — AB
PH: 8.5 — AB (ref 5.0–8.0)
Protein, ur: NEGATIVE mg/dL
SPECIFIC GRAVITY, URINE: 1.023 (ref 1.005–1.030)

## 2016-01-13 MED ORDER — DILTIAZEM LOAD VIA INFUSION
20.0000 mg | Freq: Once | INTRAVENOUS | Status: AC
  Administered 2016-01-13: 20 mg via INTRAVENOUS
  Filled 2016-01-13: qty 20

## 2016-01-13 MED ORDER — DILTIAZEM HCL 100 MG IV SOLR: 5.0000 mg/h | Freq: Once | INTRAVENOUS | Status: DC

## 2016-01-13 MED ORDER — SODIUM CHLORIDE 0.9 % IV BOLUS (SEPSIS)
1000.0000 mL | Freq: Once | INTRAVENOUS | Status: AC
  Administered 2016-01-13: 1000 mL via INTRAVENOUS

## 2016-01-13 MED ORDER — SODIUM CHLORIDE 0.9 % IV BOLUS (SEPSIS): 1000.0000 mL | Freq: Once | INTRAVENOUS | Status: DC

## 2016-01-13 MED ORDER — SULFAMETHOXAZOLE-TRIMETHOPRIM 800-160 MG PO TABS: 1.0000 | ORAL_TABLET | Freq: Two times a day (BID) | ORAL | Status: DC

## 2016-01-13 MED ORDER — ENOXAPARIN SODIUM 30 MG/0.3ML ~~LOC~~ SOLN: 30.0000 mg | Freq: Two times a day (BID) | SUBCUTANEOUS | Status: DC

## 2016-01-13 MED ORDER — DEXTROSE 5 % IV SOLN
1.0000 g | Freq: Once | INTRAVENOUS | Status: AC
  Administered 2016-01-13: 1 g via INTRAVENOUS
  Filled 2016-01-13: qty 10

## 2016-01-13 MED ORDER — ENOXAPARIN SODIUM 100 MG/ML ~~LOC~~ SOLN
1.0000 mg/kg | Freq: Once | SUBCUTANEOUS | Status: AC
  Administered 2016-01-13: 95 mg via SUBCUTANEOUS
  Filled 2016-01-13: qty 1

## 2016-01-13 MED ORDER — DIAZEPAM 5 MG PO TABS
5.0000 mg | ORAL_TABLET | Freq: Once | ORAL | Status: AC
  Administered 2016-01-13: 5 mg via ORAL
  Filled 2016-01-13: qty 1

## 2016-01-13 MED ORDER — DILTIAZEM HCL 100 MG IV SOLR
5.0000 mg/h | INTRAVENOUS | Status: DC
  Administered 2016-01-13: 5 mg/h via INTRAVENOUS
  Filled 2016-01-13: qty 100

## 2016-01-13 MED ORDER — OXYCODONE-ACETAMINOPHEN 5-325 MG PO TABS
1.0000 | ORAL_TABLET | Freq: Once | ORAL | Status: AC
  Administered 2016-01-13: 1 via ORAL
  Filled 2016-01-13: qty 1

## 2016-01-13 NOTE — ED Notes (Signed)
Pt moves hand and dislodges IV.

## 2016-01-13 NOTE — ED Notes (Addendum)
Wound noted to top of left foot approx 2x2 inches with dressing in place from nrsing home. Heel protector at left foot removed for blackened are at left heel approx size half dollar. Gtube and cath noted from Nursing home.

## 2016-01-13 NOTE — ED Notes (Signed)
Pt presents from nursing facility for discoloration of her left foot, per staff it has been blue but is better after moving it, patient is warm to touch, patients pedal pulses are present and strong, has foley catheter in place

## 2016-01-13 NOTE — H&P (Signed)
Yolanda Solis [id number] DOB: 02-May-1934 DOA: 01/13/2016     PCP: Glo Herring., MD   Outpatient Specialists: Neurosurgery Vertell Limber    Patient coming from:  From facility [facility]  Chief Complaint: Discoloration of the left foot  HPI: Yolanda Solis is a 82 y.o. 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  Presented from nursing home facility with change in color of her foot initially it was blue but better after patient noted foot discomfort and warm to the touch patient presented to emergency department Family deneis any fever, Patietn has PEG tube and Foley in place   IN ER: She was found to be tachycardic up to 139 father workup showed urinary tract infection Patient made initially sepsis criteria with heart rate up to 139 WBC 14.8 lactic acid 2.63    Hospitalist was called for admission for sepsis due to urinary tract infection  Review of Systems:   Unable to obtain due to patient being nonverbal    Past Medical History: Past Medical History  Diagnosis Date  . Diabetes mellitus   . Hypertension   . Arthritis    Past Surgical History  Procedure Laterality Date  . Abdominal hysterectomy    . Posterior lumbar fusion 4 level N/A 02/17/2014    Procedure: Thoracolumbar Repair;  Surgeon: Erline Levine, MD;  Location: [facility];  Service: Neurosurgery;  Laterality: N/A;  Thoracolumbar Repair T8 - T12     Social History:  Ambulatory   bed bound    reports that she has never smoked. She does not have any smokeless tobacco history on file. She reports that she does not drink alcohol or use illicit drugs.  Allergies:   Allergies  Allergen Reactions  . Other Rash    Bandaids       Family History:    Family History  Problem Relation Age of Onset  . Heart failure Mother   .  Heart failure Father   . Stroke Neg Hx   . Cancer Neg Hx     Medications: Prior to Admission medications   Medication Sig Start Date End Date Taking? Authorizing Provider  ALPRAZolam Duanne Moron) 0.25 MG tablet Take one tablet by mouth three times daily as needed for anixety 03/15/14  Yes Mahima Pandey, MD  Amino Acids-Protein Hydrolys (FEEDING SUPPLEMENT, PRO-STAT SUGAR FREE 64,) LIQD Take 30 mLs by mouth daily.   Yes Historical Provider, MD  calcium-vitamin D (OSCAL WITH D) 500-200 MG-UNIT tablet Take 1 tablet by mouth.   Yes Historical Provider, MD  diazepam (VALIUM) 5 MG tablet Take 0.5 tablets (2.5 mg total) by mouth every 8 (eight) hours as needed for anxiety. 02/16/14  Yes Virgel Manifold, MD  docusate sodium (COLACE) 100 MG capsule Take 1 capsule (100 mg total) by mouth every 12 (twelve) hours. 02/16/14  Yes Virgel Manifold, MD  ibuprofen (ADVIL,MOTRIN) 200 MG tablet Take 400 mg by mouth at bedtime.    Yes Historical Provider, MD  insulin glargine (LANTUS) 100 UNIT/ML injection Inject 20-45 Units into the skin 2 (two) times daily. Inject 45 units each morning and 20 units each evening.   Yes Historical Provider, MD  insulin lispro (HUMALOG) 100 UNIT/ML injection Inject 9 Units into the skin 3 (three) times daily before meals.   Yes Historical Provider, MD  lisinopril (PRINIVIL,ZESTRIL) 5 MG tablet Take 5  mg by mouth daily.   Yes Historical Provider, MD  loratadine (ALLERGY RELIEF) 10 MG tablet Take 10 mg by mouth daily.   Yes Historical Provider, MD  metFORMIN (GLUCOPHAGE) 500 MG tablet Take 1 tablet by mouth 2 (two) times daily.   Yes Historical Provider, MD  metoprolol succinate (TOPROL-XL) 25 MG 24 hr tablet Take 1 tablet by mouth 2 (two) times daily. 11/20/15  Yes Historical Provider, MD  oxyCODONE-acetaminophen (PERCOCET/ROXICET) 5-325 MG per tablet Take 1-2 tablets by mouth every 4 (four) hours as needed for severe pain. 02/16/14  Yes Virgel Manifold, MD  Polyethylene Glycol 3350 (MIRALAX PO) Take  17 g by mouth daily.   Yes Historical Provider, MD  vitamin C (ASCORBIC ACID) 500 MG tablet Take 500 mg by mouth daily.   Yes Historical Provider, MD  zinc sulfate 220 (50 Zn) MG capsule Take 220 mg by mouth daily.   Yes Historical Provider, MD  enoxaparin (LOVENOX) 30 MG/0.3ML injection Inject 0.3 mLs (30 mg total) into the skin every 12 (twelve) hours. 01/13/16   Allie Bossier, MD  HYDROcodone-acetaminophen (NORCO/VICODIN) 5-325 MG per tablet Take two tablets by mouth every 4 hours as needed for pain 03/15/14   Blanchie Serve, MD  sulfamethoxazole-trimethoprim (BACTRIM DS,SEPTRA DS) 800-160 MG tablet Take 1 tablet by mouth 2 (two) times daily. 01/13/16 01/20/16  Allie Bossier, MD    Physical Exam: Patient Vitals for the past 24 hrs:  BP Temp Temp src Pulse Resp SpO2 Weight  01/13/16 2330 123/67 mmHg - - 113 20 97 % -  01/13/16 2323 - 99.3 F (37.4 C) Rectal - - - -  01/13/16 2315 136/66 mmHg - - (!) 48 20 96 % -  01/13/16 2300 (!) 145/124 mmHg - - (!) 139 17 98 % -  01/13/16 2245 (!) 137/106 mmHg - - (!) 58 17 96 % -  01/13/16 2230 134/85 mmHg - - 119 16 96 % -  01/13/16 2215 124/95 mmHg - - (!) 133 17 97 % -  01/13/16 2200 143/67 mmHg - - (!) 128 16 96 % -  01/13/16 2145 122/60 mmHg - - 91 16 99 % -  01/13/16 2130 130/56 mmHg - - 95 19 99 % -  01/13/16 2115 (!) 128/50 mmHg - - 93 16 99 % -  01/13/16 2100 123/57 mmHg - - 89 14 100 % -  01/13/16 2045 129/55 mmHg - - 92 20 98 % -  01/13/16 2030 115/61 mmHg - - 96 18 99 % -  01/13/16 2015 124/72 mmHg - - 87 16 100 % -  01/13/16 2000 127/56 mmHg - - 87 15 100 % -  01/13/16 1945 120/59 mmHg - - 85 18 100 % -  01/13/16 1930 (!) 135/54 mmHg - - 85 17 100 % -  01/13/16 1915 131/56 mmHg - - 83 16 100 % -  01/13/16 1900 (!) 129/52 mmHg - - 82 16 100 % -  01/13/16 1845 136/63 mmHg - - 88 19 100 % -  01/13/16 1830 130/63 mmHg - - 85 19 100 % -  01/13/16 1815 144/83 mmHg - - 84 16 100 % -  01/13/16 1800 145/66 mmHg - - 86 16 100 % -  01/13/16  1745 139/72 mmHg - - 86 20 98 % -  01/13/16 1730 116/67 mmHg - - 76 16 100 % -  01/13/16 1715 119/70 mmHg - - 81 16 100 % -  01/13/16 1705 - 99.6 F (37.6 C) Rectal - - - -  01/13/16 1700 112/84 mmHg - - 78 16 99 % -  01/13/16 1645 134/67 mmHg - - 75 - 98 % -  01/13/16 1630 130/60 mmHg - - 80 - 96 % -  01/13/16 1615 135/63 mmHg - - 84 - 97 % -  01/13/16 1614 142/86 mmHg 99.2 F (37.3 C) Axillary 86 20 100 % 92.987 kg (205 lb)    1. General:  in No Acute distress 2. Psychological: Alert and   Oriented 3. Head/ENT:     Dry Mucous Membranes                          Head Non traumatic, neck supple                            Poor Dentition 4. SKIN:  decreased Skin turgor,  Skin clean Dry and intact no rash 5. Heart: irRegular rate and rhythm no  Murmur, Rub or gallop 6. Lungs: Clear to auscultation bilaterally, no wheezes or crackles   7. Abdomen: Soft, non-tender, Non distended 8. Lower extremities: no clubbing, cyanosis, or edema 9. Neurologically right hemiparesis, paraplegia 10. MSK: Normal range of motion   body mass index is 35.17 kg/(m^2).  Labs on Admission:   Labs on Admission: I have personally reviewed following labs and imaging studies  CBC:  Recent Labs Lab 01/13/16 1715  WBC 14.8*  NEUTROABS 9.5*  HGB 10.9*  HCT 35.5*  MCV 96.5  PLT [id number]*   Basic Metabolic Panel:  Recent Labs Lab 01/13/16 1715  NA 137  K 4.5  CL 97*  CO2 30  GLUCOSE 103*  BUN 43*  CREATININE 0.76  CALCIUM 9.4   GFR: CrCl cannot be calculated (Unknown ideal weight.). Liver Function Tests: No results for input(s): AST, ALT, ALKPHOS, BILITOT, PROT, ALBUMIN in the last 168 hours. No results for input(s): LIPASE, AMYLASE in the last 168 hours. No results for input(s): AMMONIA in the last 168 hours. Coagulation Profile: No results for input(s): INR, PROTIME in the last 168 hours. Cardiac Enzymes: No results for input(s): CKTOTAL, CKMB, CKMBINDEX, TROPONINI in the last 168  hours. BNP (last 3 results) No results for input(s): PROBNP in the last 8760 hours. HbA1C: No results for input(s): HGBA1C in the last 72 hours. CBG: No results for input(s): GLUCAP in the last 168 hours. Lipid Profile: No results for input(s): CHOL, HDL, LDLCALC, TRIG, CHOLHDL, LDLDIRECT in the last 72 hours. Thyroid Function Tests: No results for input(s): TSH, T4TOTAL, FREET4, T3FREE, THYROIDAB in the last 72 hours. Anemia Panel: No results for input(s): VITAMINB12, FOLATE, FERRITIN, TIBC, IRON, RETICCTPCT in the last 72 hours. Urine analysis:    Component Value Date/Time   COLORURINE YELLOW 01/13/2016 1859   APPEARANCEUR CLOUDY* 01/13/2016 1859   LABSPEC 1.023 01/13/2016 1859   PHURINE 8.5* 01/13/2016 1859   GLUCOSEU NEGATIVE 01/13/2016 1859   HGBUR NEGATIVE 01/13/2016 1859   BILIRUBINUR NEGATIVE 01/13/2016 1859   KETONESUR NEGATIVE 01/13/2016 1859   PROTEINUR NEGATIVE 01/13/2016 1859   UROBILINOGEN 1.0 03/11/2014 1044   NITRITE POSITIVE* 01/13/2016 1859   LEUKOCYTESUR LARGE* 01/13/2016 1859   Sepsis Labs: @LABRCNTIP (procalcitonin:4,lacticidven:4) )No results found for this or any previous visit (from the past 240 hour(s)).     UA   evidence of UTI  Lab Results  Component Value Date   HGBA1C 7.9* 02/19/2014    CrCl cannot be calculated (Unknown ideal weight.).  BNP (last 3  results) No results for input(s): PROBNP in the last 8760 hours.   ECG REPORT ordered  [facility] Weights   01/13/16 1614  Weight: 92.987 kg (205 lb)     Cultures:    Component Value Date/Time   SDES URINE, CLEAN CATCH 03/11/2014 1044   SPECREQUEST NONE 03/11/2014 1044   CULT  03/11/2014 1044    ESCHERICHIA COLI Performed at [place] 03/13/2014 FINAL 03/11/2014 1044     Radiological Exams on Admission: No results found.  Chart has been reviewed    Assessment/Plan  82 y.o. 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 Being admitted for sepsis secondary to UTI and possibly left foot ischemia in a setting of uncontrolled and anticoagulated atrial fibrillation  Present on Admission:  Sepsis likely secondary to UTI versus ischemia of lower extremity covered with Rocephin and vancomycin admit to step down and follow lactic acid continue aggressive fluid resuscitation Worrisome for low extremity ischemia. Family does not wish any aggressive interventions would be okay with amputation if necessary. Will consult vascular in the morning obtain ABI  . HTN (hypertension) and off on home medications  . UTI (lower urinary tract infection) Rocephin await results of urine culture  . Atrial fibrillation with rapid ventricular response (HCC) - we'll admit to step down on diltiazem drip initiate heparin given possibly embolic event  Other plan as per orders.  DVT prophylaxis:    Lovenox     Code Status:    DNR/DNI  as per  family   Family Communication:   Family   at  Bedside  plan of care was discussed with  Son Yolanda Solis [id number]  Disposition Plan:                              Back to current facility when stable                  Consults called: none  Admission status:    inpatient     Level of care    SDU     [place] 01/13/2016, 12:16 AM    [facility]  Pager [phone]   after 2 AM please page floor coverage PA If 7AM-7PM, please contact the day team taking care of the patient  [email or web address]  Password [email or web address]

## 2016-01-13 NOTE — ED Notes (Signed)
Urine collected from existing cath. At port

## 2016-01-14 ENCOUNTER — Encounter (HOSPITAL_COMMUNITY): Payer: Self-pay | Admitting: *Deleted

## 2016-01-14 ENCOUNTER — Encounter (HOSPITAL_COMMUNITY): Payer: Medicare Other

## 2016-01-14 ENCOUNTER — Inpatient Hospital Stay (HOSPITAL_COMMUNITY): Payer: Medicare Other

## 2016-01-14 DIAGNOSIS — D638 Anemia in other chronic diseases classified elsewhere: Secondary | ICD-10-CM | POA: Diagnosis present

## 2016-01-14 DIAGNOSIS — I10 Essential (primary) hypertension: Secondary | ICD-10-CM | POA: Diagnosis present

## 2016-01-14 DIAGNOSIS — L8990 Pressure ulcer of unspecified site, unspecified stage: Secondary | ICD-10-CM | POA: Diagnosis present

## 2016-01-14 DIAGNOSIS — L89152 Pressure ulcer of sacral region, stage 2: Secondary | ICD-10-CM | POA: Diagnosis present

## 2016-01-14 DIAGNOSIS — G8191 Hemiplegia, unspecified affecting right dominant side: Secondary | ICD-10-CM

## 2016-01-14 DIAGNOSIS — R509 Fever, unspecified: Secondary | ICD-10-CM | POA: Diagnosis present

## 2016-01-14 DIAGNOSIS — N319 Neuromuscular dysfunction of bladder, unspecified: Secondary | ICD-10-CM | POA: Diagnosis present

## 2016-01-14 DIAGNOSIS — I69320 Aphasia following cerebral infarction: Secondary | ICD-10-CM | POA: Diagnosis not present

## 2016-01-14 DIAGNOSIS — K592 Neurogenic bowel, not elsewhere classified: Secondary | ICD-10-CM | POA: Diagnosis present

## 2016-01-14 DIAGNOSIS — Z7401 Bed confinement status: Secondary | ICD-10-CM | POA: Diagnosis not present

## 2016-01-14 DIAGNOSIS — N3000 Acute cystitis without hematuria: Secondary | ICD-10-CM | POA: Diagnosis not present

## 2016-01-14 DIAGNOSIS — A419 Sepsis, unspecified organism: Secondary | ICD-10-CM | POA: Diagnosis present

## 2016-01-14 DIAGNOSIS — Z981 Arthrodesis status: Secondary | ICD-10-CM | POA: Diagnosis not present

## 2016-01-14 DIAGNOSIS — E118 Type 2 diabetes mellitus with unspecified complications: Secondary | ICD-10-CM | POA: Diagnosis not present

## 2016-01-14 DIAGNOSIS — Z931 Gastrostomy status: Secondary | ICD-10-CM | POA: Diagnosis not present

## 2016-01-14 DIAGNOSIS — Z794 Long term (current) use of insulin: Secondary | ICD-10-CM | POA: Diagnosis not present

## 2016-01-14 DIAGNOSIS — Z515 Encounter for palliative care: Secondary | ICD-10-CM | POA: Diagnosis not present

## 2016-01-14 DIAGNOSIS — I998 Other disorder of circulatory system: Secondary | ICD-10-CM

## 2016-01-14 DIAGNOSIS — L89523 Pressure ulcer of left ankle, stage 3: Secondary | ICD-10-CM | POA: Diagnosis present

## 2016-01-14 DIAGNOSIS — I482 Chronic atrial fibrillation: Secondary | ICD-10-CM | POA: Diagnosis present

## 2016-01-14 DIAGNOSIS — Z79899 Other long term (current) drug therapy: Secondary | ICD-10-CM | POA: Diagnosis not present

## 2016-01-14 DIAGNOSIS — T83511A Infection and inflammatory reaction due to indwelling urethral catheter, initial encounter: Secondary | ICD-10-CM | POA: Diagnosis present

## 2016-01-14 DIAGNOSIS — I69351 Hemiplegia and hemiparesis following cerebral infarction affecting right dominant side: Secondary | ICD-10-CM | POA: Diagnosis not present

## 2016-01-14 DIAGNOSIS — I739 Peripheral vascular disease, unspecified: Secondary | ICD-10-CM | POA: Diagnosis present

## 2016-01-14 DIAGNOSIS — E119 Type 2 diabetes mellitus without complications: Secondary | ICD-10-CM | POA: Diagnosis present

## 2016-01-14 DIAGNOSIS — M79672 Pain in left foot: Secondary | ICD-10-CM | POA: Diagnosis not present

## 2016-01-14 DIAGNOSIS — M1990 Unspecified osteoarthritis, unspecified site: Secondary | ICD-10-CM | POA: Diagnosis present

## 2016-01-14 DIAGNOSIS — I4891 Unspecified atrial fibrillation: Secondary | ICD-10-CM | POA: Diagnosis not present

## 2016-01-14 DIAGNOSIS — E669 Obesity, unspecified: Secondary | ICD-10-CM | POA: Diagnosis present

## 2016-01-14 DIAGNOSIS — Z90710 Acquired absence of both cervix and uterus: Secondary | ICD-10-CM | POA: Diagnosis not present

## 2016-01-14 DIAGNOSIS — N390 Urinary tract infection, site not specified: Secondary | ICD-10-CM | POA: Diagnosis present

## 2016-01-14 DIAGNOSIS — I999 Unspecified disorder of circulatory system: Secondary | ICD-10-CM | POA: Diagnosis not present

## 2016-01-14 DIAGNOSIS — G8221 Paraplegia, complete: Secondary | ICD-10-CM | POA: Diagnosis present

## 2016-01-14 DIAGNOSIS — S22070A Wedge compression fracture of T9-T10 vertebra, initial encounter for closed fracture: Secondary | ICD-10-CM | POA: Diagnosis not present

## 2016-01-14 LAB — COMPREHENSIVE METABOLIC PANEL
ALK PHOS: 60 U/L (ref 38–126)
ALT: 10 U/L — AB (ref 14–54)
ANION GAP: 8 (ref 5–15)
AST: 23 U/L (ref 15–41)
Albumin: 2.3 g/dL — ABNORMAL LOW (ref 3.5–5.0)
BILIRUBIN TOTAL: 0.6 mg/dL (ref 0.3–1.2)
BUN: 32 mg/dL — ABNORMAL HIGH (ref 6–20)
CALCIUM: 8.7 mg/dL — AB (ref 8.9–10.3)
CO2: 27 mmol/L (ref 22–32)
CREATININE: 0.81 mg/dL (ref 0.44–1.00)
Chloride: 103 mmol/L (ref 101–111)
Glucose, Bld: 154 mg/dL — ABNORMAL HIGH (ref 65–99)
Potassium: 4 mmol/L (ref 3.5–5.1)
SODIUM: 138 mmol/L (ref 135–145)
TOTAL PROTEIN: 6.4 g/dL — AB (ref 6.5–8.1)

## 2016-01-14 LAB — URINE CULTURE

## 2016-01-14 LAB — CBC
HCT: 37.2 % (ref 36.0–46.0)
HEMOGLOBIN: 11.3 g/dL — AB (ref 12.0–15.0)
MCH: 29.2 pg (ref 26.0–34.0)
MCHC: 30.4 g/dL (ref 30.0–36.0)
MCV: 96.1 fL (ref 78.0–100.0)
PLATELETS: 510 10*3/uL — AB (ref 150–400)
RBC: 3.87 MIL/uL (ref 3.87–5.11)
RDW: 14.6 % (ref 11.5–15.5)
WBC: 15.2 10*3/uL — AB (ref 4.0–10.5)

## 2016-01-14 LAB — GLUCOSE, CAPILLARY
GLUCOSE-CAPILLARY: 143 mg/dL — AB (ref 65–99)
GLUCOSE-CAPILLARY: 149 mg/dL — AB (ref 65–99)
GLUCOSE-CAPILLARY: 123 mg/dL — AB (ref 65–99)
Glucose-Capillary: 70 mg/dL (ref 65–99)
Glucose-Capillary: 85 mg/dL (ref 65–99)

## 2016-01-14 LAB — TROPONIN I
TROPONIN I: 0.06 ng/mL — AB (ref <0.03)
Troponin I: <0.03 ng/mL (ref <0.03)
Troponin I: 0.04 ng/mL (ref <0.03)

## 2016-01-14 LAB — HEPARIN LEVEL (UNFRACTIONATED): Heparin Unfractionated: 0.15 IU/mL — ABNORMAL LOW (ref 0.30–0.70)

## 2016-01-14 LAB — PROTIME-INR
INR: 1.13 (ref 0.00–1.49)
Prothrombin Time: 14.6 seconds (ref 11.6–15.2)

## 2016-01-14 LAB — MAGNESIUM: MAGNESIUM: 1.9 mg/dL (ref 1.7–2.4)

## 2016-01-14 LAB — APTT: aPTT: 36 seconds (ref 24–37)

## 2016-01-14 LAB — MRSA PCR SCREENING: MRSA BY PCR: POSITIVE — AB

## 2016-01-14 LAB — PHOSPHORUS: Phosphorus: 3.7 mg/dL (ref 2.5–4.6)

## 2016-01-14 LAB — TSH: TSH: 1.973 u[IU]/mL (ref 0.350–4.500)

## 2016-01-14 MED ORDER — ACETAMINOPHEN 650 MG RE SUPP: 650.0000 mg | Freq: Four times a day (QID) | RECTAL | Status: DC | PRN

## 2016-01-14 MED ORDER — SODIUM CHLORIDE 0.9 % IV SOLN
INTRAVENOUS | Status: DC
  Administered 2016-01-14: 12:00:00 via INTRAVENOUS
  Administered 2016-01-15: 1000 mL via INTRAVENOUS

## 2016-01-14 MED ORDER — INSULIN ASPART 100 UNIT/ML ~~LOC~~ SOLN
0.0000 [IU] | SUBCUTANEOUS | Status: DC
  Administered 2016-01-14 – 2016-01-17 (×7): 1 [IU] via SUBCUTANEOUS
  Administered 2016-01-17 – 2016-01-18 (×2): 2 [IU] via SUBCUTANEOUS
  Administered 2016-01-18: 3 [IU] via SUBCUTANEOUS
  Administered 2016-01-18: 2 [IU] via SUBCUTANEOUS
  Administered 2016-01-18: 1 [IU] via SUBCUTANEOUS
  Administered 2016-01-18: 2 [IU] via SUBCUTANEOUS

## 2016-01-14 MED ORDER — LORAZEPAM 2 MG/ML IJ SOLN: 1.0000 mg | INTRAMUSCULAR | Status: DC | PRN

## 2016-01-14 MED ORDER — MORPHINE SULFATE (PF) 2 MG/ML IV SOLN
2.0000 mg | INTRAVENOUS | Status: DC | PRN
Start: 2016-01-14 — End: 2016-01-14
  Administered 2016-01-14: 2 mg via INTRAVENOUS
  Filled 2016-01-14: qty 1

## 2016-01-14 MED ORDER — LORAZEPAM 2 MG/ML IJ SOLN
1.0000 mg | INTRAMUSCULAR | Status: DC | PRN
  Administered 2016-01-14 – 2016-01-16 (×6): 2 mg via INTRAVENOUS
  Filled 2016-01-14 (×6): qty 1

## 2016-01-14 MED ORDER — DEXTROSE 5 % IV SOLN
1.0000 g | INTRAVENOUS | Status: DC
  Administered 2016-01-14 – 2016-01-17 (×4): 1 g via INTRAVENOUS
  Filled 2016-01-14 (×5): qty 10

## 2016-01-14 MED ORDER — HYDROCODONE-ACETAMINOPHEN 5-325 MG PO TABS
1.0000 | ORAL_TABLET | ORAL | Status: DC | PRN
  Administered 2016-01-14: 1 via ORAL
  Filled 2016-01-14: qty 1

## 2016-01-14 MED ORDER — HEPARIN BOLUS VIA INFUSION
1000.0000 [IU] | Freq: Once | INTRAVENOUS | Status: AC
  Administered 2016-01-14: 1000 [IU] via INTRAVENOUS
  Filled 2016-01-14: qty 1000

## 2016-01-14 MED ORDER — OXYCODONE-ACETAMINOPHEN 5-325 MG PO TABS
1.0000 | ORAL_TABLET | Freq: Once | ORAL | Status: DC
Start: 2016-01-14 — End: 2016-01-14
  Filled 2016-01-14: qty 1

## 2016-01-14 MED ORDER — DILTIAZEM LOAD VIA INFUSION: 20.0000 mg | Freq: Once | INTRAVENOUS | Status: DC

## 2016-01-14 MED ORDER — ENOXAPARIN SODIUM 100 MG/ML ~~LOC~~ SOLN: 1.0000 mg/kg | Freq: Once | SUBCUTANEOUS | Status: DC

## 2016-01-14 MED ORDER — INSULIN GLARGINE 100 UNIT/ML ~~LOC~~ SOLN
45.0000 [IU] | Freq: Every day | SUBCUTANEOUS | Status: DC
Start: 2016-01-14 — End: 2016-01-14
  Administered 2016-01-14: 45 [IU] via SUBCUTANEOUS
  Filled 2016-01-14: qty 0.45

## 2016-01-14 MED ORDER — INSULIN GLARGINE 100 UNIT/ML ~~LOC~~ SOLN
10.0000 [IU] | Freq: Every day | SUBCUTANEOUS | Status: DC
  Administered 2016-01-14 – 2016-01-17 (×4): 10 [IU] via SUBCUTANEOUS
  Filled 2016-01-14 (×5): qty 0.1

## 2016-01-14 MED ORDER — VANCOMYCIN HCL 10 G IV SOLR
1500.0000 mg | Freq: Once | INTRAVENOUS | Status: AC
  Administered 2016-01-14: 1500 mg via INTRAVENOUS
  Filled 2016-01-14: qty 1500

## 2016-01-14 MED ORDER — DIAZEPAM 5 MG PO TABS: 5.0000 mg | ORAL_TABLET | Freq: Once | ORAL | Status: DC

## 2016-01-14 MED ORDER — SODIUM CHLORIDE 0.9% FLUSH
3.0000 mL | Freq: Two times a day (BID) | INTRAVENOUS | Status: DC
  Administered 2016-01-14 – 2016-01-18 (×5): 3 mL via INTRAVENOUS

## 2016-01-14 MED ORDER — DIAZEPAM 5 MG PO TABS
2.5000 mg | ORAL_TABLET | Freq: Three times a day (TID) | ORAL | Status: DC | PRN
  Administered 2016-01-14: 2.5 mg via ORAL
  Filled 2016-01-14: qty 1

## 2016-01-14 MED ORDER — ONDANSETRON HCL 4 MG PO TABS: 4.0000 mg | ORAL_TABLET | Freq: Four times a day (QID) | ORAL | Status: DC | PRN

## 2016-01-14 MED ORDER — ONDANSETRON HCL 4 MG/2ML IJ SOLN: 4.0000 mg | Freq: Four times a day (QID) | INTRAMUSCULAR | Status: DC | PRN

## 2016-01-14 MED ORDER — HEPARIN (PORCINE) IN NACL 100-0.45 UNIT/ML-% IJ SOLN
1400.0000 [IU]/h | INTRAMUSCULAR | Status: DC
  Administered 2016-01-14: 900 [IU]/h via INTRAVENOUS
  Filled 2016-01-14 (×4): qty 250

## 2016-01-14 MED ORDER — DILTIAZEM HCL 100 MG IV SOLR
5.0000 mg/h | INTRAVENOUS | Status: AC
  Administered 2016-01-14 – 2016-01-15 (×4): 5 mg/h via INTRAVENOUS
  Filled 2016-01-14 (×4): qty 100

## 2016-01-14 MED ORDER — SODIUM CHLORIDE 0.9 % IV SOLN
INTRAVENOUS | Status: DC
  Administered 2016-01-14: 06:00:00 via INTRAVENOUS

## 2016-01-14 MED ORDER — MORPHINE SULFATE (PF) 2 MG/ML IV SOLN
2.0000 mg | INTRAVENOUS | Status: DC | PRN
  Administered 2016-01-14 – 2016-01-15 (×3): 4 mg via INTRAVENOUS
  Administered 2016-01-15 (×4): 2 mg via INTRAVENOUS
  Administered 2016-01-16: 4 mg via INTRAVENOUS
  Administered 2016-01-16: 2 mg via INTRAVENOUS
  Administered 2016-01-17 (×4): 4 mg via INTRAVENOUS
  Filled 2016-01-14 (×2): qty 2
  Filled 2016-01-14 (×2): qty 1
  Filled 2016-01-14 (×4): qty 2
  Filled 2016-01-14 (×2): qty 1
  Filled 2016-01-14: qty 2
  Filled 2016-01-14: qty 1
  Filled 2016-01-14: qty 2

## 2016-01-14 MED ORDER — OXYCODONE-ACETAMINOPHEN 5-325 MG PO TABS
1.0000 | ORAL_TABLET | ORAL | Status: DC | PRN
  Administered 2016-01-14: 1

## 2016-01-14 MED ORDER — ACETAMINOPHEN 325 MG PO TABS: 650.0000 mg | ORAL_TABLET | Freq: Four times a day (QID) | ORAL | Status: DC | PRN

## 2016-01-14 MED ORDER — DOCUSATE SODIUM 100 MG PO CAPS: 100.0000 mg | ORAL_CAPSULE | Freq: Two times a day (BID) | ORAL | Status: DC

## 2016-01-14 MED ORDER — INSULIN GLARGINE 100 UNIT/ML ~~LOC~~ SOLN
20.0000 [IU] | Freq: Every day | SUBCUTANEOUS | Status: DC
  Filled 2016-01-14: qty 0.2

## 2016-01-14 MED ORDER — OXYCODONE-ACETAMINOPHEN 5-325 MG PO TABS: 1.0000 | ORAL_TABLET | Freq: Once | ORAL | Status: DC

## 2016-01-14 MED ORDER — SODIUM CHLORIDE 0.9 % IV SOLN
1250.0000 mg | INTRAVENOUS | Status: DC
  Administered 2016-01-15 – 2016-01-16 (×2): 1250 mg via INTRAVENOUS
  Filled 2016-01-14 (×3): qty 1250

## 2016-01-14 NOTE — Progress Notes (Signed)
VASCULAR LAB PRELIMINARY  ARTERIAL  ABI completed:    RIGHT    LEFT    PRESSURE WAVEFORM  PRESSURE WAVEFORM  BRACHIAL 148  BRACHIAL    DP  Monophasic DP  Dampened monophasic  AT   AT    PT 74 Monophasic PT  Unable to insonate due to bandage location  PER   PER    GREAT TOE 28 NA GREAT TOE  NA    RIGHT LEFT  ABI 0.50 *   Study was technically difficult due to poor patient cooperation, therefore unable to obtain right brachial waveform, and left brachial pressure and waveform. Of note: patient's heart rate was fluctuating between the 120s and 150s throughout the exam.  Bilateral dorsalis pedis arteries are noncompressible, suggestive of medial calcification.  The right ABI is suggestive of severe arterial insufficiency at rest.  The right great toe TBI is abnormal with a very dampened waveform.  Unable to calculate left ABI due to noncompressible vessel. Unable to insonate the left posterior tibial artery due to bandage location. Unable to identify a discernable left great toe waveform, therefore could not obtain pressure or calculate TBI.  01/14/2016 11:02 AM Maudry Mayhew, B.S., RVT, RDCS, RDMS

## 2016-01-14 NOTE — Progress Notes (Signed)
No acute changes. Vital signs are stable, family has been visiting throughout the day. There is some obvious animosity between the two sons, disagreement on quality of her care concerning the SNF she resides at. Answered questions on plan of care and isolation protocol. Will continue to monitor.

## 2016-01-14 NOTE — ED Provider Notes (Signed)
CSN: [id number]     Arrival date & time 01/13/16  1611 History   First MD Initiated Contact with Patient 01/13/16 1616     Chief Complaint  Patient presents with  . Claudication  . Fever     (Consider location/radiation/quality/duration/timing/severity/associated sxs/prior Treatment)  The history is provided by the nursing home and a relative. The history is limited by the condition of the patient.   Yolanda Solis is a 82 year old female with history of a facial following a CVA, hypertension, GI bleed, paraplegia, diabetes, chronic indwelling Foley catheter, chronic tachycardia with A. fib, and dementia presenting today for evaluation after the nursing staff [place] noticed a change in the color of her left foot. EMS states the report was that the foot looked "dusky" and there was concern for possible blood clot. She is unable to contribute to the history of present illness due to the above medical history. There is no further information available at this time.   Family arrived and states the foot was normal last night but became grey/blue and cold this morning. They think the color and temperature have improved significantly since that time.   Past Medical History  Diagnosis Date  . Diabetes mellitus   . Hypertension   . Arthritis    Past Surgical History  Procedure Laterality Date  . Abdominal hysterectomy    . Posterior lumbar fusion 4 level N/A 02/17/2014    Procedure: Thoracolumbar Repair;  Surgeon: Erline Levine, MD;  Location: [facility];  Service: Neurosurgery;  Laterality: N/A;  Thoracolumbar Repair T8 - T12   Family History  Problem Relation Age of Onset  . Heart failure Mother   . Heart failure Father   . Stroke Neg Hx   . Cancer Neg Hx    Social History  Substance Use Topics  . Smoking status: Never Smoker   . Smokeless tobacco: None  . Alcohol Use: No   OB History    No data available     Review of Systems  Unable to  perform ROS: Patient nonverbal      Allergies  Other  Home Medications   Prior to Admission medications   Medication Sig Start Date End Date Taking? Authorizing Provider  ALPRAZolam Duanne Moron) 0.25 MG tablet Take one tablet by mouth three times daily as needed for anixety 03/15/14  Yes Mahima Pandey, MD  Amino Acids-Protein Hydrolys (FEEDING SUPPLEMENT, PRO-STAT SUGAR FREE 64,) LIQD Take 30 mLs by mouth daily.   Yes Historical Provider, MD  calcium-vitamin D (OSCAL WITH D) 500-200 MG-UNIT tablet Take 1 tablet by mouth.   Yes Historical Provider, MD  diazepam (VALIUM) 5 MG tablet Take 0.5 tablets (2.5 mg total) by mouth every 8 (eight) hours as needed for anxiety. 02/16/14  Yes Virgel Manifold, MD  docusate sodium (COLACE) 100 MG capsule Take 1 capsule (100 mg total) by mouth every 12 (twelve) hours. 02/16/14  Yes Virgel Manifold, MD  ibuprofen (ADVIL,MOTRIN) 200 MG tablet Take 400 mg by mouth at bedtime.    Yes Historical Provider, MD  insulin glargine (LANTUS) 100 UNIT/ML injection Inject 20-45 Units into the skin 2 (two) times daily. Inject 45 units each morning and 20 units each evening.   Yes Historical Provider, MD  insulin lispro (HUMALOG) 100 UNIT/ML injection Inject 9 Units into the skin 3 (three) times daily before meals.   Yes Historical Provider, MD  lisinopril (PRINIVIL,ZESTRIL) 5 MG tablet Take 5 mg by mouth daily.   Yes Historical  Provider, MD  loratadine (ALLERGY RELIEF) 10 MG tablet Take 10 mg by mouth daily.   Yes Historical Provider, MD  metFORMIN (GLUCOPHAGE) 500 MG tablet Take 1 tablet by mouth 2 (two) times daily.   Yes Historical Provider, MD  metoprolol succinate (TOPROL-XL) 25 MG 24 hr tablet Take 1 tablet by mouth 2 (two) times daily. 11/20/15  Yes Historical Provider, MD  oxyCODONE-acetaminophen (PERCOCET/ROXICET) 5-325 MG per tablet Take 1-2 tablets by mouth every 4 (four) hours as needed for severe pain. 02/16/14  Yes Virgel Manifold, MD  Polyethylene Glycol 3350 (MIRALAX PO)  Take 17 g by mouth daily.   Yes Historical Provider, MD  vitamin C (ASCORBIC ACID) 500 MG tablet Take 500 mg by mouth daily.   Yes Historical Provider, MD  zinc sulfate 220 (50 Zn) MG capsule Take 220 mg by mouth daily.   Yes Historical Provider, MD  enoxaparin (LOVENOX) 30 MG/0.3ML injection Inject 0.3 mLs (30 mg total) into the skin every 12 (twelve) hours. 01/13/16   Allie Bossier, MD  HYDROcodone-acetaminophen (NORCO/VICODIN) 5-325 MG per tablet Take two tablets by mouth every 4 hours as needed for pain 03/15/14   Blanchie Serve, MD  sulfamethoxazole-trimethoprim (BACTRIM DS,SEPTRA DS) 800-160 MG tablet Take 1 tablet by mouth 2 (two) times daily. 01/13/16 01/20/16  Allie Bossier, MD   BP 123/67 mmHg  Pulse 113  Temp(Src) 99.3 F (37.4 C) (Rectal)  Resp 20  Wt 92.987 kg  SpO2 97% Physical Exam  Constitutional: She appears well-developed and well-nourished. She appears distressed (patient crying out, appears distressed).  HENT:  Head: Normocephalic and atraumatic.  Eyes: Conjunctivae are normal.  Cardiovascular: Normal rate, normal heart sounds and intact distal pulses.   No murmur heard. Pulmonary/Chest: Effort normal and breath sounds normal. No respiratory distress.  Abdominal: Soft. There is no tenderness.  Musculoskeletal: She exhibits no edema.  Neurological: She is alert.  Right sided paralysis. Moving left side. Nonverbal. Does not follow commands.   Skin: Skin is warm. She is not diaphoretic.  Left foot warm. Mild mottling. 2 sec cap refill. Very small amount of gray discoloration at the tip of the first toe.  Stage III ulcer on the dorsum of the left foot. Stage II ulcer on the heel.   Psychiatric: She has a normal mood and affect. Her behavior is normal.  Nursing note and vitals reviewed.   ED Course  Procedures (including critical care time) Labs Review Labs Reviewed  CBC WITH DIFFERENTIAL/PLATELET - Abnormal; Notable for the following:    WBC 14.8 (*)    RBC 3.68 (*)     Hemoglobin 10.9 (*)    HCT 35.5 (*)    Platelets 522 (*)    Neutro Abs 9.5 (*)    Monocytes Absolute 1.5 (*)    Eosinophils Absolute 1.4 (*)    All other components within normal limits  BASIC METABOLIC PANEL - Abnormal; Notable for the following:    Chloride 97 (*)    Glucose, Bld 103 (*)    BUN 43 (*)    All other components within normal limits  URINALYSIS, ROUTINE W REFLEX MICROSCOPIC (NOT AT [facility]) - Abnormal; Notable for the following:    APPearance CLOUDY (*)    pH 8.5 (*)    Nitrite POSITIVE (*)    Leukocytes, UA LARGE (*)    All other components within normal limits  URINE MICROSCOPIC-ADD ON - Abnormal; Notable for the following:    Bacteria, UA MANY (*)    All other components  within normal limits  I-STAT CG4 LACTIC ACID, ED - Abnormal; Notable for the following:    Lactic Acid, Venous 2.63 (*)    All other components within normal limits  URINE CULTURE  CULTURE, BLOOD (ROUTINE X 2)  CULTURE, BLOOD (ROUTINE X 2)  TROPONIN I  TROPONIN I  TROPONIN I  I-STAT CG4 LACTIC ACID, ED    Imaging Review No results found. I have personally reviewed and evaluated these images and lab results as part of my medical decision-making.   EKG Interpretation None      MDM   Final diagnoses:  Claudication [facility])  Urinary tract infection associated with catheterization of urinary tract, initial encounter  Atrial fibrillation with rapid ventricular response [facility])  Patient with history of severe stroke rendering her nonverbal with right-sided paralysis in early June this year presenting for color change of the left foot. It was gray blue, cold to touch, and mottled this morning, which was new since last night. On arrival the color and foot had returned and the foot was now warm. She was given a dose of Lovenox. She had a strong pulse. After conversation with family, they would prefer not to pursue invasive treatment of a clot, should another one arise. There is some familial  concern for altered mental status with alternations in her heart rate. Urinalysis was obtained showing UTI. While in the emergency department her heart rate increased to the 130s to 140s. She was started on diltiazem for A. fib with RVR and will be admitted to the stepdown unit for further care of her rapid heart rate likely secondary to sepsis from UTI. She was started on Rocephin and was given fluid resuscitation. Her blood pressure remained within normal limits. Initial lactate was greater than 2. Repeat 1.06.     Allie Bossier, MD 01/14/16 1326  Allie Bossier, MD 01/14/16 1328  Elnora Morrison, MD 01/14/16 [phone]

## 2016-01-14 NOTE — Progress Notes (Signed)
Pharmacy Antibiotic Note  Yolanda Solis is a 82 y.o. female admitted on 01/13/2016 with Left foot ischemia, possible urosepsis .  Pharmacy has been consulted for Vancomycin dosing.  Plan: Vancomycin 1500 mg IV now, then 1250 mg IV q24h  Weight: 205 lb (93 kg)  Temp (24hrs), Avg:99.4 F (37.4 C), Min:99.2 F (37.3 C), Max:99.6 F (37.6 C)   Recent Labs Lab 01/13/16 1715 01/13/16 1815 01/13/16 2308  WBC 14.8*  --   --   CREATININE 0.76  --   --   LATICACIDVEN  --  2.63* 1.07    CrCl cannot be calculated (Unknown ideal weight.).    Allergies  Allergen Reactions  . Other Rash    Bandaids     Caryl Pina 01/14/2016 5:35 AM

## 2016-01-14 NOTE — Progress Notes (Signed)
[place] for Heparin  Indication: Ischemic Limb  Allergies  Allergen Reactions  . Other Rash    Bandaids    Patient Measurements: Height: 5\' 2"  (157.5 cm) Weight: 205 lb (93 kg) IBW/kg (Calculated) : 50.1  Vital Signs: Temp: 97.4 F (36.3 C) (07/23 1609) Temp Source: Oral (07/23 1609) BP: 134/52 (07/23 1800) Pulse Rate: 84 (07/23 1800)  Labs:  Recent Labs  01/13/16 1715 01/13/16 2256 01/14/16 0530 01/14/16 0536 01/14/16 1104 01/14/16 1813  HGB 10.9*  --   --  11.3*  --   --   HCT 35.5*  --   --  37.2  --   --   PLT 522*  --   --  510*  --   --   APTT  --   --   --  36  --   --   LABPROT  --   --   --  14.6  --   --   INR  --   --   --  1.13  --   --   HEPARINUNFRC  --   --   --   --   --  0.15*  CREATININE 0.76  --   --  0.81  --   --   TROPONINI  --  <0.03 0.04*  --  0.06*  --     Estimated Creatinine Clearance: 59.8 mL/min (by C-G formula based on SCr of 0.81 mg/dL).   Assessment: Starting heparin drip for possible lower extremity ischemia. Hgb 10.9. Renal function good. Received full dose Lovenox x 1 on 7/22 around 2130- heparin started 12 hours after that dose. PTA meds reviewed.   First heparin level low at 0.15 units/mL- no issues with line per RN. No bleeding noted. CBC stable.   Goal of Therapy:  Heparin level 0.3-0.7 units/ml Monitor platelets by anticoagulation protocol: Yes   Plan:  -Bolus with 1000 units IV x1, then increase rate to 1050 units/hr -Daily CBC/HL -Monitor for bleeding -F/U vascular surgery plans  Kenady Doxtater D. Avarey Yaeger, PharmD, BCPS Clinical Pharmacist Pager: [phone] 01/14/2016 7:30 PM

## 2016-01-14 NOTE — Progress Notes (Signed)
TEAM 1 - Stepdown/ICU TEAM  Yolanda Solis  [id number] DOB: 02/09/1934 DOA: 01/13/2016 PCP: Glo Herring., MD    Brief Narrative:  82 y.o. female with history of T10 displaced fracture with cord compression since 2015 leading to complete paraplegia, CVA in June 2017 resulting a aphasia & right hemiparesis, Chronic Afib, DM, and HTN who presented from nursing home facility with a change in the color of her foot. It was found to be blue initially, but reportedly the coloration improved after the limb's position was adjusted.  In the ER she was found to be tachycardic up to 139, with evidence of a urinary tract infection, WBC 14.8, and lactic acid 2.63  Subjective: The patient is screaming out nearly constantly.  She follows examiner with her eyes but does not follow simple commands.  She does not answer any questions or communicate in any effective way.  She does not appear to be in pain but she is certainly screaming out as if she is in severe pain.  She does not appear to be suffering any acute respiratory distress.  Assessment & Plan:  Sepsis due to UTI Continue empiric antibiotics - follow-up urine culture - sepsis physiology improving  Ischemia of left foot At present the patient's foot does not appear ischemic - it is warm to touch though slightly discolored compared to the right foot - I'm not able to appreciate pulses on either foot on palpation - there is no evidence of worse pain when I examined the foot  Chronic atrial fibrillation with acute RVR Heart rate better controlled on Cardizem drip - continue for now - anticoagulated with IV heparin for now - does not appear to be on chronic anticoagulation at this time though outpatient notes from [place] suggest at 1. she was prescribed a NOAC  T10 fracture with complete paraplegia after a fall - Neurogenic bowel and bladder  Hypertension Blood pressure not ideal but likely exacerbated by pain - adjust pain regimen  and follow  Normocytic anemia Likely anemia of chronic disease with smoldering lower extremity wounds as well as poor nutrition - no evidence of acute blood loss - follow hemoglobin trend  DM2 A1c pending - follow CBG and utilize sliding scale insulin   MRSA screen +  DVT prophylaxis: Lovenox Code Status: No CODE BLUE Family Communication: no family present at time of exam  Disposition Plan: SDU   Consultants:  none  Procedures: B LE ABIs - 7/23 - unable to obtain useful information of L LE - severe arterial insuff noted on R LE    Antimicrobials:  Ceftriaxone 7/22 > Vancomycin 7/22 >  Objective: Blood pressure (!) 136/55, pulse (!) 115, temperature 97.9 F (36.6 C), temperature source Oral, resp. rate 17, height 5\' 2"  (1.575 m), weight 93 kg (205 lb), SpO2 100 %.  Intake/Output Summary (Last 24 hours) at 01/14/16 1142 Last data filed at 01/14/16 0541  Gross per 24 hour  Intake                0 ml  Output             1525 ml  Net            -1525 ml   Filed Weights   01/13/16 1614  Weight: 93 kg (205 lb)    Examination: General: No acute respiratory distress evident - screaming out constantly as if in pain Lungs: Clear to auscultation bilaterally without wheezes or crackles Cardiovascular: Irregularly irregular without appreciable  murmur or gallop  Abdomen: Nondistended, overweight, soft, bowel sounds positive, no rebound, no ascites, no appreciable mass Extremities: No significant edema bilateral lower extremities - no palpable pulses right or left foot - left foot pink in color but slightly more pink than the right foot - left foot warm to touch   CBC:  Recent Labs Lab 01/13/16 1715 01/14/16 0536  WBC 14.8* 15.2*  NEUTROABS 9.5*  --   HGB 10.9* 11.3*  HCT 35.5* 37.2  MCV 96.5 96.1  PLT 522* [id number]*   Basic Metabolic Panel:  Recent Labs Lab 01/13/16 1715 01/14/16 0536  NA 137 138  K 4.5 4.0  CL 97* 103  CO2 30 27  GLUCOSE 103* 154*  BUN 43* 32*    CREATININE 0.76 0.81  CALCIUM 9.4 8.7*  MG  --  1.9  PHOS  --  3.7   GFR: Estimated Creatinine Clearance: 59.8 mL/min (by C-G formula based on SCr of 0.81 mg/dL).  Liver Function Tests:  Recent Labs Lab 01/14/16 0536  AST 23  ALT 10*  ALKPHOS 60  BILITOT 0.6  PROT 6.4*  ALBUMIN 2.3*    Coagulation Profile:  Recent Labs Lab 01/14/16 0536  INR 1.13    Cardiac Enzymes:  Recent Labs Lab 01/13/16 2256 01/14/16 0530  TROPONINI <0.03 0.04*    HbA1C: Hgb A1c MFr Bld  Date/Time Value Ref Range Status  02/19/2014 02:30 AM 7.9 (H) <5.7 % Final    Comment:    (NOTE)                                                                       According to the ADA Clinical Practice Recommendations for 2011, when HbA1c is used as a screening test:  >=6.5%   Diagnostic of Diabetes Mellitus           (if abnormal result is confirmed) 5.7-6.4%   Increased risk of developing Diabetes Mellitus References:Diagnosis and Classification of Diabetes Mellitus,Diabetes [id number] 1):S62-S69 and Standards of Medical Care in         Diabetes - 2011,Diabetes [id number] (Suppl 1):S11-S61.    CBG:  Recent Labs Lab 01/14/16 0915  GLUCAP 143*    Recent Results (from the past 240 hour(s))  MRSA PCR Screening     Status: Abnormal   Collection Time: 01/14/16  6:51 AM  Result Value Ref Range Status   MRSA by PCR POSITIVE (A) NEGATIVE Final    Comment:        The GeneXpert MRSA Assay (FDA approved for NASAL specimens only), is one component of a comprehensive MRSA colonization surveillance program. It is not intended to diagnose MRSA infection nor to guide or monitor treatment for MRSA infections. RESULT CALLED TO, READ BACK BY AND VERIFIED WITH:  S. ADAMS 1055 07.23.17 N. MORRIS      Scheduled Meds: . cefTRIAXone (ROCEPHIN)  IV  1 g Intravenous Q24H  . docusate sodium  100 mg Oral Q12H  . insulin aspart  0-9 Units Subcutaneous Q4H  . insulin glargine  20 Units  Subcutaneous QHS  . insulin glargine  45 Units Subcutaneous Daily  . sodium chloride  1,000 mL Intravenous Once  . sodium chloride flush  3 mL Intravenous Q12H  . [START ON  01/15/2016] vancomycin  1,250 mg Intravenous Q24H   Continuous Infusions: . sodium chloride 100 mL/hr at 01/14/16 0900  . diltiazem (CARDIZEM) infusion 5 mg/hr (01/14/16 0857)  . heparin 900 Units/hr (01/14/16 0900)     LOS: 0 days    Cherene Altes, MD [facility] Office  [phone] Pager - Text Page per Amion as per below:  On-Call/Text Page:      [email or web address]      password [email or web address]  If 7PM-7AM, please contact night-coverage [email or web address] Password [email or web address] 01/14/2016, 11:42 AM

## 2016-01-14 NOTE — Progress Notes (Signed)
ANTICOAGULATION CONSULT NOTE - Initial Consult  Pharmacy Consult for Heparin  Indication: Ischemic Limb  Allergies  Allergen Reactions  . Other Rash    Bandaids    Patient Measurements: Weight: 205 lb (93 kg)  Vital Signs: Temp: 99.3 F (37.4 C) (07/22 2323) Temp Source: Rectal (07/22 2323) BP: 123/67 (07/22 2330) Pulse Rate: 113 (07/22 2330)  Labs:  Recent Labs  01/13/16 1715 01/13/16 2256  HGB 10.9*  --   HCT 35.5*  --   PLT 522*  --   CREATININE 0.76  --   TROPONINI  --  <0.03    CrCl cannot be calculated (Unknown ideal weight.).   Medical History: Past Medical History:  Diagnosis Date  . Arthritis   . Diabetes mellitus   . Hypertension     Assessment: Starting heparin drip for possible lower extremity ischemia. Hgb 10.9. Renal function good. Received full dose Lovenox x 1 on 7/22 around 2130. Will start heparin 12 hours after that dose. PTA meds reviewed.   Goal of Therapy:  Heparin level 0.3-0.7 units/ml Monitor platelets by anticoagulation protocol: Yes   Plan:  -Start heparin at 900 units/hr at 0930 -1800 HL -Daily CBC/HL -Monitor for bleeding- -F/U vascular surgery plans  Narda Bonds 01/14/2016,5:07 AM

## 2016-01-15 DIAGNOSIS — L8990 Pressure ulcer of unspecified site, unspecified stage: Secondary | ICD-10-CM

## 2016-01-15 LAB — COMPREHENSIVE METABOLIC PANEL
ALK PHOS: 56 U/L (ref 38–126)
ALT: 8 U/L — AB (ref 14–54)
AST: 24 U/L (ref 15–41)
Albumin: 2.1 g/dL — ABNORMAL LOW (ref 3.5–5.0)
Anion gap: 7 (ref 5–15)
BUN: 19 mg/dL (ref 6–20)
CALCIUM: 8.2 mg/dL — AB (ref 8.9–10.3)
CHLORIDE: 108 mmol/L (ref 101–111)
CO2: 25 mmol/L (ref 22–32)
CREATININE: 0.65 mg/dL (ref 0.44–1.00)
GFR calc Af Amer: >60 mL/min (ref >60)
Glucose, Bld: 82 mg/dL (ref 65–99)
Potassium: 3.7 mmol/L (ref 3.5–5.1)
SODIUM: 140 mmol/L (ref 135–145)
Total Bilirubin: 0.6 mg/dL (ref 0.3–1.2)
Total Protein: 5.9 g/dL — ABNORMAL LOW (ref 6.5–8.1)

## 2016-01-15 LAB — CBC
HCT: 32.7 % — ABNORMAL LOW (ref 36.0–46.0)
Hemoglobin: 9.9 g/dL — ABNORMAL LOW (ref 12.0–15.0)
MCH: 29.1 pg (ref 26.0–34.0)
MCHC: 30.3 g/dL (ref 30.0–36.0)
MCV: 96.2 fL (ref 78.0–100.0)
Platelets: 503 10*3/uL — ABNORMAL HIGH (ref 150–400)
RBC: 3.4 MIL/uL — ABNORMAL LOW (ref 3.87–5.11)
RDW: 14.8 % (ref 11.5–15.5)
WBC: 15.3 10*3/uL — ABNORMAL HIGH (ref 4.0–10.5)

## 2016-01-15 LAB — GLUCOSE, CAPILLARY
GLUCOSE-CAPILLARY: 78 mg/dL (ref 65–99)
GLUCOSE-CAPILLARY: 83 mg/dL (ref 65–99)
GLUCOSE-CAPILLARY: 88 mg/dL (ref 65–99)
Glucose-Capillary: 114 mg/dL — ABNORMAL HIGH (ref 65–99)
Glucose-Capillary: 108 mg/dL — ABNORMAL HIGH (ref 65–99)
Glucose-Capillary: 98 mg/dL (ref 65–99)

## 2016-01-15 LAB — HEPARIN LEVEL (UNFRACTIONATED)
HEPARIN UNFRACTIONATED: 0.27 [IU]/mL — AB (ref 0.30–0.70)
HEPARIN UNFRACTIONATED: 0.35 [IU]/mL (ref 0.30–0.70)

## 2016-01-15 LAB — HEMOGLOBIN A1C
Hgb A1c MFr Bld: 9.9 % — ABNORMAL HIGH (ref 4.8–5.6)
Mean Plasma Glucose: 237 mg/dL

## 2016-01-15 MED ORDER — MUPIROCIN 2 % EX OINT
1.0000 "application " | TOPICAL_OINTMENT | Freq: Two times a day (BID) | CUTANEOUS | Status: DC
  Administered 2016-01-15 – 2016-01-18 (×7): 1 via NASAL
  Filled 2016-01-15: qty 22

## 2016-01-15 MED ORDER — CHLORHEXIDINE GLUCONATE CLOTH 2 % EX PADS
6.0000 | MEDICATED_PAD | Freq: Every day | CUTANEOUS | Status: DC
  Administered 2016-01-15 – 2016-01-18 (×4): 6 via TOPICAL

## 2016-01-15 NOTE — Progress Notes (Signed)
[place] for Heparin  Indication: Ischemic Limb  Allergies  Allergen Reactions  . Other Rash    Bandaids    Patient Measurements: Height: 5\' 2"  (157.5 cm) Weight: 205 lb (93 kg) IBW/kg (Calculated) : 50.1  HDW: ~72kg  Vital Signs: Temp: 99.9 F (37.7 C) (07/24 0744) Temp Source: Axillary (07/24 0744) BP: 136/51 (07/24 0744) Pulse Rate: 83 (07/24 0744)  Labs:  Recent Labs  01/13/16 1715 01/13/16 2256 01/14/16 0530 01/14/16 0536 01/14/16 1104 01/14/16 1813 01/15/16 0234 01/15/16 0915  HGB 10.9*  --   --  11.3*  --   --  9.9*  --   HCT 35.5*  --   --  37.2  --   --  32.7*  --   PLT 522*  --   --  510*  --   --  503*  --   APTT  --   --   --  36  --   --   --   --   LABPROT  --   --   --  14.6  --   --   --   --   INR  --   --   --  1.13  --   --   --   --   HEPARINUNFRC  --   --   --   --   --  0.15*  --  0.27*  CREATININE 0.76  --   --  0.81  --   --  0.65  --   TROPONINI  --  <0.03 0.04*  --  0.06*  --   --   --     Estimated Creatinine Clearance: 60.6 mL/min (by C-G formula based on SCr of 0.8 mg/dL).   Assessment: 67 yom continuing on heparin drip for possible lower extremity ischemia. Hx of afib not on AC pta at SNF (appears possibly on NOAC at one point).   HL 0.27 on 1050 units/h. Hg down 9.9, plt stable 503, no bleed/iv line issues per RN  Goal of Therapy:  Heparin level 0.3-0.7 units/ml Monitor platelets by anticoagulation protocol: Yes   Plan:  -Increase heparin to 1200 units/h -8h HL, Daily CBC/HL -Monitor for bleeding -F/U vascular surgery plans  Elicia Lamp, PharmD, [facility] Clinical Pharmacist Pager [phone] 01/15/2016 10:24 AM

## 2016-01-15 NOTE — Progress Notes (Signed)
[place] for Heparin  Indication: Ischemic Limb  Allergies  Allergen Reactions  . Other Rash    Bandaids    Patient Measurements: Height: 5\' 2"  (157.5 cm) Weight: 205 lb (93 kg) IBW/kg (Calculated) : 50.1  HDW: ~72kg  Vital Signs: Temp: 98.3 F (36.8 C) (07/24 1525) Temp Source: Axillary (07/24 1525) BP: 119/54 (07/24 1600) Pulse Rate: 88 (07/24 1600)  Labs:  Recent Labs  01/13/16 1715 01/13/16 2256 01/14/16 0530 01/14/16 0536 01/14/16 1104 01/14/16 1813 01/15/16 0234 01/15/16 0915 01/15/16 1841  HGB 10.9*  --   --  11.3*  --   --  9.9*  --   --   HCT 35.5*  --   --  37.2  --   --  32.7*  --   --   PLT 522*  --   --  510*  --   --  503*  --   --   APTT  --   --   --  36  --   --   --   --   --   LABPROT  --   --   --  14.6  --   --   --   --   --   INR  --   --   --  1.13  --   --   --   --   --   HEPARINUNFRC  --   --   --   --   --  0.15*  --  0.27* 0.35  CREATININE 0.76  --   --  0.81  --   --  0.65  --   --   TROPONINI  --  <0.03 0.04*  --  0.06*  --   --   --   --     Estimated Creatinine Clearance: 60.6 mL/min (by C-G formula based on SCr of 0.8 mg/dL).   Assessment: 30 yom continuing on heparin drip for possible lower extremity ischemia. Hx of afib not on AC pta at SNF (appears possibly on NOAC at one point).   HL 0.27 on 1050 units/h. Hg down 9.9, plt stable 503, no bleed/iv line issues per RN  Repeat HL is now therapeutic at 0.35 on heparin 1200 units/hr. No issues with infusion or bleeding noted.   Goal of Therapy:  Heparin level 0.3-0.7 units/ml Monitor platelets by anticoagulation protocol: Yes   Plan:  Continue heparin 1200 units/hr Daily CBC/HL Monitor for bleeding F/U vascular surgery plans  Andrey Cota. Diona Foley, PharmD, [place] Clinical Pharmacist Pager [phone] 01/15/2016 7:12 PM

## 2016-01-15 NOTE — Progress Notes (Signed)
[place] TEAM 1 - Stepdown/ICU TEAM  DELESA GREIFF  [id number] DOB: 1933-12-02 DOA: 01/13/2016 PCP: Glo Herring., MD    Brief Narrative:  82 y.o. 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 It was found to be blue initially, but reportedly the coloration improved after the limb's position was adjusted.  In the ER she was found to be tachycardic up to 139, with evidence of a urinary tract infection, WBC 14.8, and lactic acid 2.63  Subjective: The patient is calm and appears comfortable at the time of visit today.  She is no longer calling out constantly as she was yesterday.  There is no family in the room and time of my visit.  Patient does not awaken to my exam.  She is in no apparent respiratory distress nor does she appear to be in pain.  Assessment & Plan:  Sepsis due to UTI Continue empiric antibiotics - urine culture has not proven to be helpful - sepsis physiology improving - plan to complete 7 days of empiric therapy  Ischemia of left foot The patient's L great toe is evolving - it is somewhat purplish today and colder to touch I suspect she may very well have infarcted at least this toe - the remainder of the foot is warm to touch though slightly discolored compared to the right foot - I'm not able to appreciate pulses on either foot on palpation   Chronic atrial fibrillation with acute RVR Heart rate  well controlled on Cardizem drip - continue for now as patient not alert enough for oral intake  - anticoagulated with IV heparin- does not appear to be on chronic anticoagulation at this time though outpatient notes from [place] suggest at one point she was prescribed a NOAC  T10 fracture with complete paraplegia after a fall - Neurogenic  bowel and bladder  Hypertension Blood pressure well controlled at present   Normocytic anemia Likely anemia of chronic disease with smoldering lower extremity wounds as well as poor nutrition - no evidence of acute blood loss -  continue to follow hemoglobin trend  DM2 A1c 9.9 -  CBG currently well-controlled   MRSA screen +  Goals of care  the patient's day-to-day quality of life is not clear to me - what I have seen over the last 24-48 hours however is a very low quality of life with significant altered sensorium - I will ask palliative care to assist [place] in goals of care planning as I'm informed that her 2 most involved sons are apparently at odds with one another in regards to appropriate care going forward  DVT prophylaxis: IV heparin  Code Status: No CODE BLUE Family Communication: no family present at time of exam  Disposition Plan: SDU   Consultants:  none  Procedures: B LE ABIs - 7/23 - unable to obtain useful information of L LE - severe arterial insuff noted on R LE    Antimicrobials:  Ceftriaxone 7/22 > Vancomycin 7/22 >  Objective: Blood pressure (!) 136/51, pulse 83, temperature 99.9 F (37.7 C), temperature source Axillary, resp. rate 18, height 5\' 2"  (1.575 m), weight 93 kg (205 lb), SpO2 99 %.  Intake/Output Summary (Last 24 hours) at 01/15/16 1431 Last data filed at 01/15/16 [id number]  Gross per  24 hour  Intake          1899.03 ml  Output             1550 ml  Net           349.03 ml   Filed Weights   01/13/16 1614  Weight: 93 kg (205 lb)    Examination: General: No acute respiratory distress evident - Somnolent  Lungs: poor air movement bilateral bases but no wheezing and good air movement throughout other fields  Cardiovascular: Irregularly irregular without murmur or gallop  Abdomen: Nondistended, overweight, soft, bowel sounds positive, no rebound, no ascites, no appreciable mass Extremities: No significant edema bilateral lower extremities - no  palpable pulses right or left foot - left foot pink in color but slightly more pink than the right foot -  left great toe now somewhat colder to touch and slightly bluish in discoloration - left foot warm to touch  overall   CBC:  Recent Labs Lab 01/13/16 1715 01/14/16 0536 01/15/16 0234  WBC 14.8* 15.2* 15.3*  NEUTROABS 9.5*  --   --   HGB 10.9* 11.3* 9.9*  HCT 35.5* 37.2 32.7*  MCV 96.5 96.1 96.2  PLT 522* 510* [id number]*   Basic Metabolic Panel:  Recent Labs Lab 01/13/16 1715 01/14/16 0536 01/15/16 0234  NA 137 138 140  K 4.5 4.0 3.7  CL 97* 103 108  CO2 30 27 25   GLUCOSE 103* 154* 82  BUN 43* 32* 19  CREATININE 0.76 0.81 0.65  CALCIUM 9.4 8.7* 8.2*  MG  --  1.9  --   PHOS  --  3.7  --    GFR: Estimated Creatinine Clearance: 60.6 mL/min (by C-G formula based on SCr of 0.8 mg/dL).  Liver Function Tests:  Recent Labs Lab 01/14/16 0536 01/15/16 0234  AST 23 24  ALT 10* 8*  ALKPHOS 60 56  BILITOT 0.6 0.6  PROT 6.4* 5.9*  ALBUMIN 2.3* 2.1*    Coagulation Profile:  Recent Labs Lab 01/14/16 0536  INR 1.13    Cardiac Enzymes:  Recent Labs Lab 01/13/16 2256 01/14/16 0530 01/14/16 1104  TROPONINI <0.03 0.04* 0.06*    HbA1C: Hgb A1c MFr Bld  Date/Time Value Ref Range Status  01/14/2016 05:36 AM 9.9 (H) 4.8 - 5.6 % Final    Comment:    (NOTE)         Pre-diabetes: 5.7 - 6.4         Diabetes: >6.4         Glycemic control for adults with diabetes: <7.0   02/19/2014 02:30 AM 7.9 (H) <5.7 % Final    Comment:    (NOTE)                                                                       According to the ADA Clinical Practice Recommendations for 2011, when HbA1c is used as a screening test:  >=6.5%   Diagnostic of Diabetes Mellitus           (if abnormal result is confirmed) 5.7-6.4%   Increased risk of developing Diabetes Mellitus References:Diagnosis and Classification of Diabetes Mellitus,Diabetes [id number] 1):S62-S69 and Standards of  Medical Care in  Diabetes - 2011,Diabetes Care,2011,34 (Suppl 1):S11-S61.    CBG:  Recent Labs Lab 01/14/16 1955 01/14/16 2348 01/15/16 0245 01/15/16 0749 01/15/16 1154  GLUCAP 85 83 88 78 114*    Recent Results (from the past 240 hour(s))  Blood culture (routine x 2)     Status: None (Preliminary result)   Collection Time: 01/13/16  5:15 PM  Result Value Ref Range Status   Specimen Description BLOOD LEFT HAND  Final   Special Requests BOTTLES DRAWN AEROBIC AND ANAEROBIC 5CC EA  Final   Culture NO GROWTH 2 DAYS  Final   Report Status PENDING  Incomplete  Urine culture     Status: Abnormal   Collection Time: 01/13/16  6:59 PM  Result Value Ref Range Status   Specimen Description URINE, CATHETERIZED  Final   Special Requests NONE  Final   Culture MULTIPLE SPECIES PRESENT, SUGGEST RECOLLECTION (A)  Final   Report Status 01/14/2016 FINAL  Final  Blood culture (routine x 2)     Status: None (Preliminary result)   Collection Time: 01/13/16 10:56 PM  Result Value Ref Range Status   Specimen Description BLOOD RIGHT HAND  Final   Special Requests IN PEDIATRIC BOTTLE 5CC  Final   Culture NO GROWTH 2 DAYS  Final   Report Status PENDING  Incomplete  MRSA PCR Screening     Status: Abnormal   Collection Time: 01/14/16  6:51 AM  Result Value Ref Range Status   MRSA by PCR POSITIVE (A) NEGATIVE Final    Comment:        The GeneXpert MRSA Assay (FDA approved for NASAL specimens only), is one component of a comprehensive MRSA colonization surveillance program. It is not intended to diagnose MRSA infection nor to guide or monitor treatment for MRSA infections. RESULT CALLED TO, READ BACK BY AND VERIFIED WITH:  S. ADAMS 1055 07.23.17 N. MORRIS      Scheduled Meds: . cefTRIAXone (ROCEPHIN)  IV  1 g Intravenous Q24H  . Chlorhexidine Gluconate Cloth  6 each Topical Q0600  . insulin aspart  0-9 Units Subcutaneous Q4H  . insulin glargine  10 Units Subcutaneous QHS  .  mupirocin ointment  1 application Nasal BID  . sodium chloride flush  3 mL Intravenous Q12H  . vancomycin  1,250 mg Intravenous Q24H   Continuous Infusions: . sodium chloride 1,000 mL (01/15/16 0247)  . diltiazem (CARDIZEM) infusion 5 mg/hr (01/15/16 0246)  . heparin 1,200 Units/hr (01/15/16 1047)     LOS: 1 day    Cherene Altes, MD [facility] Office  [phone] Pager - Text Page per Amion as per below:  On-Call/Text Page:      [email or web address]      password [email or web address]  If 7PM-7AM, please contact night-coverage [email or web address] Password [facility] 01/15/2016, 2:31 PM

## 2016-01-16 LAB — CBC
HCT: 36.8 % (ref 36.0–46.0)
Hemoglobin: 11.3 g/dL — ABNORMAL LOW (ref 12.0–15.0)
MCH: 29.7 pg (ref 26.0–34.0)
MCHC: 30.7 g/dL (ref 30.0–36.0)
MCV: 96.8 fL (ref 78.0–100.0)
PLATELETS: 569 10*3/uL — AB (ref 150–400)
RBC: 3.8 MIL/uL — ABNORMAL LOW (ref 3.87–5.11)
RDW: 14.7 % (ref 11.5–15.5)
WBC: 14.8 10*3/uL — AB (ref 4.0–10.5)

## 2016-01-16 LAB — RENAL FUNCTION PANEL
ALBUMIN: 2.1 g/dL — AB (ref 3.5–5.0)
Anion gap: 10 (ref 5–15)
BUN: 10 mg/dL (ref 6–20)
CALCIUM: 8.3 mg/dL — AB (ref 8.9–10.3)
CO2: 23 mmol/L (ref 22–32)
CREATININE: 0.6 mg/dL (ref 0.44–1.00)
Chloride: 105 mmol/L (ref 101–111)
GFR calc Af Amer: >60 mL/min (ref >60)
GLUCOSE: 97 mg/dL (ref 65–99)
PHOSPHORUS: 3.1 mg/dL (ref 2.5–4.6)
POTASSIUM: 3.9 mmol/L (ref 3.5–5.1)
SODIUM: 138 mmol/L (ref 135–145)

## 2016-01-16 LAB — GLUCOSE, CAPILLARY
GLUCOSE-CAPILLARY: 104 mg/dL — AB (ref 65–99)
GLUCOSE-CAPILLARY: 105 mg/dL — AB (ref 65–99)
GLUCOSE-CAPILLARY: 107 mg/dL — AB (ref 65–99)
GLUCOSE-CAPILLARY: 108 mg/dL — AB (ref 65–99)
GLUCOSE-CAPILLARY: 128 mg/dL — AB (ref 65–99)
Glucose-Capillary: 113 mg/dL — ABNORMAL HIGH (ref 65–99)
Glucose-Capillary: 114 mg/dL — ABNORMAL HIGH (ref 65–99)

## 2016-01-16 LAB — URINE CULTURE: SPECIAL REQUESTS: NORMAL

## 2016-01-16 LAB — HEPARIN LEVEL (UNFRACTIONATED)
HEPARIN UNFRACTIONATED: 0.24 [IU]/mL — AB (ref 0.30–0.70)
HEPARIN UNFRACTIONATED: 0.3 [IU]/mL (ref 0.30–0.70)

## 2016-01-16 MED ORDER — ACETAMINOPHEN 325 MG PO TABS: 650.0000 mg | ORAL_TABLET | Freq: Four times a day (QID) | ORAL | Status: DC | PRN

## 2016-01-16 MED ORDER — JEVITY 1.2 CAL PO LIQD
1000.0000 mL | ORAL | Status: DC
  Administered 2016-01-16: 1000 mL
  Filled 2016-01-16: qty 1000

## 2016-01-16 MED ORDER — ALPRAZOLAM 0.25 MG PO TABS
0.2500 mg | ORAL_TABLET | Freq: Three times a day (TID) | ORAL | Status: DC | PRN
  Administered 2016-01-16: 0.25 mg
  Filled 2016-01-16: qty 1

## 2016-01-16 MED ORDER — DILTIAZEM 12 MG/ML ORAL SUSPENSION: 60.0000 mg | Freq: Four times a day (QID) | ORAL | Status: DC

## 2016-01-16 MED ORDER — DILTIAZEM 12 MG/ML ORAL SUSPENSION
30.0000 mg | Freq: Four times a day (QID) | ORAL | Status: DC
  Administered 2016-01-16 – 2016-01-18 (×8): 30 mg
  Filled 2016-01-16 (×9): qty 3

## 2016-01-16 MED ORDER — FREE WATER
100.0000 mL | Freq: Three times a day (TID) | Status: DC
  Administered 2016-01-16 – 2016-01-18 (×7): 100 mL

## 2016-01-16 MED ORDER — ACETAMINOPHEN 650 MG RE SUPP: 650.0000 mg | Freq: Four times a day (QID) | RECTAL | Status: DC | PRN

## 2016-01-16 MED ORDER — HYDROCODONE-ACETAMINOPHEN 5-325 MG PO TABS
1.0000 | ORAL_TABLET | ORAL | Status: DC | PRN
  Administered 2016-01-16 – 2016-01-18 (×7): 2
  Filled 2016-01-16 (×7): qty 2

## 2016-01-16 MED ORDER — ENOXAPARIN SODIUM 100 MG/ML ~~LOC~~ SOLN
1.0000 mg/kg | Freq: Two times a day (BID) | SUBCUTANEOUS | Status: DC
  Administered 2016-01-16 – 2016-01-18 (×4): 95 mg via SUBCUTANEOUS
  Filled 2016-01-16 (×4): qty 1

## 2016-01-16 NOTE — Progress Notes (Signed)
Spoke with Dr Thereasa Solo in reference to family (dtr n law) and son asking for update, when pt may be ready for discharge, when tube feedings will start, and dtr n law states pt takes zanaflex and oxycodone at the facility for muscle spasms and pain (via PEG tube). MD will try to reach family.

## 2016-01-16 NOTE — Progress Notes (Signed)
[place] for Heparin  Indication: Ischemic Limb  Allergies  Allergen Reactions  . Other Rash    Bandaids    Patient Measurements: Height: 5\' 2"  (157.5 cm) Weight: 205 lb (93 kg) IBW/kg (Calculated) : 50.1  HDW: ~72kg  Vital Signs: Temp: 98.2 F (36.8 C) (07/25 1530) Temp Source: Axillary (07/25 1530) BP: 113/62 (07/25 1530)  Labs:  Recent Labs  01/13/16 2256 01/14/16 0530  01/14/16 0536 01/14/16 1104  01/15/16 0234  01/15/16 1841 01/16/16 0219 01/16/16 1044  HGB  --   --   < > 11.3*  --   --  9.9*  --   --  11.3*  --   HCT  --   --   --  37.2  --   --  32.7*  --   --  36.8  --   PLT  --   --   --  510*  --   --  503*  --   --  569*  --   APTT  --   --   --  36  --   --   --   --   --   --   --   LABPROT  --   --   --  14.6  --   --   --   --   --   --   --   INR  --   --   --  1.13  --   --   --   --   --   --   --   HEPARINUNFRC  --   --   --   --   --   < >  --   < > 0.35 0.24* 0.30  CREATININE  --   --   --  0.81  --   --  0.65  --   --  0.60  --   TROPONINI <0.03 0.04*  --   --  0.06*  --   --   --   --   --   --   < > = values in this interval not displayed.  Estimated Creatinine Clearance: 60.6 mL/min (by C-G formula based on SCr of 0.8 mg/dL).   Assessment: 51 yom on heparin drip for possible lower extremity ischemia. Hx of afib not on AC pta at SNF (appears possibly on NOAC at one point).   Orders to change to sq lovenox this afternoon. Instructed nurse to stop heparin now and will give lovenox 30 minutes later.   Goal of Therapy:  Heparin level 0.3-0.7 units/ml Anti-Xa level 0.6-1 units/ml 4hrs after LMWH dose given  Monitor platelets by anticoagulation protocol: Yes   Plan:  Stop heparin Lovenox 95mg  sq q 12 hours Check cbc on 7/27 then q72 hours  Erin Hearing PharmD., BCPS Clinical Pharmacist Pager [phone] 01/16/2016 5:38 PM

## 2016-01-16 NOTE — Progress Notes (Signed)
[place] TEAM 1 - Stepdown/ICU TEAM  Yolanda Solis  [id number] DOB: Oct 30, 1933 DOA: 01/13/2016 PCP: Glo Herring., MD    Brief Narrative:  82 y.o. 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 It was found to be blue initially, but reportedly the coloration improved after the limb's position was adjusted.  In the ER she was found to be tachycardic up to 139, with evidence of a urinary tract infection, WBC 14.8, and lactic acid 2.63  Subjective: The pt is non-communicative.  She does not appear to be in acute distress, nor does she appear uncomfortable.  There is no family present.    Assessment & Plan:  Sepsis due to UTI Continue empiric antibiotics - urine culture has not proven to be helpful - sepsis physiology improving - plan to complete 7 days of empiric therapy  Ischemia of left foot The patient's L great toe is w/o signif change today - it remains mildly purplish today and colder to touch than the R great toe -  I suspect she may very well have infarcted at least this toe - the remainder of the foot is warm to touch though slightly discolored compared to the right foot - I'm not able to appreciate pulses on either foot on palpation   Chronic atrial fibrillation with acute RVR Heart rate  well controlled on Cardizem drip - transition to per tube meds today  - anticoagulated with IV heparin - does not appear to be on chronic anticoagulation at this time though outpatient notes from [place] suggest at one point she was prescribed a NOAC - will transition to lovenox for now, until further discussion can be had w/ family in regards to goals of care   T10 fracture with complete paraplegia after a fall - Neurogenic bowel and bladder Cont supportive  care  Hypertension Blood pressure well controlled at present   Normocytic anemia Likely anemia of chronic disease with smoldering lower extremity wounds as well as poor nutrition - no evidence of acute blood loss -  Hgb has been stable   DM2 A1c 9.9 -  CBG currently well-controlled   MRSA screen +  Goals of care  the patient's day-to-day quality of life is not clear to me - what I have seen over the last 24-48 hours however is a very low quality of life with significant altered sensorium - I have asked Palliative Care to assist [place] in goals of care planning - I'm informed that her 2 most involved sons are apparently at odds with one another in regards to appropriate care going forward  DVT prophylaxis: lovenox  Code Status: No CODE BLUE Family Communication: no family present at time of exam  Disposition Plan: SDU until off cardizem gtt - resume tube feeds - ?d/c back to SNF in next 48hrs   Consultants:  Palliative Care   Procedures: B LE ABIs - 7/23 - unable to obtain useful information of L LE - severe arterial insuff noted on R LE    Antimicrobials:  Ceftriaxone 7/22 > Vancomycin 7/22 > 7/25  Objective: Blood pressure 113/62, pulse 89, temperature 98.2 F (36.8 C), temperature source Axillary, resp. rate 12, height 5\' 2"  (1.575 m), weight 93 kg (205 lb), SpO2 100 %.  Intake/Output Summary (Last 24  hours) at 01/16/16 1635 Last data filed at 01/16/16 0600  Gross per 24 hour  Intake           665.55 ml  Output             1950 ml  Net         -1284.45 ml   Filed Weights   01/13/16 1614  Weight: 93 kg (205 lb)    Examination: General: No acute respiratory distress - non-communicative  Lungs: poor air movement bilateral bases - no wheezing  Cardiovascular: Irregularly irregular without murmur  Abdomen: Nondistended, overweight, soft, bowel sounds positive, no rebound, no ascites, PEG insertion site clean and dry  Extremities: No significant edema bilateral lower  extremities - no palpable pulse right or left foot - left foot pink in color but slightly more pink than the right foot -  left great toe colder to touch and slightly bluish in discoloration w/o change since yesterday - left foot warm to touch  overall   CBC:  Recent Labs Lab 01/13/16 1715 01/14/16 0536 01/15/16 0234 01/16/16 0219  WBC 14.8* 15.2* 15.3* 14.8*  NEUTROABS 9.5*  --   --   --   HGB 10.9* 11.3* 9.9* 11.3*  HCT 35.5* 37.2 32.7* 36.8  MCV 96.5 96.1 96.2 96.8  PLT 522* 510* 503* [id number]*   Basic Metabolic Panel:  Recent Labs Lab 01/13/16 1715 01/14/16 0536 01/15/16 0234 01/16/16 0219  NA 137 138 140 138  K 4.5 4.0 3.7 3.9  CL 97* 103 108 105  CO2 30 27 25 23   GLUCOSE 103* 154* 82 97  BUN 43* 32* 19 10  CREATININE 0.76 0.81 0.65 0.60  CALCIUM 9.4 8.7* 8.2* 8.3*  MG  --  1.9  --   --   PHOS  --  3.7  --  3.1   GFR: Estimated Creatinine Clearance: 60.6 mL/min (by C-G formula based on SCr of 0.8 mg/dL).  Liver Function Tests:  Recent Labs Lab 01/14/16 0536 01/15/16 0234 01/16/16 0219  AST 23 24  --   ALT 10* 8*  --   ALKPHOS 60 56  --   BILITOT 0.6 0.6  --   PROT 6.4* 5.9*  --   ALBUMIN 2.3* 2.1* 2.1*    Coagulation Profile:  Recent Labs Lab 01/14/16 0536  INR 1.13    Cardiac Enzymes:  Recent Labs Lab 01/13/16 2256 01/14/16 0530 01/14/16 1104  TROPONINI <0.03 0.04* 0.06*    HbA1C: Hgb A1c MFr Bld  Date/Time Value Ref Range Status  01/14/2016 05:36 AM 9.9 (H) 4.8 - 5.6 % Final    Comment:    (NOTE)         Pre-diabetes: 5.7 - 6.4         Diabetes: >6.4         Glycemic control for adults with diabetes: <7.0   02/19/2014 02:30 AM 7.9 (H) <5.7 % Final    Comment:    (NOTE)                                                                       According to the ADA Clinical Practice Recommendations for 2011, when HbA1c is used as a screening test:  >=6.5%  Diagnostic of Diabetes Mellitus           (if abnormal result is  confirmed) 5.7-6.4%   Increased risk of developing Diabetes Mellitus References:Diagnosis and Classification of Diabetes Mellitus,Diabetes [id number] 1):S62-S69 and Standards of Medical Care in         Diabetes - 2011,Diabetes Care,2011,34 (Suppl 1):S11-S61.    CBG:  Recent Labs Lab 01/15/16 2349 01/16/16 0419 01/16/16 0810 01/16/16 1125 01/16/16 1526  GLUCAP 105* 104* 113* 114* 108*    Recent Results (from the past 240 hour(s))  Blood culture (routine x 2)     Status: None (Preliminary result)   Collection Time: 01/13/16  5:15 PM  Result Value Ref Range Status   Specimen Description BLOOD LEFT HAND  Final   Special Requests BOTTLES DRAWN AEROBIC AND ANAEROBIC 5CC EA  Final   Culture NO GROWTH 3 DAYS  Final   Report Status PENDING  Incomplete  Urine culture     Status: Abnormal   Collection Time: 01/13/16  6:59 PM  Result Value Ref Range Status   Specimen Description URINE, CATHETERIZED  Final   Special Requests NONE  Final   Culture MULTIPLE SPECIES PRESENT, SUGGEST RECOLLECTION (A)  Final   Report Status 01/14/2016 FINAL  Final  Blood culture (routine x 2)     Status: None (Preliminary result)   Collection Time: 01/13/16 10:56 PM  Result Value Ref Range Status   Specimen Description BLOOD RIGHT HAND  Final   Special Requests IN PEDIATRIC BOTTLE 5CC  Final   Culture NO GROWTH 3 DAYS  Final   Report Status PENDING  Incomplete  MRSA PCR Screening     Status: Abnormal   Collection Time: 01/14/16  6:51 AM  Result Value Ref Range Status   MRSA by PCR POSITIVE (A) NEGATIVE Final    Comment:        The GeneXpert MRSA Assay (FDA approved for NASAL specimens only), is one component of a comprehensive MRSA colonization surveillance program. It is not intended to diagnose MRSA infection nor to guide or monitor treatment for MRSA infections. RESULT CALLED TO, READ BACK BY AND VERIFIED WITH:  S. ADAMS 1055 07.23.17 N. MORRIS   Culture, Urine     Status: Abnormal    Collection Time: 01/15/16 12:41 PM  Result Value Ref Range Status   Specimen Description URINE, CATHETERIZED  Final   Special Requests rocephin and vancomycin Normal  Final   Culture MULTIPLE SPECIES PRESENT, SUGGEST RECOLLECTION (A)  Final   Report Status 01/16/2016 FINAL  Final     Scheduled Meds: . cefTRIAXone (ROCEPHIN)  IV  1 g Intravenous Q24H  . Chlorhexidine Gluconate Cloth  6 each Topical Q0600  . insulin aspart  0-9 Units Subcutaneous Q4H  . insulin glargine  10 Units Subcutaneous QHS  . mupirocin ointment  1 application Nasal BID  . sodium chloride flush  3 mL Intravenous Q12H  . vancomycin  1,250 mg Intravenous Q24H   Continuous Infusions: . sodium chloride 50 mL/hr at 01/15/16 1724  . diltiazem (CARDIZEM) infusion 5 mg/hr (01/16/16 0000)  . heparin 1,400 Units/hr (01/16/16 1330)     LOS: 2 days    Cherene Altes, MD [facility] Office  [phone] Pager - Text Page per Amion as per below:  On-Call/Text Page:      [email or web address]      password [email or web address]  If 7PM-7AM, please contact night-coverage [email or web address] Password [facility] 01/16/2016, 4:35 PM

## 2016-01-16 NOTE — Progress Notes (Signed)
[place] for Heparin  Indication: Ischemic Limb  Allergies  Allergen Reactions  . Other Rash    Bandaids    Patient Measurements: Height: 5\' 2"  (157.5 cm) Weight: 205 lb (93 kg) IBW/kg (Calculated) : 50.1  HDW: ~72kg  Vital Signs: Temp: 99 F (37.2 C) (07/25 0814) Temp Source: Axillary (07/25 0814) BP: 122/81 (07/25 0814) Pulse Rate: 89 (07/25 0422)  Labs:  Recent Labs  01/13/16 2256 01/14/16 0530 01/14/16 0536 01/14/16 1104  01/15/16 0234 01/15/16 0915 01/15/16 1841 01/16/16 0219  HGB  --   --  11.3*  --   --  9.9*  --   --  11.3*  HCT  --   --  37.2  --   --  32.7*  --   --  36.8  PLT  --   --  510*  --   --  503*  --   --  569*  APTT  --   --  36  --   --   --   --   --   --   LABPROT  --   --  14.6  --   --   --   --   --   --   INR  --   --  1.13  --   --   --   --   --   --   HEPARINUNFRC  --   --   --   --   < >  --  0.27* 0.35 0.24*  CREATININE  --   --  0.81  --   --  0.65  --   --  0.60  TROPONINI <0.03 0.04*  --  0.06*  --   --   --   --   --   < > = values in this interval not displayed.  Estimated Creatinine Clearance: 60.6 mL/min (by C-G formula based on SCr of 0.8 mg/dL).   Assessment: 33 yom continuing on heparin drip for possible lower extremity ischemia. Hx of afib not on AC pta at SNF (appears possibly on NOAC at one point).   Heparin level therapeutic x1 (0.3) but at bottom of range after rate increase to 1350 units/hr. Hg improved 11.3, plt 569. No issues with line or bleeding per RN.  Goal of Therapy:  Heparin level 0.3-0.7 units/ml Monitor platelets by anticoagulation protocol: Yes   Plan:  -Increase heparin slightly to 1400 units/h to keep in range -8h HL to confirm, Daily CBC/HL -Monitor for bleeding -F/U vascular surgery plans   Elicia Lamp, PharmD, [facility] Clinical Pharmacist Pager [phone] 01/16/2016 9:16 AM

## 2016-01-16 NOTE — Progress Notes (Signed)
[place] for Heparin  Indication: Ischemic Limb  Allergies  Allergen Reactions  . Other Rash    Bandaids    Patient Measurements: Height: 5\' 2"  (157.5 cm) Weight: 205 lb (93 kg) IBW/kg (Calculated) : 50.1  HDW: ~72kg  Vital Signs: Temp: 98.2 F (36.8 C) (07/24 2343) Temp Source: Axillary (07/24 2343) BP: 119/58 (07/24 2343) Pulse Rate: 89 (07/24 2343)  Labs:  Recent Labs  01/13/16 2256 01/14/16 0530 01/14/16 0536 01/14/16 1104  01/15/16 0234 01/15/16 0915 01/15/16 1841 01/16/16 0219  HGB  --   --  11.3*  --   --  9.9*  --   --  11.3*  HCT  --   --  37.2  --   --  32.7*  --   --  36.8  PLT  --   --  510*  --   --  503*  --   --  569*  APTT  --   --  36  --   --   --   --   --   --   LABPROT  --   --  14.6  --   --   --   --   --   --   INR  --   --  1.13  --   --   --   --   --   --   HEPARINUNFRC  --   --   --   --   < >  --  0.27* 0.35 0.24*  CREATININE  --   --  0.81  --   --  0.65  --   --  0.60  TROPONINI <0.03 0.04*  --  0.06*  --   --   --   --   --   < > = values in this interval not displayed.  Estimated Creatinine Clearance: 60.6 mL/min (by C-G formula based on SCr of 0.8 mg/dL).   Assessment: 89 yom continuing on heparin drip for possible lower extremity ischemia. Hx of afib not on AC pta at SNF (appears possibly on NOAC at one point).   Heparin level down to subtherapeutic again (0.24) on 1200 units/hr. CBC stable. No issues with line or bleeding per RN   Goal of Therapy:  Heparin level 0.3-0.7 units/ml Monitor platelets by anticoagulation protocol: Yes   Plan:  Increase heparin to 1350 units/hr F/u 8 hour heparin level  Sherlon Handing, PharmD, BCPS Clinical pharmacist, pager [phone] 01/16/2016 4:12 AM

## 2016-01-16 NOTE — Progress Notes (Signed)
Palliative:  I have reached out to family to try and schedule meeting for [place]. No family at bedside today. I spoke with Christene Slates and he will check his schedule and speak with his brother about a time to meet with me tomorrow 7/26. Will await call back. Thank you for this consult.   Vinie Sill, NP Palliative Medicine Team Pager # [phone] (M-F 8a-5p) Team Phone # [phone] (Nights/Weekends)

## 2016-01-17 DIAGNOSIS — Z515 Encounter for palliative care: Secondary | ICD-10-CM

## 2016-01-17 DIAGNOSIS — E1165 Type 2 diabetes mellitus with hyperglycemia: Secondary | ICD-10-CM

## 2016-01-17 DIAGNOSIS — M79673 Pain in unspecified foot: Secondary | ICD-10-CM

## 2016-01-17 DIAGNOSIS — M79672 Pain in left foot: Secondary | ICD-10-CM

## 2016-01-17 DIAGNOSIS — I998 Other disorder of circulatory system: Secondary | ICD-10-CM | POA: Diagnosis present

## 2016-01-17 DIAGNOSIS — K592 Neurogenic bowel, not elsewhere classified: Secondary | ICD-10-CM

## 2016-01-17 DIAGNOSIS — E118 Type 2 diabetes mellitus with unspecified complications: Secondary | ICD-10-CM

## 2016-01-17 DIAGNOSIS — IMO0002 Reserved for concepts with insufficient information to code with codable children: Secondary | ICD-10-CM | POA: Diagnosis present

## 2016-01-17 DIAGNOSIS — I482 Chronic atrial fibrillation, unspecified: Secondary | ICD-10-CM | POA: Diagnosis present

## 2016-01-17 DIAGNOSIS — I999 Unspecified disorder of circulatory system: Secondary | ICD-10-CM

## 2016-01-17 DIAGNOSIS — N319 Neuromuscular dysfunction of bladder, unspecified: Secondary | ICD-10-CM | POA: Diagnosis present

## 2016-01-17 DIAGNOSIS — S22070A Wedge compression fracture of T9-T10 vertebra, initial encounter for closed fracture: Secondary | ICD-10-CM

## 2016-01-17 LAB — CBC
HEMATOCRIT: 37.7 % (ref 36.0–46.0)
HEMOGLOBIN: 11.5 g/dL — AB (ref 12.0–15.0)
MCH: 29.4 pg (ref 26.0–34.0)
MCHC: 30.5 g/dL (ref 30.0–36.0)
MCV: 96.4 fL (ref 78.0–100.0)
Platelets: 519 10*3/uL — ABNORMAL HIGH (ref 150–400)
RBC: 3.91 MIL/uL (ref 3.87–5.11)
RDW: 14.6 % (ref 11.5–15.5)
WBC: 13.6 10*3/uL — AB (ref 4.0–10.5)

## 2016-01-17 LAB — GLUCOSE, CAPILLARY
GLUCOSE-CAPILLARY: 121 mg/dL — AB (ref 65–99)
GLUCOSE-CAPILLARY: 135 mg/dL — AB (ref 65–99)
GLUCOSE-CAPILLARY: 139 mg/dL — AB (ref 65–99)
GLUCOSE-CAPILLARY: 141 mg/dL — AB (ref 65–99)
GLUCOSE-CAPILLARY: 161 mg/dL — AB (ref 65–99)
Glucose-Capillary: 146 mg/dL — ABNORMAL HIGH (ref 65–99)
Glucose-Capillary: 179 mg/dL — ABNORMAL HIGH (ref 65–99)

## 2016-01-17 MED ORDER — PRO-STAT SUGAR FREE PO LIQD
30.0000 mL | Freq: Two times a day (BID) | ORAL | Status: DC
  Administered 2016-01-17 – 2016-01-18 (×3): 30 mL
  Filled 2016-01-17 (×3): qty 30

## 2016-01-17 MED ORDER — JEVITY 1.2 CAL PO LIQD
1000.0000 mL | ORAL | Status: DC
  Administered 2016-01-17: 1000 mL
  Filled 2016-01-17 (×3): qty 1000

## 2016-01-17 NOTE — Progress Notes (Signed)
Initial Nutrition Assessment  DOCUMENTATION CODES:   Obesity unspecified  INTERVENTION:   -Continue Jevity 1.2 @ 10 ml/hr via PEG and increase by 10 ml every 4 hours to goal rate of 50 ml/hr.   30 ml Prostat BID.    Tube feeding regimen provides 1640 kcal (100% of needs), 97 grams of protein, and 968 ml of H2O (total of 1368 ml H2O with inclusion of free water flush regimen).   NUTRITION DIAGNOSIS:   Inadequate oral intake related to inability to eat as evidenced by NPO status.  GOAL:   Patient will meet greater than or equal to 90% of their needs  MONITOR:   Labs, Weight trends, TF tolerance, Skin, I & O's  REASON FOR ASSESSMENT:   Consult, Low Braden Enteral/tube feeding initiation and management  ASSESSMENT:   82 y.o. 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 Being admitted for sepsis secondary to UTI and possibly left foot ischemia in a setting of uncontrolled and anticoagulated atrial fibrillation  Pt admitted with sepsis due to UTI.   Pt non-verbal at baseline. No family at bedside to obtain hx.   Pt with pre-existing PEG. Jevity 1.2 is infusing @ 10 ml/hr, per orders. Also noted 100 ml free water flush every 8 hours. Current regimen providing 288 kcals, 13 grams protein, and 194 ml fluid daily (total 594 ml fluid with inclusion of free water flush regimen), which provides 19% of estimated kcal needs and 15% of estimated protein needs.   Reviewed wt hx, which reveals wt stability. UBW around 200#.   Pt with multiple wounds, including stage II pressure injuries to lt ankle, and DTI to lt foot.   Nutrition-Focused physical exam completed. Findings are no fat depletion, no muscle depletion, and mild edema.   Case discussed with RN. Pt is a [facility]; reviewed available records,  however, unsure of previous TF regimen. RN confirmed Palliative Care consult- plan for goals of care meeting later today.   Labs reviewed: CBGS: 108-146.   Diet Order:  Diet NPO time specified  Skin:  Wound (see comment) (st II coccyx and lt ankle, DTI lt foot)  Last BM:  PTA  Height:   Ht Readings from Last 1 Encounters:  01/14/16 5\' 2"  (1.575 m)    Weight:   Wt Readings from Last 1 Encounters:  01/13/16 205 lb (93 kg)    Ideal Body Weight:  50 kg  BMI:  Body mass index is 37.49 kg/m.  Estimated Nutritional Needs:   Kcal:  1550-1750  Protein:  85-100 grams  Fluid:  1.5-1.7 L  EDUCATION NEEDS:   No education needs identified at this time  Yolanda Solis A. Jimmye Norman, RD, LDN, CDE Pager: [phone] After hours Pager: [phone]

## 2016-01-17 NOTE — Progress Notes (Signed)
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 It was found to be blue initially, but reportedly the coloration improved after the limb's position was adjusted.  In the ER she was found to be tachycardic up to 139, with evidence of a urinary tract infection, WBC 14.8, and lactic acid 2.63   Assessment & Plan:   Active Problems:   HTN (hypertension)   DM type 2 (diabetes mellitus, type 2) (HCC)   UTI (lower urinary tract infection)   Atrial fibrillation with RVR (HCC)   Atrial fibrillation with rapid ventricular response (HCC)   DM (diabetes mellitus) (HCC)   Right hemiplegia (HCC)   Pressure ulcer   Chronic atrial fibrillation (HCC)   Ischemic pain of left foot   Closed wedge compression fracture of tenth thoracic vertebra (HCC)   Uncontrolled type 2 diabetes mellitus with complication (HCC)   Neurogenic bowel   Neurogenic bladder   Sepsis due to UTI Continue empiric antibiotics - urine culture has not proven to be helpful - sepsis physiology improving - plan to complete 7 days of empiric therapy  Ischemia of left foot The patient's L great toe is w/o signif change today - it remains mildly purplish today and colder to touch than the R great toe -  I suspect she may very well have infarcted at least this toe - the remainder of the foot is warm to touch though slightly discolored compared to the right foot - I'm not able to appreciate pulses on either foot on palpation   Chronic atrial fibrillation with acute RVR Heart rate  well controlled on Cardizem drip - transition to per tube meds today  - anticoagulated with IV  heparin - does not appear to be on chronic anticoagulation at this time though outpatient notes from [place] suggest at one point she was prescribed a NOAC - will transition to lovenox for now, until further discussion can be had w/ family in regards to goals of care   T10 fracture with complete paraplegia after a fall - Neurogenic bowel and bladder Cont supportive care  Hypertension Blood pressure well controlled at present   Normocytic anemia Likely anemia of chronic disease with smoldering lower extremity wounds as well as poor nutrition - no evidence of acute blood loss -  Hgb has been stable   DM type 2 uncontrolled with complication 123456 9.9 -  CBG currently well-controlled   Goals of care  the patient's day-to-day quality of life is not clear to me - what I have seen over the last 24-48 hours however is a very low quality of life with significant altered sensorium - I have asked Palliative Care to assist [place] in goals of care planning - I'm informed that her 2 most involved sons are apparently at odds with one another in regards to appropriate care going forward -0000000 Spoke to NP Pershing Proud palliative care who scheduled to speak with family today concerning goals of care in relation to her LLE foot.   DVT prophylaxis: Lovenox Code Status: DO NOT RESUSCITATE Family Communication: None Disposition Plan: Will await recommendations from palliative care   Consultants:  NP Pershing Proud palliative care    Procedures/Significant Events:  7/23 bilateral lower extremity ABIs- unable to obtain useful information of L LE - severe arterial insuff noted on RLE    Cultures   Antimicrobials: Ceftriaxone 7/22 > Vancomycin 7/22 >   Devices    LINES / TUBES:      Continuous Infusions: . sodium chloride Stopped (01/17/16 0600)  . feeding supplement (JEVITY 1.2 CAL) 1,000 mL (01/17/16 1800)     Subjective:  7/26 A/O 0 patient obtunded, will respond to heavy painful  stimuli, , but only moans/Wails..     Objective: Vitals:   01/17/16 1600 01/17/16 1615 01/17/16 1700 01/17/16 1800  BP: (!) 141/63 138/67 133/71 122/63  Pulse: 85 90 86 (!) 57  Resp: (!) 9 12 (!) 9 (!) 7  Temp:  98.6 F (37 C)    TempSrc:  Axillary    SpO2: 100% 100% 100% 100%  Weight:      Height:        Intake/Output Summary (Last 24 hours) at 01/17/16 2007 Last data filed at 01/17/16 1800  Gross per 24 hour  Intake              580 ml  Output              920 ml  Net             -340 ml   Filed Weights   01/13/16 1614  Weight: 93 kg (205 lb)    Examination:  General:A/O 0 patient obtunded, will respond to heavy painful stimuli, No acute respiratory distress Eyes: negative scleral hemorrhage, negative anisocoria, negative icterus ENT: Negative Runny nose, negative gingival bleeding, Neck:  Negative scars, masses, torticollis, lymphadenopathy, JVD Lungs: Clear to auscultation bilaterally without wheezes or crackles Cardiovascular: Irregular irregular rhythm and rate, negative murmur gallop or rub normal S1 and S2 Abdomen: negative abdominal pain, nondistended, positive soft, bowel sounds, no rebound, no ascites, no appreciable mass Extremities: positive LLE cyanosis, cold to touch, nonpalpable pulse. RLE WNL Skin: Negative rashes, lesions, ulcers Psychiatric:  Unable to evaluate  Central nervous system:  Response to heavy painful stimuli  .     Data Reviewed: Care during the described time interval was provided by me .  I have reviewed this patient's available data, including medical history, events of note, physical examination, and all test results as part of my evaluation. I have personally reviewed and interpreted all radiology studies.  CBC:  Recent Labs Lab 01/13/16 1715 01/14/16 0536 01/15/16 0234 01/16/16 0219 01/17/16 0216  WBC 14.8* 15.2* 15.3* 14.8* 13.6*  NEUTROABS 9.5*  --   --   --   --   HGB 10.9* 11.3* 9.9* 11.3* 11.5*  HCT 35.5* 37.2  32.7* 36.8 37.7  MCV 96.5 96.1 96.2 96.8 96.4  PLT 522* 510* 503* 569* [id number]*   Basic Metabolic Panel:  Recent Labs Lab 01/13/16 1715 01/14/16 0536 01/15/16 0234 01/16/16 0219  NA 137 138 140 138  K 4.5 4.0 3.7 3.9  CL 97* 103 108 105  CO2 30 27 25 23   GLUCOSE 103* 154* 82 97  BUN 43* 32* 19 10  CREATININE 0.76 0.81 0.65 0.60  CALCIUM 9.4 8.7* 8.2* 8.3*  MG  --  1.9  --   --   PHOS  --  3.7  --  3.1   GFR: Estimated Creatinine Clearance: 60.6 mL/min (by C-G formula based on SCr of 0.8 mg/dL). Liver Function Tests:  Recent  Labs Lab 01/14/16 0536 01/15/16 0234 01/16/16 0219  AST 23 24  --   ALT 10* 8*  --   ALKPHOS 60 56  --   BILITOT 0.6 0.6  --   PROT 6.4* 5.9*  --   ALBUMIN 2.3* 2.1* 2.1*   No results for input(s): LIPASE, AMYLASE in the last 168 hours. No results for input(s): AMMONIA in the last 168 hours. Coagulation Profile:  Recent Labs Lab 01/14/16 0536  INR 1.13   Cardiac Enzymes:  Recent Labs Lab 01/13/16 2256 01/14/16 0530 01/14/16 1104  TROPONINI <0.03 0.04* 0.06*   BNP (last 3 results) No results for input(s): PROBNP in the last 8760 hours. HbA1C: No results for input(s): HGBA1C in the last 72 hours. CBG:  Recent Labs Lab 01/16/16 2337 01/17/16 0345 01/17/16 0806 01/17/16 1120 01/17/16 1613  GLUCAP 139* 146* 141* 121* 179*   Lipid Profile: No results for input(s): CHOL, HDL, LDLCALC, TRIG, CHOLHDL, LDLDIRECT in the last 72 hours. Thyroid Function Tests: No results for input(s): TSH, T4TOTAL, FREET4, T3FREE, THYROIDAB in the last 72 hours. Anemia Panel: No results for input(s): VITAMINB12, FOLATE, FERRITIN, TIBC, IRON, RETICCTPCT in the last 72 hours. Urine analysis:    Component Value Date/Time   COLORURINE YELLOW 01/13/2016 1859   APPEARANCEUR CLOUDY (A) 01/13/2016 1859   LABSPEC 1.023 01/13/2016 1859   PHURINE 8.5 (H) 01/13/2016 1859   GLUCOSEU NEGATIVE 01/13/2016 1859   HGBUR NEGATIVE 01/13/2016 1859   BILIRUBINUR  NEGATIVE 01/13/2016 1859   KETONESUR NEGATIVE 01/13/2016 1859   PROTEINUR NEGATIVE 01/13/2016 1859   UROBILINOGEN 1.0 03/11/2014 1044   NITRITE POSITIVE (A) 01/13/2016 1859   LEUKOCYTESUR LARGE (A) 01/13/2016 1859   Sepsis Labs: @LABRCNTIP (procalcitonin:4,lacticidven:4)  ) Recent Results (from the past 240 hour(s))  Blood culture (routine x 2)     Status: None (Preliminary result)   Collection Time: 01/13/16  5:15 PM  Result Value Ref Range Status   Specimen Description BLOOD LEFT HAND  Final   Special Requests BOTTLES DRAWN AEROBIC AND ANAEROBIC 5CC EA  Final   Culture NO GROWTH 4 DAYS  Final   Report Status PENDING  Incomplete  Urine culture     Status: Abnormal   Collection Time: 01/13/16  6:59 PM  Result Value Ref Range Status   Specimen Description URINE, CATHETERIZED  Final   Special Requests NONE  Final   Culture MULTIPLE SPECIES PRESENT, SUGGEST RECOLLECTION (A)  Final   Report Status 01/14/2016 FINAL  Final  Blood culture (routine x 2)     Status: None (Preliminary result)   Collection Time: 01/13/16 10:56 PM  Result Value Ref Range Status   Specimen Description BLOOD RIGHT HAND  Final   Special Requests IN PEDIATRIC BOTTLE 5CC  Final   Culture NO GROWTH 4 DAYS  Final   Report Status PENDING  Incomplete  MRSA PCR Screening     Status: Abnormal   Collection Time: 01/14/16  6:51 AM  Result Value Ref Range Status   MRSA by PCR POSITIVE (A) NEGATIVE Final    Comment:        The GeneXpert MRSA Assay (FDA approved for NASAL specimens only), is one component of a comprehensive MRSA colonization surveillance program. It is not intended to diagnose MRSA infection nor to guide or monitor treatment for MRSA infections. RESULT CALLED TO, READ BACK BY AND VERIFIED WITH:  S. ADAMS [id number] 07.23.17 N. MORRIS   Culture, Urine     Status: Abnormal   Collection Time: 01/15/16 12:41  PM  Result Value Ref Range Status   Specimen Description URINE, CATHETERIZED  Final   Special  Requests rocephin and vancomycin Normal  Final   Culture MULTIPLE SPECIES PRESENT, SUGGEST RECOLLECTION (A)  Final   Report Status 01/16/2016 FINAL  Final         Radiology Studies: No results found.      Scheduled Meds: . cefTRIAXone (ROCEPHIN)  IV  1 g Intravenous Q24H  . Chlorhexidine Gluconate Cloth  6 each Topical Q0600  . diltiazem  30 mg Per Tube Q6H  . enoxaparin (LOVENOX) injection  1 mg/kg Subcutaneous Q12H  . feeding supplement (PRO-STAT SUGAR FREE 64)  30 mL Per Tube BID  . free water  100 mL Per Tube Q8H  . insulin aspart  0-9 Units Subcutaneous Q4H  . insulin glargine  10 Units Subcutaneous QHS  . mupirocin ointment  1 application Nasal BID  . sodium chloride flush  3 mL Intravenous Q12H   Continuous Infusions: . sodium chloride Stopped (01/17/16 0600)  . feeding supplement (JEVITY 1.2 CAL) 1,000 mL (01/17/16 1800)     LOS: 3 days    Time spent: 40 minutes    Yolanda Solis, Geraldo Docker, MD [facility] Pager [phone]   If 7PM-7AM, please contact night-coverage [email or web address] Password [facility] 01/17/2016, 8:07 PM

## 2016-01-18 DIAGNOSIS — I998 Other disorder of circulatory system: Secondary | ICD-10-CM

## 2016-01-18 DIAGNOSIS — N3000 Acute cystitis without hematuria: Secondary | ICD-10-CM | POA: Diagnosis present

## 2016-01-18 DIAGNOSIS — I4891 Unspecified atrial fibrillation: Secondary | ICD-10-CM

## 2016-01-18 DIAGNOSIS — I10 Essential (primary) hypertension: Secondary | ICD-10-CM

## 2016-01-18 DIAGNOSIS — Z515 Encounter for palliative care: Secondary | ICD-10-CM

## 2016-01-18 DIAGNOSIS — A419 Sepsis, unspecified organism: Secondary | ICD-10-CM

## 2016-01-18 DIAGNOSIS — N390 Urinary tract infection, site not specified: Secondary | ICD-10-CM

## 2016-01-18 LAB — GLUCOSE, CAPILLARY
GLUCOSE-CAPILLARY: 199 mg/dL — AB (ref 65–99)
GLUCOSE-CAPILLARY: 215 mg/dL — AB (ref 65–99)
GLUCOSE-CAPILLARY: 199 mg/dL — AB (ref 65–99)
Glucose-Capillary: 146 mg/dL — ABNORMAL HIGH (ref 65–99)

## 2016-01-18 LAB — CULTURE, BLOOD (ROUTINE X 2)
CULTURE: NO GROWTH
Culture: NO GROWTH

## 2016-01-18 LAB — CBC
HEMATOCRIT: 33.3 % — AB (ref 36.0–46.0)
Hemoglobin: 10.3 g/dL — ABNORMAL LOW (ref 12.0–15.0)
MCH: 29.2 pg (ref 26.0–34.0)
MCHC: 30.9 g/dL (ref 30.0–36.0)
MCV: 94.3 fL (ref 78.0–100.0)
Platelets: 516 10*3/uL — ABNORMAL HIGH (ref 150–400)
RBC: 3.53 MIL/uL — ABNORMAL LOW (ref 3.87–5.11)
RDW: 14.6 % (ref 11.5–15.5)
WBC: 11.8 10*3/uL — ABNORMAL HIGH (ref 4.0–10.5)

## 2016-01-18 MED ORDER — ENOXAPARIN SODIUM 30 MG/0.3ML ~~LOC~~ SOLN: 90.0000 mg | Freq: Two times a day (BID) | SUBCUTANEOUS | 0 refills | Status: AC

## 2016-01-18 MED ORDER — DEXTROSE 5 % IV SOLN: 1.0000 g | INTRAVENOUS | 0 refills | Status: AC

## 2016-01-18 MED ORDER — DIAZEPAM 1 MG/ML PO SOLN
1.0000 mg | Freq: Three times a day (TID) | ORAL | Status: DC | PRN
Start: 2016-01-18 — End: 2016-01-18

## 2016-01-18 MED ORDER — INSULIN ASPART 100 UNIT/ML ~~LOC~~ SOLN: 0.0000 [IU] | SUBCUTANEOUS | 11 refills | Status: AC

## 2016-01-18 MED ORDER — FREE WATER: 100.0000 mL | Freq: Three times a day (TID) | 0 refills | Status: AC

## 2016-01-18 MED ORDER — JEVITY 1.2 CAL PO LIQD: 1000.0000 mL | ORAL | 0 refills | Status: AC

## 2016-01-18 MED ORDER — ENOXAPARIN SODIUM 100 MG/ML ~~LOC~~ SOLN
90.0000 mg | Freq: Two times a day (BID) | SUBCUTANEOUS | Status: DC
Start: 2016-01-18 — End: 2016-01-18

## 2016-01-18 MED ORDER — MORPHINE SULFATE (CONCENTRATE) 10 MG/0.5ML PO SOLN: 5.0000 mg | ORAL | Status: DC | PRN

## 2016-01-18 MED ORDER — MORPHINE SULFATE (CONCENTRATE) 10 MG/0.5ML PO SOLN: 5.0000 mg | ORAL | 0 refills | Status: AC | PRN

## 2016-01-18 MED ORDER — DILTIAZEM 12 MG/ML ORAL SUSPENSION: 30.0000 mg | Freq: Four times a day (QID) | ORAL | 0 refills | Status: AC

## 2016-01-18 MED ORDER — MORPHINE SULFATE 10 MG/5ML PO SOLN: 5.0000 mg | ORAL | Status: DC | PRN

## 2016-01-18 MED ORDER — ONDANSETRON HCL 4 MG PO TABS: 4.0000 mg | ORAL_TABLET | Freq: Four times a day (QID) | ORAL | 0 refills | Status: AC | PRN

## 2016-01-18 MED ORDER — OXYCODONE HCL 5 MG/5ML PO SOLN: 5.0000 mg | ORAL | 0 refills | Status: AC

## 2016-01-18 MED ORDER — POLYETHYLENE GLYCOL 3350 17 G PO PACK: 17.0000 g | PACK | Freq: Every day | ORAL | 0 refills | Status: AC

## 2016-01-18 MED ORDER — OXYCODONE HCL 5 MG/5ML PO SOLN
5.0000 mg | ORAL | Status: DC
  Administered 2016-01-18 (×3): 5 mg
  Filled 2016-01-18 (×3): qty 5

## 2016-01-18 MED ORDER — INSULIN GLARGINE 100 UNIT/ML ~~LOC~~ SOLN: 10.0000 [IU] | Freq: Every day | SUBCUTANEOUS | 11 refills | Status: AC

## 2016-01-18 MED ORDER — DIAZEPAM 1 MG/ML PO SOLN: 1.0000 mg | Freq: Three times a day (TID) | ORAL | 0 refills | Status: AC | PRN

## 2016-01-18 MED ORDER — PRO-STAT SUGAR FREE PO LIQD: 30.0000 mL | Freq: Two times a day (BID) | ORAL | 0 refills | Status: AC

## 2016-01-18 NOTE — Progress Notes (Signed)
Pt being transferred back to snf today.  DC'd pivs.  Pain med adm.  No s/s of acute distress.  Pt ready for transport.  AVS will be sent with transfer packet.

## 2016-01-18 NOTE — Progress Notes (Addendum)
[place] for Lovenox  Indication: Ischemic Limb  Allergies  Allergen Reactions  . Other Rash    Bandaids    Patient Measurements: Height: 5\' 2"  (157.5 cm) Weight: 190 lb 11.2 oz (86.5 kg) IBW/kg (Calculated) : 50.1  HDW: ~72kg  Vital Signs: Temp: 98 F (36.7 C) (07/27 0400) Temp Source: Axillary (07/27 0000) BP: 128/51 (07/27 0800) Pulse Rate: 86 (07/27 0800)  Labs:  Recent Labs  01/15/16 1841  01/16/16 0219 01/16/16 1044 01/17/16 0216 01/18/16 0556  HGB  --   < > 11.3*  --  11.5* 10.3*  HCT  --   --  36.8  --  37.7 33.3*  PLT  --   --  569*  --  519* 516*  HEPARINUNFRC 0.35  --  0.24* 0.30  --   --   CREATININE  --   --  0.60  --   --   --   < > = values in this interval not displayed.  Estimated Creatinine Clearance: 58.2 mL/min (by C-G formula based on SCr of 0.8 mg/dL).   Assessment: 42 yom on heparin drip for possible lower extremity ischemia. Hx of afib not on AC pta at SNF (appears possibly on NOAC at one point).   Heparin transitioned to Lovenox on 7/25. Hg down 10.3, plt 516 stable, no bleed documented. CrCl>30. Wt decreased from 93kg > 86.5kg since admit.   Goal of Therapy:  Anti-Xa level 0.6-1 units/ml 4hrs after LMWH dose given  Monitor platelets by anticoagulation protocol: Yes   Plan:  -Decrease lovenox to 90mg  [place] q12h for decreasing wt -CBC q72h, monitor s/sx bleeding -F/u GOC discussions w/ family on [place], PharmD, [facility] Clinical Pharmacist Pager [phone] 01/18/2016 8:37 AM

## 2016-01-18 NOTE — Consult Note (Signed)
Consultation Note Date: 01/18/2016   Patient Name: Yolanda Solis  DOB: 25-Sep-1933  MRN: [id number]  Age / Sex: 82 y.o., female  PCP: Redmond School, MD Referring Physician: Allie Bossier, MD  Reason for Consultation: Establishing goals of care  HPI/Patient Profile: 82 y.o. female  with past medical history of T10 displaced fracture with cord compression since 2015 leading to complete paraplegia, CVA in June 2017 resulting in aphasia & Right Hemiparesis, s/p PEG, Chronic Afib, DM, and HTN admitted on 01/13/2016 with cold, blue left new since the night before.   Clinical Assessment and Goals of Care: I met today with Ms. Gotschall (non-verbal and did not participate). Both sons are present for conversation. They obviously have differing opinions. They agree to continue anticoagulation (understanding that if she bleeds we cannot continue this) and tube feeding. They want to maintain care but agree on NO surgery or invasive interventions but would want antibiotics. They seem to have differing opinions on rehospitalization - one seems inclined for more comfort care and the other seems to want more work up and treatment. They both understand prognosis is very poor and QOL is very poor. One son is very upset with the care at [facility] and they both agree that she could benefit from the help and support of hospice with symptom management to keep her comfortable. One son would benefit from the help of hospice when she declines next to help transition to more comfort care. DNR confirmed. Emotional support provided. Hard Choices booklet given.   NEXT OF KIN 2 sons although they do not always agree    SUMMARY OF RECOMMENDATIONS   - To SNF with HOSPICE - Continue TF, anticoagulation, supportive care - DNR confirmed - Will need continued discussions/support from hospice on further interventions and how aggressive they  should be (or not be) given QOL  Code Status/Advance Care Planning:  DNR   Symptom Management:   Pain: Continue Vicodin prn - will reassess tomorrow for scheduled meds. Morphine IV prn. Family also says that she takes Zanaflex but we do not have list of meds from SNF. Consider muscle relaxant. I would even consider Valium prn for anxiety/muscle relaxant properties.   Palliative Prophylaxis:   Aspiration, Bowel Regimen and Delirium Protocol  Additional Recommendations (Limitations, Scope, Preferences):  Avoid Hospitalization, no surgery  Psycho-social/Spiritual:   Desire for further Chaplaincy support:no  Additional Recommendations: Caregiving  Support/Resources and Education on Hospice  Prognosis:   < 6 months  Discharge Planning: [place] with Hospice      Primary Diagnoses: Present on Admission: . HTN (hypertension) . UTI (lower urinary tract infection) . Atrial fibrillation with RVR ([place]) . Atrial fibrillation with rapid ventricular response ([place]) . Pressure ulcer . Chronic atrial fibrillation ([place]) . Ischemic pain of left foot . Closed wedge compression fracture of tenth thoracic vertebra (HCC) . Uncontrolled type 2 diabetes mellitus with complication ([place]) . Neurogenic bowel . Neurogenic bladder   I have reviewed the medical record, interviewed the patient and family, and examined  the patient. The following aspects are pertinent.  Past Medical History:  Diagnosis Date  . Arthritis   . Diabetes mellitus   . Hypertension    Social History   Social History  . Marital status: Divorced    Spouse name: N/A  . Number of children: N/A  . Years of education: N/A   Social History Main Topics  . Smoking status: Never Smoker  . Smokeless tobacco: None  . Alcohol use No  . Drug use: No  . Sexual activity: No   Other Topics Concern  . None   Social History Narrative  . None   Family History  Problem Relation Age of Onset  . Heart  failure Mother   . Heart failure Father   . Stroke Neg Hx   . Cancer Neg Hx    Scheduled Meds: . cefTRIAXone (ROCEPHIN)  IV  1 g Intravenous Q24H  . Chlorhexidine Gluconate Cloth  6 each Topical Q0600  . diltiazem  30 mg Per Tube Q6H  . enoxaparin (LOVENOX) injection  1 mg/kg Subcutaneous Q12H  . feeding supplement (PRO-STAT SUGAR FREE 64)  30 mL Per Tube BID  . free water  100 mL Per Tube Q8H  . insulin aspart  0-9 Units Subcutaneous Q4H  . insulin glargine  10 Units Subcutaneous QHS  . mupirocin ointment  1 application Nasal BID  . sodium chloride flush  3 mL Intravenous Q12H   Continuous Infusions: . sodium chloride Stopped (01/17/16 0600)  . feeding supplement (JEVITY 1.2 CAL) 1,000 mL (01/18/16 0600)   PRN Meds:.acetaminophen **OR** acetaminophen, ALPRAZolam, HYDROcodone-acetaminophen, morphine injection, ondansetron **OR** ondansetron (ZOFRAN) IV Medications Prior to Admission:  Prior to Admission medications   Medication Sig Start Date End Date Taking? Authorizing Provider  ALPRAZolam Duanne Moron) 0.25 MG tablet Take one tablet by mouth three times daily as needed for anixety 03/15/14  Yes Mahima Pandey, MD  Amino Acids-Protein Hydrolys (FEEDING SUPPLEMENT, PRO-STAT SUGAR FREE 64,) LIQD Take 30 mLs by mouth daily.   Yes Historical Provider, MD  calcium-vitamin D (OSCAL WITH D) 500-200 MG-UNIT tablet Take 1 tablet by mouth.   Yes Historical Provider, MD  diazepam (VALIUM) 5 MG tablet Take 0.5 tablets (2.5 mg total) by mouth every 8 (eight) hours as needed for anxiety. 02/16/14  Yes Virgel Manifold, MD  docusate sodium (COLACE) 100 MG capsule Take 1 capsule (100 mg total) by mouth every 12 (twelve) hours. 02/16/14  Yes Virgel Manifold, MD  ibuprofen (ADVIL,MOTRIN) 200 MG tablet Take 400 mg by mouth at bedtime.    Yes Historical Provider, MD  insulin glargine (LANTUS) 100 UNIT/ML injection Inject 20-45 Units into the skin 2 (two) times daily. Inject 45 units each morning and 20 units each  evening.   Yes Historical Provider, MD  insulin lispro (HUMALOG) 100 UNIT/ML injection Inject 9 Units into the skin 3 (three) times daily before meals.   Yes Historical Provider, MD  lisinopril (PRINIVIL,ZESTRIL) 5 MG tablet Take 5 mg by mouth daily.   Yes Historical Provider, MD  loratadine (ALLERGY RELIEF) 10 MG tablet Take 10 mg by mouth daily.   Yes Historical Provider, MD  metFORMIN (GLUCOPHAGE) 500 MG tablet Take 1 tablet by mouth 2 (two) times daily.   Yes Historical Provider, MD  metoprolol succinate (TOPROL-XL) 25 MG 24 hr tablet Take 1 tablet by mouth 2 (two) times daily. 11/20/15  Yes Historical Provider, MD  oxyCODONE-acetaminophen (PERCOCET/ROXICET) 5-325 MG per tablet Take 1-2 tablets by mouth every 4 (four) hours as needed  for severe pain. 02/16/14  Yes Virgel Manifold, MD  Polyethylene Glycol 3350 (MIRALAX PO) Take 17 g by mouth daily.   Yes Historical Provider, MD  vitamin C (ASCORBIC ACID) 500 MG tablet Take 500 mg by mouth daily.   Yes Historical Provider, MD  zinc sulfate 220 (50 Zn) MG capsule Take 220 mg by mouth daily.   Yes Historical Provider, MD  enoxaparin (LOVENOX) 30 MG/0.3ML injection Inject 0.3 mLs (30 mg total) into the skin every 12 (twelve) hours. 01/13/16   Allie Bossier, MD  HYDROcodone-acetaminophen (NORCO/VICODIN) 5-325 MG per tablet Take two tablets by mouth every 4 hours as needed for pain 03/15/14   Blanchie Serve, MD  sulfamethoxazole-trimethoprim (BACTRIM DS,SEPTRA DS) 800-160 MG tablet Take 1 tablet by mouth 2 (two) times daily. 01/13/16 01/20/16  Allie Bossier, MD   Allergies  Allergen Reactions  . Other Rash    Bandaids   Review of Systems  Unable to perform ROS: Patient nonverbal  Endocrine: Positive for polydipsia.    Physical Exam  Constitutional: She appears well-developed. She appears lethargic.  HENT:  Head: Normocephalic and atraumatic.  Cardiovascular: Normal rate.  An irregularly irregular rhythm present.  Pulmonary/Chest: Effort normal. No  accessory muscle usage. No tachypnea. No respiratory distress.  Abdominal: Soft. Normal appearance.    PEG CDI  Neurological: She appears lethargic. She is not disoriented.  Aphasic, nonverbal    Vital Signs: BP (!) 128/51 (BP Location: Right Arm)   Pulse 86   Temp 98 F (36.7 C)   Resp 19   Ht _0  (1.575 m)   Wt 86.5 kg (190 lb 11.2 oz)   SpO2 100%   BMI 34.88 kg/m  Pain Assessment: PAINAD POSS *See Group Information*: 2-Acceptable,Slightly drowsy, easily aroused Pain Score: Asleep   SpO2: SpO2: 100 % O2 Device:SpO2: 100 % O2 Flow Rate: .O2 Flow Rate (L/min): 2 L/min  IO: Intake/output summary:  Intake/Output Summary (Last 24 hours) at 01/18/16 0816 Last data filed at 01/18/16 0800  Gross per 24 hour  Intake          1032.33 ml  Output              720 ml  Net           312.33 ml    LBM: Last BM Date:  (PTA) Baseline Weight: Weight: 93 kg (205 lb) Most recent weight: Weight: 86.5 kg (190 lb 11.2 oz)     Palliative Assessment/Data:     Time In: 1600 Time Out: 1730 Time Total: 12mn Greater than 50%  of this time was spent counseling and coordinating care related to the above assessment and plan.  Signed by: PPershing Proud NP   Please contact Palliative Medicine Team phone at [phone] questions and concerns.  For individual provider: See AShea Evans

## 2016-01-18 NOTE — Discharge Summary (Signed)
Physician Discharge Summary  FRAIDA RADEMACHER [id number] DOB: Sep 21, 1933 DOA: 01/13/2016  PCP: Glo Herring., MD  Admit date: 01/13/2016 Discharge date: 01/18/2016  Time spent: 35 minutes  Recommendations for Outpatient Follow-up:  Sepsis Unspecified organism/ UTI -Complete 7 days of Empiric antibiotics  - urine culture has not proven to be helpful - sepsis physiology improving   Ischemia of left foot -All toes of left foot cyanotic, cold touch, negative palpable pulses. NP [place] spoke at length with family, and they agreed that no further escalation of care should occur. -Follow-up with palliative care or hospice services as deemed appropriate.   Chronic atrial fibrillation with acute RVR -Heart rate well controlled on Cardizem 30 mg  QID -Continue full dose Lovenox.   T10 fracture with complete paraplegia after a fall - Neurogenic bowel and bladder -Cont supportive care  Hypertension -Blood pressure well controlled at present, without medication.   Normocytic anemia -Likely anemia of chronic disease with smoldering lower extremity wounds as well as poor nutrition  - no evidence of acute blood loss  - Hgb has been stable   Recent Labs Lab 01/14/16 0536 01/15/16 0234 01/16/16 0219 01/17/16 0216 01/18/16 0556  HGB 11.3* 9.9* 11.3* 11.5* 10.3*   DM type 2 uncontrolled with complication -[id number] Hemoglobin A1c =9.9 -Although not within ADA guidelines would not attempt to lower this patient's hemoglobin A1c further.    Goals of care  -the patient's day-to-day quality of life is poor -[id number] Spoke to NP Pershing Proud palliative care who spoke with sons today and they would like to keep patient comfortable, and not escalate care i.e. no surgery to LLE foot.     Discharge Diagnoses:  Active Problems:   HTN (hypertension)   DM type 2 (diabetes mellitus, type 2) (HCC)   Urinary tract infectious disease   Atrial fibrillation with  RVR (HCC)   Atrial fibrillation with rapid ventricular response (HCC)   DM (diabetes mellitus) (HCC)   Right hemiplegia (HCC)   Pressure ulcer   Chronic atrial fibrillation (HCC)   Ischemic pain of foot   Closed wedge compression fracture of tenth thoracic vertebra ([place])   Uncontrolled type 2 diabetes mellitus with complication [facility])   Neurogenic bowel   Neurogenic bladder   Palliative care encounter   Limb ischemia   Sepsis, unspecified organism ([place])   Acute cystitis without hematuria   Discharge Condition: Guarded  Diet recommendation: Jevity 1.2 CAL 46ml/hr + Prostat sugar free 64, 54ml BID  Filed Weights   01/13/16 1614 01/18/16 0500  Weight: 93 kg (205 lb) 86.5 kg (190 lb 11.2 oz)    History of present illness:  82 y.o.WF PMHx  T10 displaced fracture with cord compression since 2015 leading to complete paraplegia, 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 It was found to be blue initially, but reportedly the coloration improved after the limb's position was adjusted.  In the ER she was found to be tachycardic up to 139, with evidence of a urinary tract infection, WBC 14.8, and lactic acid 2.63 During his hospitalization patient was treated for sepsis of unspecified organism, most likely a UTI.Continues to have a leukocytosis but significantly improved, has been afebrile in last 24 hours.      Consultants:  NP Pershing Proud palliative care    Procedures/Significant Events:  7/23  bilateral lower extremity ABIs- unable to obtain useful information of L LE - severe arterial insuff noted on RLE   Cultures 7/22 blood left/right hand NGTD 7/22 urine positive multiple species 7/22 MRSA by PCR positive 7/24 urine positive multiple species    Antimicrobials: Ceftriaxone 7/22 > Vancomycin 7/22 >   Devices                          LINES / TUBES:    Discharge Exam: Vitals:   01/18/16 1100 01/18/16 1200 01/18/16 1300 01/18/16 1400  BP: (!) 126/56 (!) 110/54 129/61 (!) 122/54  Pulse: 80 81 76 78  Resp: (!) 9 19 11  (!) 9  Temp:  98.4 F (36.9 C)    TempSrc:  Oral    SpO2: 100% 100% 99% 100%  Weight:      Height:        General:Eyes open, A/O 0 patient obtunded, will respond to heavy painful stimuli, No acute respiratory distress Eyes: negative scleral hemorrhage, negative anisocoria, negative icterus ENT: Negative Runny nose, negative gingival bleeding, Neck:  Negative scars, masses, torticollis, lymphadenopathy, JVD Lungs: Clear to auscultation bilaterally without wheezes or crackles Cardiovascular: Irregular irregular rhythm and rate, negative murmur gallop or rub normal S1 and S2 Abdomen: negative abdominal pain, nondistended, positive soft, bowel sounds, no rebound, no ascites, no appreciable mass Extremities: positive LLE cyanosis, cold to touch, nonpalpable pulse. RLE WNL Skin: Negative rashes, lesions, ulcers Psychiatric:  Unable to evaluate  Central nervous system:  Response to heavy painful stimuli Discharge Instructions     Medication List    STOP taking these medications   ALLERGY RELIEF 10 MG tablet Generic drug:  loratadine   ALPRAZolam 0.25 MG tablet Commonly known as:  XANAX   calcium-vitamin D 500-200 MG-UNIT tablet Commonly known as:  OSCAL WITH D   diazepam 5 MG tablet Commonly known as:  VALIUM Replaced by:  diazepam 1 MG/ML solution   HYDROcodone-acetaminophen 5-325 MG tablet Commonly known as:  NORCO/VICODIN   ibuprofen 200 MG tablet Commonly known as:  ADVIL,MOTRIN   insulin lispro 100 UNIT/ML injection Commonly known as:  HUMALOG   lisinopril 5 MG tablet Commonly known as:  PRINIVIL,ZESTRIL   metFORMIN 500 MG tablet Commonly known as:  GLUCOPHAGE   metoprolol succinate 25 MG 24 hr tablet Commonly known as:  TOPROL-XL   oxyCODONE-acetaminophen 5-325  MG tablet Commonly known as:  PERCOCET/ROXICET     TAKE these medications   cefTRIAXone 1 g in dextrose 5 % 50 mL Inject 1 g into the vein daily. Start taking on:  01/19/2016   diazepam 1 MG/ML solution Commonly known as:  VALIUM Place 1 mL (1 mg total) into feeding tube every 8 (eight) hours as needed for anxiety or muscle spasms. Replaces:  diazepam 5 MG tablet   diltiazem 10 mg/ml oral suspension Commonly known as:  CARDIZEM Place 3 mLs (30 mg total) into feeding tube every 6 (six) hours.   docusate sodium 100 MG capsule Commonly known as:  COLACE Take 1 capsule (100 mg total) by mouth every 12 (twelve) hours.   enoxaparin 30 MG/0.3ML injection Commonly known as:  LOVENOX Inject 0.9 mLs (90 mg total) into the skin every 12 (twelve) hours.   feeding supplement (JEVITY 1.2 CAL) Liqd Place 1,000 mLs into feeding tube continuous. Goal rate 33ml/hr   feeding supplement (PRO-STAT SUGAR FREE 64) Liqd Place 30 mLs into feeding tube 2 (two) times daily. What changed:  how  to take this  when to take this   free water Soln Place 100 mLs into feeding tube every 8 (eight) hours.   insulin aspart 100 UNIT/ML injection Commonly known as:  novoLOG Inject 0-9 Units into the skin every 4 (four) hours.   insulin glargine 100 UNIT/ML injection Commonly known as:  LANTUS Inject 0.1 mLs (10 Units total) into the skin at bedtime. What changed:  how much to take  when to take this  additional instructions   morphine CONCENTRATE 10 MG/0.5ML Soln concentrated solution Place 0.25 mLs (5 mg total) into feeding tube every 4 (four) hours as needed for severe pain.   ondansetron 4 MG tablet Commonly known as:  ZOFRAN Place 1 tablet (4 mg total) into feeding tube every 6 (six) hours as needed for nausea.   oxyCODONE 5 MG/5ML solution Commonly known as:  ROXICODONE Place 5 mLs (5 mg total) into feeding tube every 4 (four) hours.   polyethylene glycol packet Commonly known as:   MIRALAX Place 17 g into feeding tube daily. What changed:  how to take this   vitamin C 500 MG tablet Commonly known as:  ASCORBIC ACID Take 500 mg by mouth daily.   zinc sulfate 220 (50 Zn) MG capsule Take 220 mg by mouth daily.      Allergies  Allergen Reactions  . Other Rash    Bandaids      The results of significant diagnostics from this hospitalization (including imaging, microbiology, ancillary and laboratory) are listed below for reference.    Significant Diagnostic Studies: No results found.  Microbiology: Recent Results (from the past 240 hour(s))  Blood culture (routine x 2)     Status: None (Preliminary result)   Collection Time: 01/13/16  5:15 PM  Result Value Ref Range Status   Specimen Description BLOOD LEFT HAND  Final   Special Requests BOTTLES DRAWN AEROBIC AND ANAEROBIC 5CC EA  Final   Culture NO GROWTH 4 DAYS  Final   Report Status PENDING  Incomplete  Urine culture     Status: Abnormal   Collection Time: 01/13/16  6:59 PM  Result Value Ref Range Status   Specimen Description URINE, CATHETERIZED  Final   Special Requests NONE  Final   Culture MULTIPLE SPECIES PRESENT, SUGGEST RECOLLECTION (A)  Final   Report Status 01/14/2016 FINAL  Final  Blood culture (routine x 2)     Status: None (Preliminary result)   Collection Time: 01/13/16 10:56 PM  Result Value Ref Range Status   Specimen Description BLOOD RIGHT HAND  Final   Special Requests IN PEDIATRIC BOTTLE 5CC  Final   Culture NO GROWTH 4 DAYS  Final   Report Status PENDING  Incomplete  MRSA PCR Screening     Status: Abnormal   Collection Time: 01/14/16  6:51 AM  Result Value Ref Range Status   MRSA by PCR POSITIVE (A) NEGATIVE Final    Comment:        The GeneXpert MRSA Assay (FDA approved for NASAL specimens only), is one component of a comprehensive MRSA colonization surveillance program. It is not intended to diagnose MRSA infection nor to guide or monitor treatment for MRSA  infections. RESULT CALLED TO, READ BACK BY AND VERIFIED WITH:  S. ADAMS [id number] 07.23.17 N. MORRIS   Culture, Urine     Status: Abnormal   Collection Time: 01/15/16 12:41 PM  Result Value Ref Range Status   Specimen Description URINE, CATHETERIZED  Final   Special Requests rocephin and  vancomycin Normal  Final   Culture MULTIPLE SPECIES PRESENT, SUGGEST RECOLLECTION (A)  Final   Report Status 01/16/2016 FINAL  Final     Labs: Basic Metabolic Panel:  Recent Labs Lab 01/13/16 1715 01/14/16 0536 01/15/16 0234 01/16/16 0219  NA 137 138 140 138  K 4.5 4.0 3.7 3.9  CL 97* 103 108 105  CO2 30 27 25 23   GLUCOSE 103* 154* 82 97  BUN 43* 32* 19 10  CREATININE 0.76 0.81 0.65 0.60  CALCIUM 9.4 8.7* 8.2* 8.3*  MG  --  1.9  --   --   PHOS  --  3.7  --  3.1   Liver Function Tests:  Recent Labs Lab 01/14/16 0536 01/15/16 0234 01/16/16 0219  AST 23 24  --   ALT 10* 8*  --   ALKPHOS 60 56  --   BILITOT 0.6 0.6  --   PROT 6.4* 5.9*  --   ALBUMIN 2.3* 2.1* 2.1*   No results for input(s): LIPASE, AMYLASE in the last 168 hours. No results for input(s): AMMONIA in the last 168 hours. CBC:  Recent Labs Lab 01/13/16 1715 01/14/16 0536 01/15/16 0234 01/16/16 0219 01/17/16 0216 01/18/16 0556  WBC 14.8* 15.2* 15.3* 14.8* 13.6* 11.8*  NEUTROABS 9.5*  --   --   --   --   --   HGB 10.9* 11.3* 9.9* 11.3* 11.5* 10.3*  HCT 35.5* 37.2 32.7* 36.8 37.7 33.3*  MCV 96.5 96.1 96.2 96.8 96.4 94.3  PLT 522* 510* 503* 569* 519* 516*   Cardiac Enzymes:  Recent Labs Lab 01/13/16 2256 01/14/16 0530 01/14/16 1104  TROPONINI <0.03 0.04* 0.06*   BNP: BNP (last 3 results) No results for input(s): BNP in the last 8760 hours.  ProBNP (last 3 results) No results for input(s): PROBNP in the last 8760 hours.  CBG:  Recent Labs Lab 01/17/16 2032 01/17/16 2348 01/18/16 0428 01/18/16 0846 01/18/16 1301  GLUCAP 135* 161* 199* 215* 199*       Signed:  Dia Crawford, MD [facility] [phone] pager

## 2016-01-18 NOTE — NC FL2 (Signed)
[place] LEVEL OF CARE SCREENING TOOL     IDENTIFICATION  Patient Name: Yolanda Solis Birthdate: 1934/05/26 Sex: female Admission Date (Current Location): 01/13/2016  [facility] and [place] Number:  [employer] and Address:  [facility] [place]. [facility] [place] [address], [place], [place] [address]      Provider Number: [id number]  Attending Physician Name and Address:  Allie Bossier, MD  Relative Name and Phone Number:  Manon Hilding - son.  Phone [phone]    Current Level of Care: Hospital Recommended Level of Care: [facility]) Prior Approval Number:    Date Approved/Denied:   PASRR Number:    Discharge Plan: SNF    Current Diagnoses: Patient Active Problem List   Diagnosis Date Noted  . Palliative care encounter   . Limb ischemia   . Sepsis, unspecified organism ([place])   . Acute cystitis without hematuria   . Chronic atrial fibrillation ([place])   . Ischemic pain of foot   . Closed wedge compression fracture of tenth thoracic vertebra (HCC)   . Uncontrolled type 2 diabetes mellitus with complication ([place])   . Neurogenic bowel   . Neurogenic bladder   . Pressure ulcer 01/14/2016  . Urinary tract infectious disease 01/13/2016  . Atrial fibrillation with RVR ([place]) 01/13/2016  . Atrial fibrillation with rapid ventricular response ([place]) 01/13/2016  . DM (diabetes mellitus) ([place]) 01/13/2016  . Right hemiplegia ([place]) 01/13/2016  . SCI (spinal cord injury) 02/23/2014  . Fracture dislocation of thoracic spine ([place]) 02/18/2014  . HTN (hypertension) 02/18/2014  . DM type 2 (diabetes mellitus, type 2) ([place]) 02/18/2014  . Fracture of thoracic spine ([place]) 02/17/2014    Orientation RESPIRATION BLADDER Height & Weight     Self, Situation, Place  O2 (2L O2) Incontinent (Chronic foley and neurogenic bladder) Weight: 190 lb 11.2 oz (86.5 kg) Height:  5\' 2"  (157.5 cm)  BEHAVIORAL SYMPTOMS/MOOD NEUROLOGICAL BOWEL  NUTRITION STATUS      Incontinent (neurogenic bowel) Feeding tube (PEG tube)  AMBULATORY STATUS COMMUNICATION OF NEEDS Skin   Total Care (Paraplegia) Verbally Other (Comment) (Stage 3 PU to left ankle; deep tissue injury to left foot)                       Personal Care Assistance Level of Assistance  Bathing, Feeding, Dressing Bathing Assistance: Maximum assistance Feeding assistance: Limited assistance (PEG Tube) Dressing Assistance: Maximum assistance     Functional Limitations Info  Sight, Hearing, Speech Sight Info: Adequate Hearing Info: Adequate Speech Info: Adequate    SPECIAL CARE FACTORS FREQUENCY                       Contractures Contractures Info: Not present    Additional Factors Info  Insulin Sliding Scale, Code Status, Allergies Code Status Info: DNR Allergies Info: Bandaids   Insulin Sliding Scale Info: 0-9 Units every 4 hours       Current Medications (01/18/2016):  This is the current hospital active medication list Current Facility-Administered Medications  Medication Dose Route Frequency Provider Last Rate Last Dose  . 0.9 %  sodium chloride infusion   Intravenous Continuous Cherene Altes, MD   Stopped at 01/17/16 0600  . acetaminophen (TYLENOL) tablet 650 mg  650 mg Per Tube Q6H PRN Cherene Altes, MD       Or  . acetaminophen (TYLENOL) suppository 650 mg  650 mg Rectal Q6H PRN  Cherene Altes, MD      . cefTRIAXone (ROCEPHIN) 1 g in dextrose 5 % 50 mL IVPB  1 g Intravenous Q24H Toy Baker, MD   1 g at 01/17/16 2122  . Chlorhexidine Gluconate Cloth 2 % PADS 6 each  6 each Topical Q0600 Cherene Altes, MD   6 each at 01/18/16 [phone]  . diazepam (VALIUM) 1 MG/ML solution 1 mg  1 mg Per Tube [id number] PRN Pershing Proud, NP      . diltiazem (CARDIZEM) 10 mg/ml oral suspension 30 mg  30 mg Per Tube Q6H Cherene Altes, MD   30 mg at 01/18/16 1259  . enoxaparin (LOVENOX) injection 90 mg  90 mg Subcutaneous Q12H Romona Curls, [facility]       . feeding supplement (JEVITY 1.2 CAL) liquid 1,000 mL  1,000 mL Per Tube Continuous Allie Bossier, MD 50 mL/hr at 01/18/16 0600 1,000 mL at 01/18/16 0600  . feeding supplement (PRO-STAT SUGAR FREE 64) liquid 30 mL  30 mL Per Tube BID Allie Bossier, MD   30 mL at 01/18/16 0850  . free water 100 mL  100 mL Per Tube Q8H Cherene Altes, MD   100 mL at 01/18/16 1400  . insulin aspart (novoLOG) injection 0-9 Units  0-9 Units Subcutaneous Q4H Toy Baker, MD   2 Units at 01/18/16 1300  . insulin glargine (LANTUS) injection 10 Units  10 Units Subcutaneous QHS Cherene Altes, MD   10 Units at 01/17/16 2121  . morphine CONCENTRATE 10 MG/0.5ML oral solution 5 mg  5 mg Per Tube Q4H PRN Allie Bossier, MD      . mupirocin ointment (BACTROBAN) 2 % 1 application  1 application Nasal BID Cherene Altes, MD   1 application at [id number]  . ondansetron (ZOFRAN) tablet 4 mg  4 mg Oral Q6H PRN Toy Baker, MD       Or  . ondansetron (ZOFRAN) injection 4 mg  4 mg Intravenous Q6H PRN Toy Baker, MD      . oxyCODONE (ROXICODONE) 5 MG/5ML solution 5 mg  5 mg Per Tube [id number] Pershing Proud, NP   5 mg at 01/18/16 1259  . sodium chloride flush (NS) 0.9 % injection 3 mL  3 mL Intravenous Q12H Toy Baker, MD   3 mL at 01/18/16 0850     Discharge Medications: Please see discharge summary for a list of discharge medications.  Relevant Imaging Results:  Relevant Lab Results:   Additional Information ss#[id number].  Sable Feil, LCSW

## 2016-01-18 NOTE — Clinical Social Work Note (Addendum)
Yolanda Solis is being discharged back to [facility] via ambulance today. [place] admissions staff person, Lattie Haw contacted and informed of discharge. CSW was advised that she is daughter-in-law to patient and will inform her husband (patient's son) of discharge. She is in agreement with ambulance transport.  Patient's nurse provided with facility phone number to call report. Discharge clinicals transmitted to facility.  Leonila Speranza Givens, MSW, LCSW Licensed Clinical Social Worker [place] [phone]

## 2016-01-18 NOTE — Progress Notes (Signed)
Daily Progress Note   Patient Name: Yolanda Solis       Date: 01/18/2016 DOB: Dec 30, 1933  Age: 82 y.o. MRN#: [id number] Attending Physician: Allie Bossier, MD Primary Care Physician: Glo Herring., MD Admit Date: 01/13/2016  Reason for Consultation/Follow-up: Establishing goals of care, symptom managment  Subjective: No family at bedside. Ms. Sontag continues to yell out intermittently and has required numerous prn medications for pain management. Reviewed prn needs and will make recommendations for better management. Family agreeable to hospice, no surgery desired - discussed with Dr. Sherral Hammers. Staffed with Ihor Dow, NP.   Length of Stay: 4  Current Medications: Scheduled Meds:  . cefTRIAXone (ROCEPHIN)  IV  1 g Intravenous Q24H  . Chlorhexidine Gluconate Cloth  6 each Topical Q0600  . diltiazem  30 mg Per Tube Q6H  . enoxaparin (LOVENOX) injection  90 mg Subcutaneous Q12H  . feeding supplement (PRO-STAT SUGAR FREE 64)  30 mL Per Tube BID  . free water  100 mL Per Tube Q8H  . insulin aspart  0-9 Units Subcutaneous Q4H  . insulin glargine  10 Units Subcutaneous QHS  . mupirocin ointment  1 application Nasal BID  . oxyCODONE  5 mg Per Tube Q4H  . sodium chloride flush  3 mL Intravenous Q12H    Continuous Infusions: . sodium chloride Stopped (01/17/16 0600)  . feeding supplement (JEVITY 1.2 CAL) 1,000 mL (01/18/16 0600)    PRN Meds: acetaminophen **OR** acetaminophen, diazepam, morphine, ondansetron **OR** ondansetron (ZOFRAN) IV  Physical Exam  Constitutional: She appears well-developed and well-nourished. She appears lethargic.  HENT:  Head: Normocephalic and atraumatic.  Cardiovascular: Normal rate.  An irregularly irregular rhythm present.  Pulmonary/Chest: Effort  normal. No accessory muscle usage. No tachypnea. No respiratory distress.  Abdominal: Soft. Normal appearance.    Neurological: She appears lethargic.  Nonverbal, will moan and yell at times            Vital Signs: BP (!) 128/51 (BP Location: Right Arm)   Pulse 86   Temp 98 F (36.7 C)   Resp 19   Ht 5\' 2"  (1.575 m)   Wt 86.5 kg (190 lb 11.2 oz)   SpO2 100%   BMI 34.88 kg/m  SpO2: SpO2: 100 % O2 Device: O2 Device: Nasal Cannula O2 Flow Rate: O2 Flow Rate (  L/min): 2 L/min  Intake/output summary:  Intake/Output Summary (Last 24 hours) at 01/18/16 [id number] Last data filed at 01/18/16 0800  Gross per 24 hour  Intake          1032.33 ml  Output              720 ml  Net           312.33 ml   LBM: Last BM Date:  (PTA) Baseline Weight: Weight: 93 kg (205 lb) Most recent weight: Weight: 86.5 kg (190 lb 11.2 oz)       Palliative Assessment/Data: PPS: 10% baseline     Patient Active Problem List   Diagnosis Date Noted  . Palliative care encounter   . Limb ischemia   . Chronic atrial fibrillation ([place])   . Ischemic pain of left foot   . Closed wedge compression fracture of tenth thoracic vertebra (HCC)   . Uncontrolled type 2 diabetes mellitus with complication ([place])   . Neurogenic bowel   . Neurogenic bladder   . Pressure ulcer 01/14/2016  . UTI (lower urinary tract infection) 01/13/2016  . Atrial fibrillation with RVR ([place]) 01/13/2016  . Atrial fibrillation with rapid ventricular response ([place]) 01/13/2016  . DM (diabetes mellitus) ([place]) 01/13/2016  . Right hemiplegia ([place]) 01/13/2016  . SCI (spinal cord injury) 02/23/2014  . Fracture dislocation of thoracic spine ([place]) 02/18/2014  . HTN (hypertension) 02/18/2014  . DM type 2 (diabetes mellitus, type 2) ([place]) 02/18/2014  . Fracture of thoracic spine ([place]) 02/17/2014    Palliative Care Assessment & Plan   Patient Profile: 82 y.o. female  with past medical history of T10 displaced fracture with cord compression since  2015 leading to complete paraplegia, CVA in June 2017 resulting in aphasia &Right Hemiparesis, s/p PEG, Chronic Afib, DM, and HTN admitted on 01/13/2016 with cold, blue left new since the night before.   Assessment: Patient lying in bed, nonverbal - at baseline.   Recommendations/Plan:  Pain/discomfort: She is unable to describe. Total pain medication over past 24 hrs equivalent to daily dose of 50 mg of oxycodone, decreased to 30 mg to account for cross tolerance and divided into q4h dosing. OxyIR solution 5 mg every 4 hours scheduled. Morphine solution 5 mg every 4 hours prn for breakthrough pain.   Muscle spasms/anxiety: Valium 1 mg every 8 hours prn solution.   Hopeful hospice can titrate/adjust meds to comfort.   Goals of Care and Additional Recommendations:  Limitations on Scope of Treatment: Avoid Hospitalization, No Surgical Procedures and No Tracheostomy  Code Status:    Code Status Orders        Start     Ordered   01/14/16 0528  Do not attempt resuscitation (DNR)  Continuous    Question Answer Comment  In the event of cardiac or respiratory ARREST Do not call a "code blue"   In the event of cardiac or respiratory ARREST Do not perform Intubation, CPR, defibrillation or ACLS   In the event of cardiac or respiratory ARREST Use medication by any route, position, wound care, and other measures to relive pain and suffering. May use oxygen, suction and manual treatment of airway obstruction as needed for comfort.      01/14/16 0527    Code Status History    Date Active Date Inactive Code Status Order ID Comments User Context   02/23/2014  7:00 PM 03/14/2014  5:49 PM Full Code [id number]  Cathlyn Parsons, PA-C Inpatient   02/18/2014  3:25 AM 02/23/2014  7:00 PM Full Code [id number]  Erline Levine, MD Inpatient    Advance Directive Documentation   Flowsheet Row Most Recent Value  Type of Advance Directive  Out of facility DNR (pink MOST or yellow form)  Pre-existing out of  facility DNR order (yellow form or pink MOST form)  No data  "MOST" Form in Place?  No data       Prognosis:   < 6 months  Discharge Planning:  [place] with Hospice  Care plan was discussed with Dr. Sherral Hammers.   Thank you for allowing the Palliative Medicine Team to assist in the care of this patient.   Time In: 0900 Time Out: 0930 Total Time 77min Prolonged Time Billed  no       Greater than 50%  of this time was spent counseling and coordinating care related to the above assessment and plan.  Pershing Proud, NP  Please contact Palliative Medicine Team phone at [phone] for questions and concerns.

## 2016-01-18 NOTE — Progress Notes (Signed)
Report given to Kayla at [facility] [place].  ?

## 2016-01-18 NOTE — Clinical Social Work Note (Signed)
Clinical Social Work Assessment  Patient Details  Name: Yolanda Solis MRN: [id number] Date of Birth: 05-14-1934  Date of referral:  01/18/16               Reason for consult:  Facility Placement (Patient from [facility])                Permission sought to share information with:  Other (Visited room - patient asleep) Permission granted to share information::  No  Name::     Jori Moll and Atha Starks  Agency::     Relationship::  Son and daughter-in-law  Contact Information:  Jori Moll [phone] and Lattie Haw - [phone] [facility]) or [phone] (c)  Housing/Transportation Living arrangements for the past 2 months:  [facility]) Source of Information:  Other (Comment Required) (Daughter-in-law, Atha Starks) Patient Interpreter Needed:  None Criminal Activity/Legal Involvement Pertinent to Current Situation/Hospitalization:  No - Comment as needed Significant Relationships:  Adult Children, Other Family Members Lives with:  Facility Resident (Prior to going to facility, patient lived with son and daughter-in-law, Jori Moll and Atha Starks.) Do you feel safe going back to the place where you live?  Yes Need for family participation in patient care:  Yes (Comment)  Care giving concerns: Patient returning to facility to continue rehab per daughter-in-law. Per Ms. Wilson, after rehab, they will decide on continuing care plan for patient; home versus continued facility stay.  Social Worker assessment / plan: CSW talked with Atha Starks at [facility] (admissions staff person) regarding patient's readiness for discharge. CSW informed by Lattie Haw that she is daughter-in-law of patient and that patient lives with she and husband Jori Moll. Per Lattie Haw, patient will return to [facility] to continue rehab and they will decide on long-term plan for patient.  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  Patient/Family's Understanding of and Emotional Response to Diagnosis, Current Treatment, and Prognosis: Mrs. Redmond Pulling indicated that they will determine the long-term care plan for patient once rehab completed.  Emotional Assessment Appearance:  Appears stated age (CSW visited room, however patient asleep) Attitude/Demeanor/Rapport:  Unable to Assess Affect (typically observed):  Unable to Assess Orientation:  Oriented to Self, Oriented to Place, Oriented to Situation Alcohol / Substance use:  Never Used Psych involvement (Current and /or in the community):  No (Comment)  Discharge Needs  Concerns to be addressed:  Discharge Planning Concerns Readmission within the last 30 days:  No Current discharge risk:  None Barriers to Discharge:  No Barriers Identified   Sable Feil, LCSW 01/18/2016, 4:32 PM

## 2016-02-23 DEATH — deceased

## 2016-05-16 IMAGING — CR DG LUMBAR SPINE COMPLETE 4+V
5 series · 5 of 5 positions shown · non-contrast
Comparison: CT Abdomen and Pelvis 12/25/2006.

CLINICAL DATA: 79-year-old female with low back pain after a fall.
Initial encounter.

EXAM:
LUMBAR SPINE - COMPLETE 4+ VIEW

[view not recorded (1 of 5)]
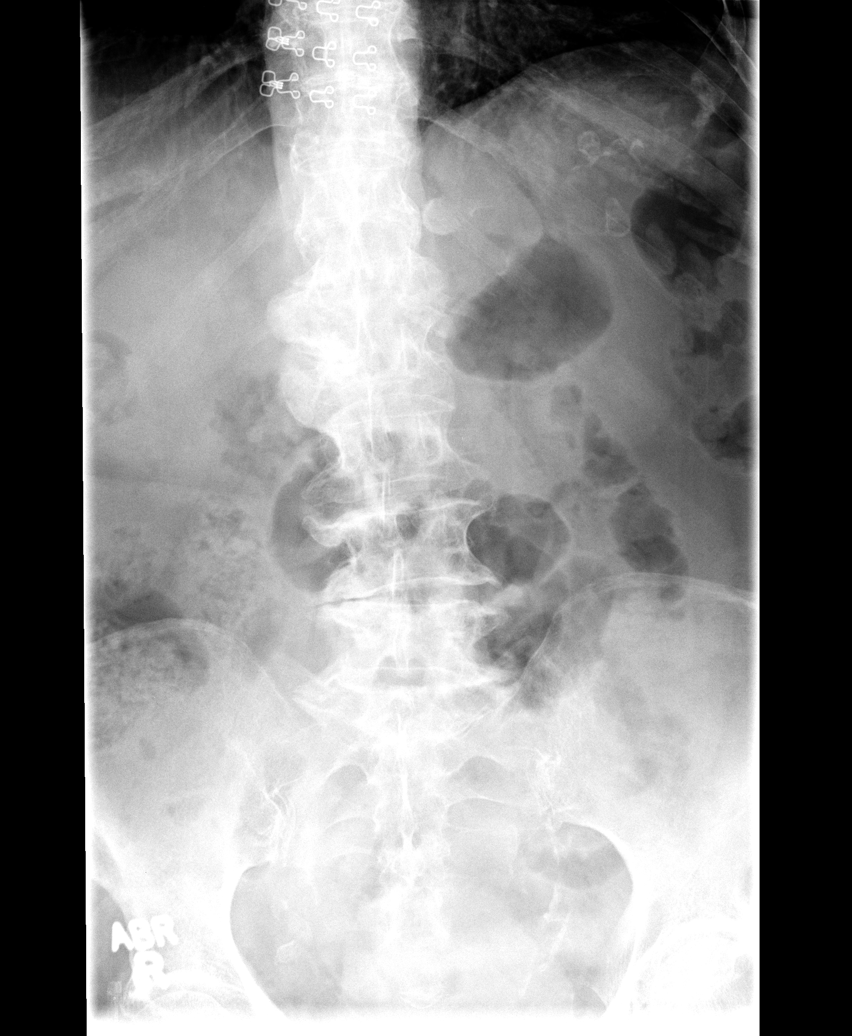

[view not recorded (2 of 5)]
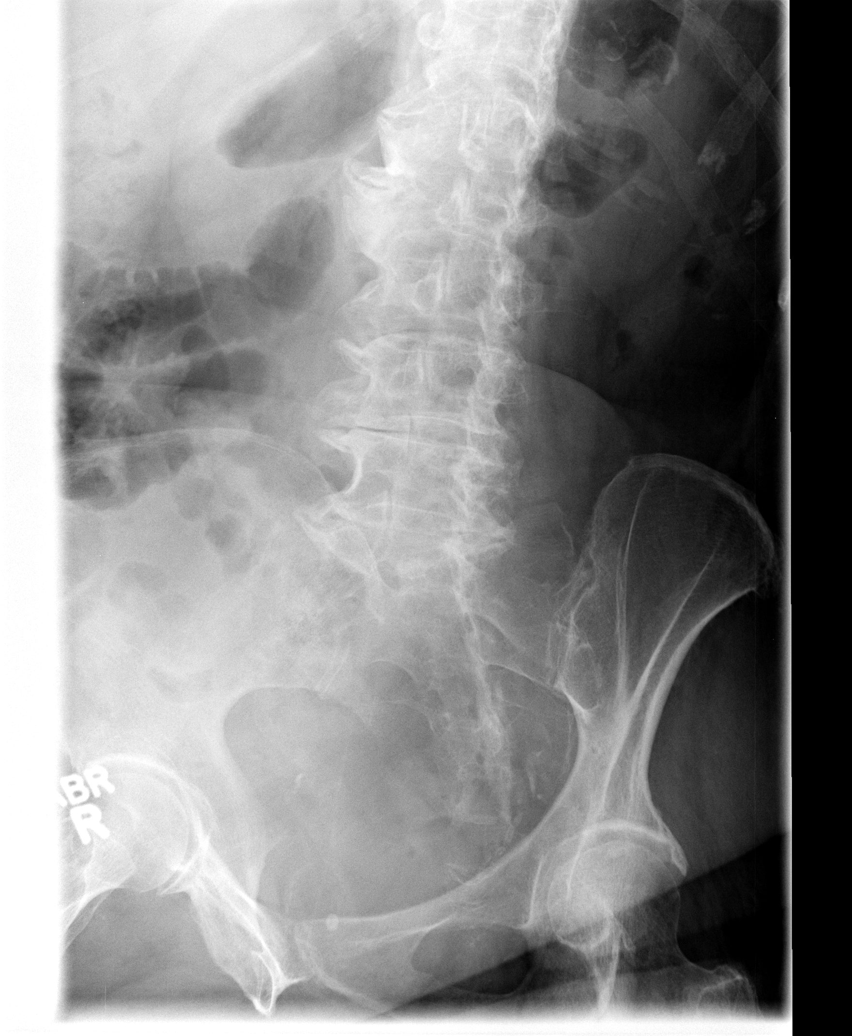

[view not recorded (3 of 5)]
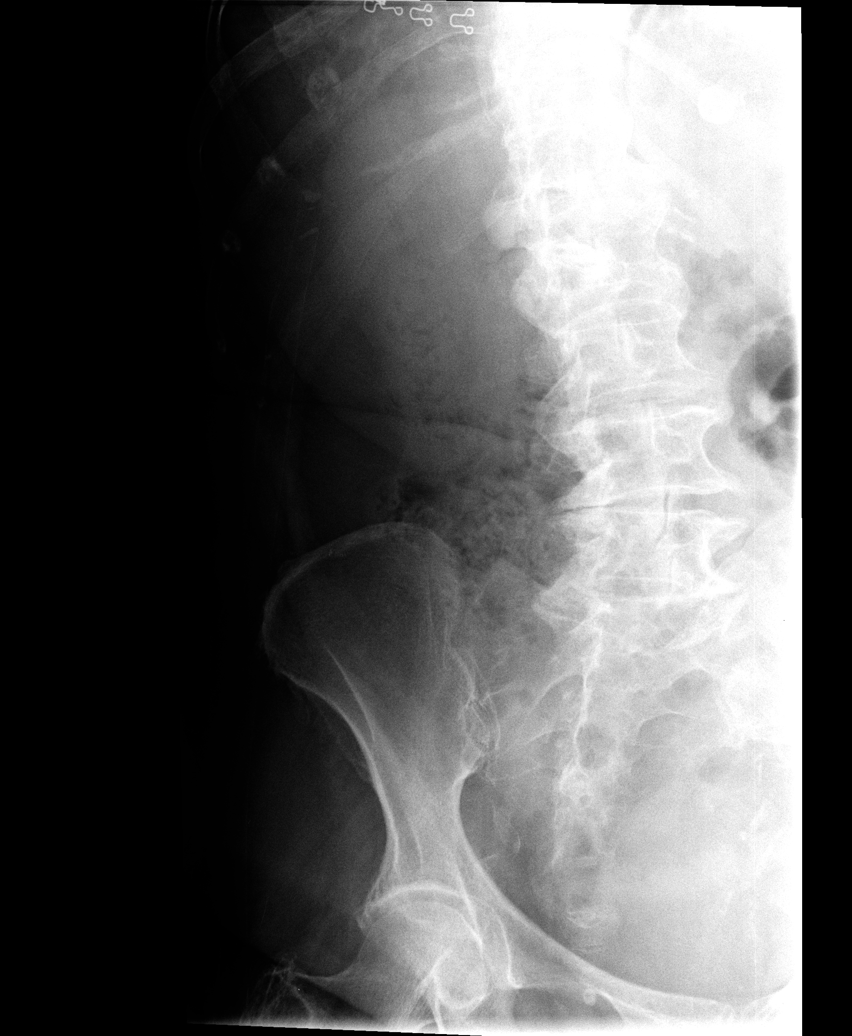

[view not recorded (4 of 5)]
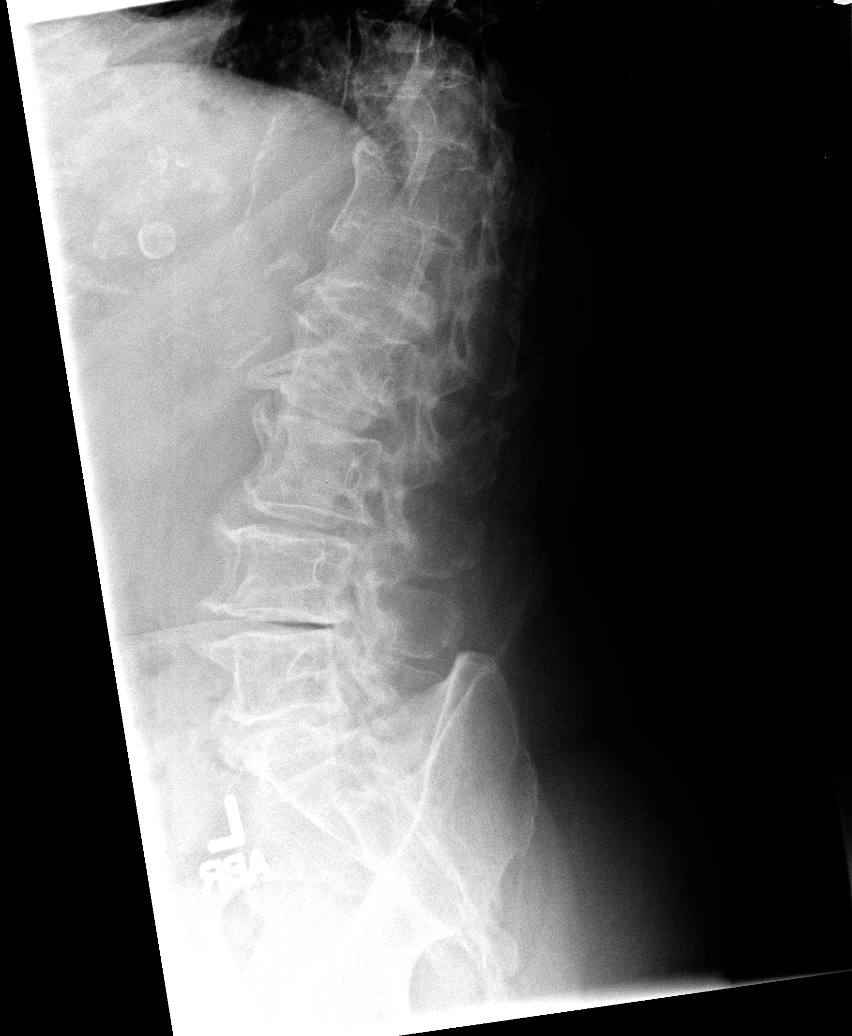

[view not recorded (5 of 5)]
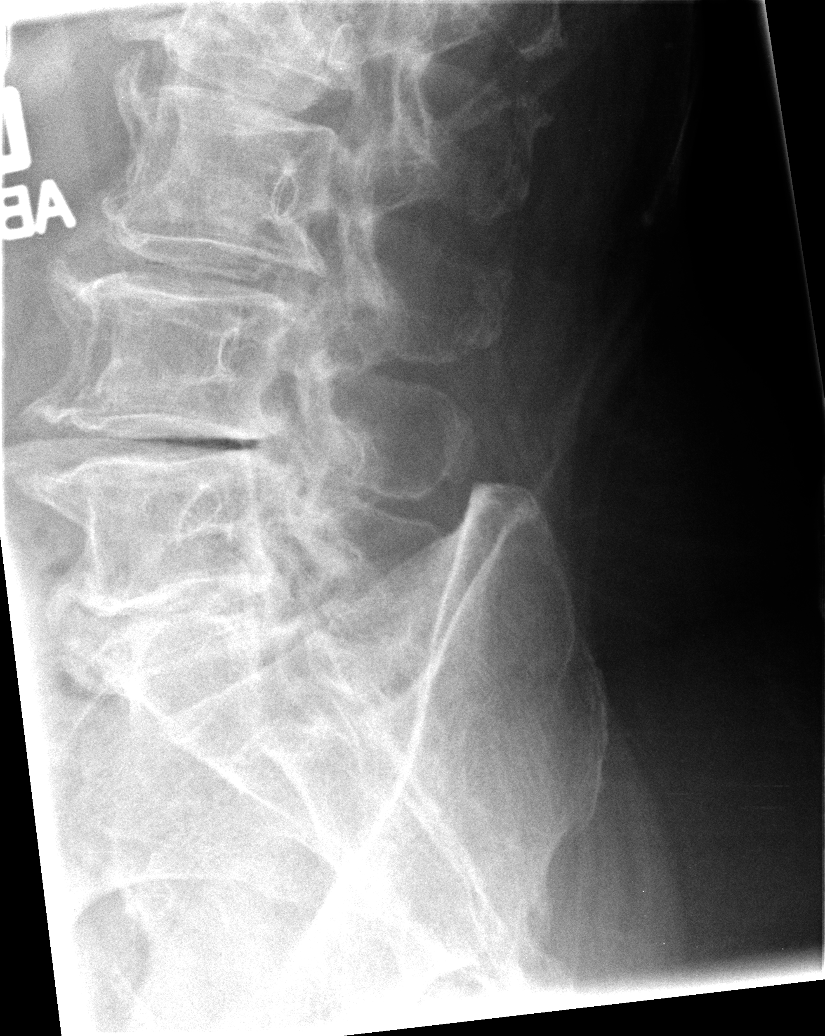

[5 of 5 positions shown; findings below may reference images not displayed]

FINDINGS: Progressed and bulky diffuse endplate spurring throughout the lumbar
spine. Normal lumbar segmentation. Chronic vacuum disc phenomena at
L4-L5. No lumbar compression fracture identified. Grossly stable and
intact visualized lower thoracic levels. Moderate lower lumbar facet
hypertrophy. No pars fracture identified. Extensive Aortoiliac
calcified atherosclerosis noted. Grossly intact sacral ala and SI
joints.
IMPRESSION: 1.  No acute fracture or listhesis identified in the lumbar spine.
2. Diffuse idiopathic skeletal hyperostosis
3. Chronically advanced L4-L5 disc degeneration.

## 2016-05-26 IMAGING — CR DG ABDOMEN 1V
1 series · 1 of 1 positions shown · non-contrast
Comparison: 02/24/2014; CT abdomen pelvis -12/25/2006

CLINICAL DATA: Abdominal pain, evaluate ileus

EXAM:
ABDOMEN - 1 VIEW

[x abdomen supine]
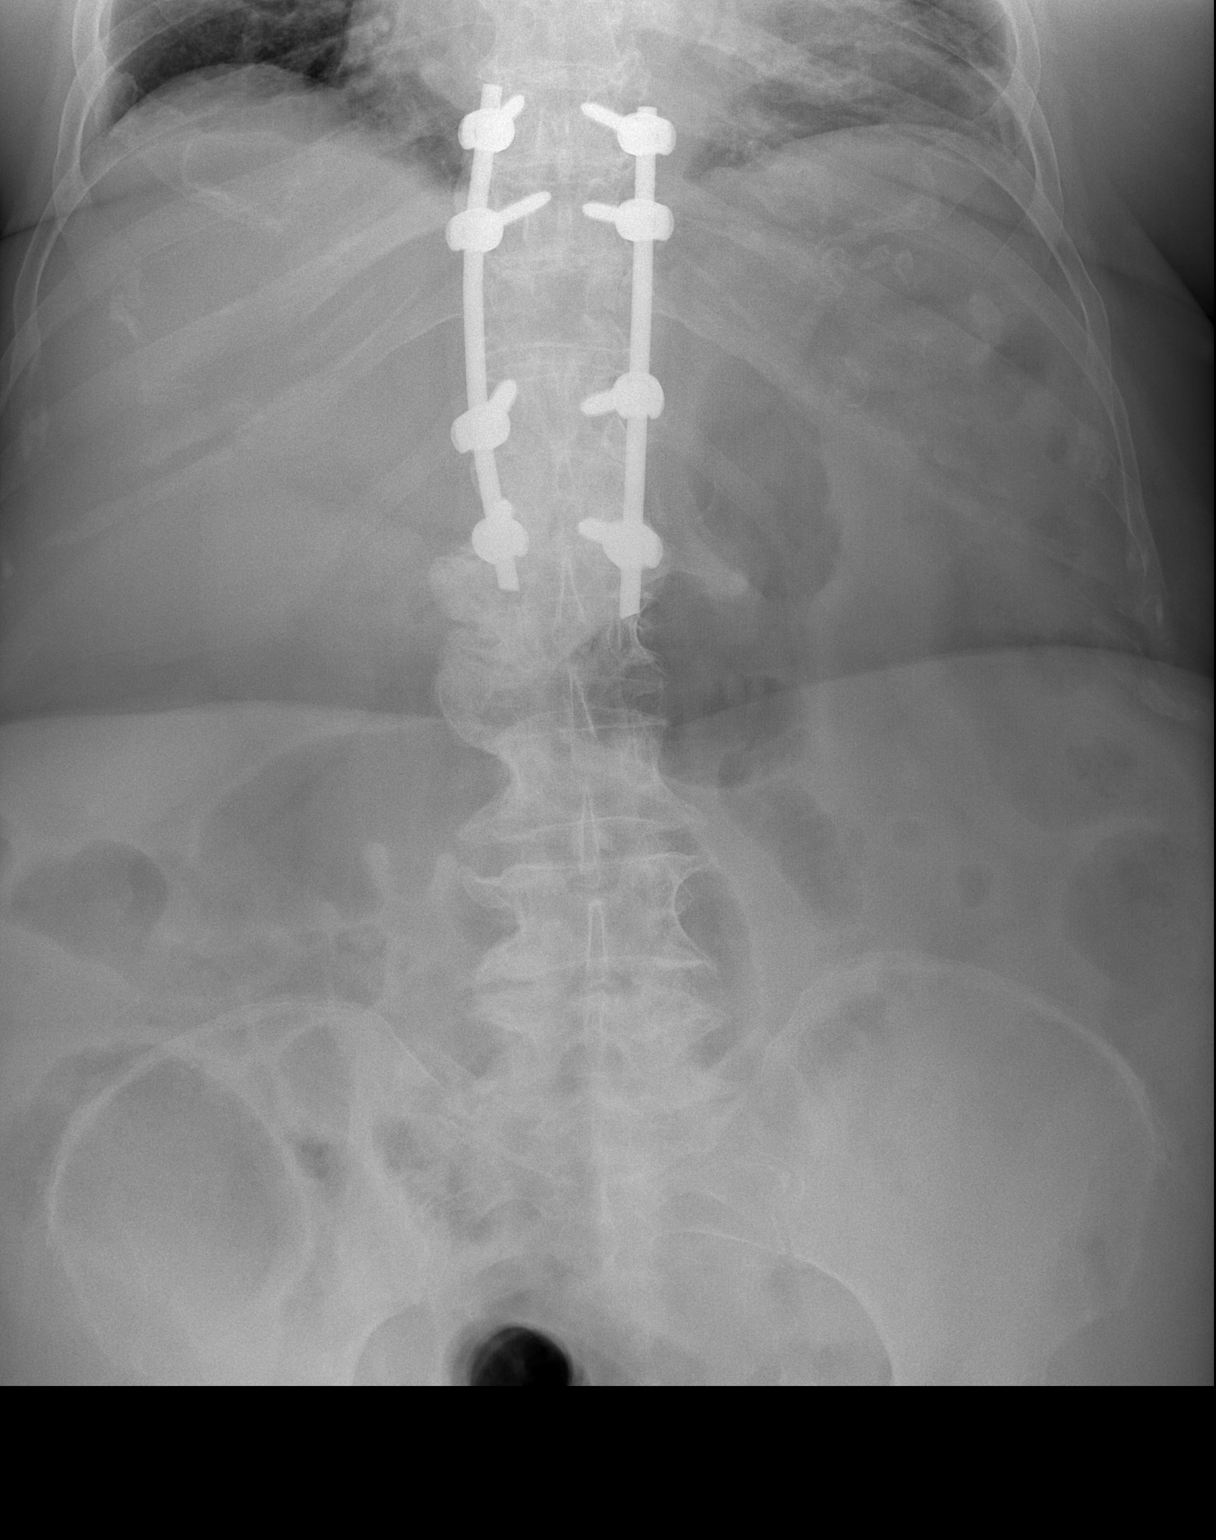

[1 of 1 positions shown; findings below may reference images not displayed]

FINDINGS: Nonobstructive bowel gas pattern.

No supine evidence of pneumoperitoneum. No pneumatosis or portal
venous gas.

No definite abnormal intra-abdominal calcifications.

Limited visualization the lower thorax demonstrates minimal
bibasilar opacities, left greater than right.

Post lower thoracic/ upper lumbar paraspinal fusion, incompletely
evaluated.
IMPRESSION: Nonobstructive bowel gas pattern.
